# Patient Record
Sex: Female | Born: 1945
Health system: Southern US, Community
[De-identification: ages and names within clinical notes are randomized; demographics above are authoritative.]

## PROBLEM LIST (undated history)

## (undated) DIAGNOSIS — H269 Unspecified cataract: Secondary | ICD-10-CM

## (undated) DIAGNOSIS — A389 Scarlet fever, uncomplicated: Secondary | ICD-10-CM

## (undated) DIAGNOSIS — E039 Hypothyroidism, unspecified: Secondary | ICD-10-CM

## (undated) DIAGNOSIS — E785 Hyperlipidemia, unspecified: Secondary | ICD-10-CM

## (undated) DIAGNOSIS — I10 Essential (primary) hypertension: Secondary | ICD-10-CM

## (undated) DIAGNOSIS — I639 Cerebral infarction, unspecified: Secondary | ICD-10-CM

## (undated) HISTORY — PX: TUBAL LIGATION: SHX77

## (undated) HISTORY — PX: EYE SURGERY: SHX253

## (undated) HISTORY — DX: Unspecified cataract: H26.9

## (undated) HISTORY — DX: Essential (primary) hypertension: I10

## (undated) HISTORY — DX: Scarlet fever, uncomplicated: A38.9

## (undated) HISTORY — DX: Hyperlipidemia, unspecified: E78.5

## (undated) HISTORY — DX: Hypothyroidism, unspecified: E03.9

## (undated) HISTORY — DX: Cerebral infarction, unspecified: I63.9

---

## 1973-08-10 HISTORY — PX: BREAST SURGERY: SHX581

## 1976-08-10 HISTORY — PX: BREAST CYST ASPIRATION: SHX578

## 2008-01-11 ENCOUNTER — Inpatient Hospital Stay: Payer: Self-pay | Admitting: Internal Medicine

## 2008-01-11 ENCOUNTER — Other Ambulatory Visit: Payer: Self-pay

## 2008-01-24 ENCOUNTER — Ambulatory Visit: Payer: Self-pay | Admitting: Internal Medicine

## 2008-01-30 ENCOUNTER — Encounter: Payer: Self-pay | Admitting: Internal Medicine

## 2008-02-02 ENCOUNTER — Ambulatory Visit: Payer: Self-pay | Admitting: Internal Medicine

## 2008-02-08 ENCOUNTER — Encounter: Payer: Self-pay | Admitting: Internal Medicine

## 2008-04-24 ENCOUNTER — Ambulatory Visit: Payer: Self-pay | Admitting: Internal Medicine

## 2008-08-08 ENCOUNTER — Ambulatory Visit: Payer: Self-pay | Admitting: Internal Medicine

## 2008-09-18 ENCOUNTER — Ambulatory Visit: Payer: Self-pay | Admitting: Internal Medicine

## 2009-01-08 DIAGNOSIS — I639 Cerebral infarction, unspecified: Secondary | ICD-10-CM

## 2009-01-08 HISTORY — DX: Cerebral infarction, unspecified: I63.9

## 2009-02-05 ENCOUNTER — Other Ambulatory Visit: Payer: Self-pay | Admitting: Internal Medicine

## 2009-02-05 ENCOUNTER — Ambulatory Visit: Payer: Self-pay | Admitting: Internal Medicine

## 2010-01-08 LAB — HM MAMMOGRAPHY: HM Mammogram: NEGATIVE

## 2010-01-27 ENCOUNTER — Ambulatory Visit: Payer: Self-pay | Admitting: Internal Medicine

## 2010-02-18 ENCOUNTER — Ambulatory Visit: Payer: Self-pay | Admitting: Internal Medicine

## 2010-03-06 ENCOUNTER — Ambulatory Visit: Payer: Self-pay | Admitting: Internal Medicine

## 2010-07-29 ENCOUNTER — Other Ambulatory Visit: Payer: Self-pay | Admitting: Internal Medicine

## 2011-01-26 ENCOUNTER — Other Ambulatory Visit: Payer: Self-pay | Admitting: Internal Medicine

## 2011-01-26 LAB — HM PAP SMEAR: HM Pap smear: NORMAL

## 2011-03-03 ENCOUNTER — Ambulatory Visit: Payer: Self-pay | Admitting: Internal Medicine

## 2011-04-29 ENCOUNTER — Other Ambulatory Visit: Payer: Self-pay | Admitting: Internal Medicine

## 2011-04-30 ENCOUNTER — Other Ambulatory Visit: Payer: Self-pay | Admitting: *Deleted

## 2011-05-01 MED ORDER — LEVOTHYROXINE SODIUM 50 MCG PO CAPS: 50.0000 ug | ORAL_CAPSULE | Freq: Every day | ORAL | Status: DC

## 2011-05-01 MED ORDER — LEVOTHYROXINE SODIUM 50 MCG PO TABS: 50.0000 ug | ORAL_TABLET | Freq: Every day | ORAL | Status: DC

## 2011-07-08 ENCOUNTER — Other Ambulatory Visit: Payer: Self-pay | Admitting: Internal Medicine

## 2011-07-08 MED ORDER — ATORVASTATIN CALCIUM 20 MG PO TABS: 20.0000 mg | ORAL_TABLET | Freq: Every day | ORAL | Status: DC

## 2011-10-02 ENCOUNTER — Telehealth: Payer: Self-pay | Admitting: Internal Medicine

## 2011-10-02 MED ORDER — LEVOTHYROXINE SODIUM 50 MCG PO TABS: 50.0000 ug | ORAL_TABLET | Freq: Every day | ORAL | Status: DC

## 2011-10-02 NOTE — Telephone Encounter (Signed)
[phone] Pt came in and wanted to get refill on her synthroid 50 mic 1 daily [facility] Pt will call back to schedule an appointment

## 2011-10-02 NOTE — Telephone Encounter (Signed)
Patient notified. She is okay to wait until Monday rx sent to pharmacy.

## 2011-10-02 NOTE — Telephone Encounter (Signed)
The [facility] pharmacy is closed now, isn't it?   Please ask her to choose another pharmacy for the  Refill if she needs it today  And call her in Synthroid 50 mcg one  tablet daily  #30

## 2011-10-23 ENCOUNTER — Encounter: Payer: Self-pay | Admitting: Internal Medicine

## 2011-10-23 ENCOUNTER — Ambulatory Visit (INDEPENDENT_AMBULATORY_CARE_PROVIDER_SITE_OTHER): Payer: PRIVATE HEALTH INSURANCE | Admitting: Internal Medicine

## 2011-10-23 VITALS — BP 138/70 | HR 80 | Temp 97.9°F | Resp 16 | Ht 65.5 in | Wt 194.5 lb

## 2011-10-23 DIAGNOSIS — E785 Hyperlipidemia, unspecified: Secondary | ICD-10-CM

## 2011-10-23 DIAGNOSIS — I10 Essential (primary) hypertension: Secondary | ICD-10-CM

## 2011-10-23 DIAGNOSIS — G8929 Other chronic pain: Secondary | ICD-10-CM | POA: Insufficient documentation

## 2011-10-23 DIAGNOSIS — M25559 Pain in unspecified hip: Secondary | ICD-10-CM

## 2011-10-23 DIAGNOSIS — M25562 Pain in left knee: Secondary | ICD-10-CM | POA: Insufficient documentation

## 2011-10-23 MED ORDER — LISINOPRIL 40 MG PO TABS: 40.0000 mg | ORAL_TABLET | Freq: Every day | ORAL | Status: DC

## 2011-10-23 MED ORDER — ATORVASTATIN CALCIUM 20 MG PO TABS: 20.0000 mg | ORAL_TABLET | Freq: Every day | ORAL | Status: DC

## 2011-10-23 NOTE — Progress Notes (Deleted)
  Subjective:    Patient ID: Alexandra Copeland, female    DOB: 1945-08-13, 66 y.o.   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 A  Cc bilalteral hp and knee pain, prior right hip and left knee trauma but recent fall on Jan 1  with right elbow and left knee trauma, both  xrayed and normal but pain ws radiating down left leg , now resolved.      Review of Systems     Objective:   Physical Exam        Assessment & Plan:

## 2011-10-23 NOTE — Assessment & Plan Note (Addendum)
aggaravated by statin and weight gain.  Increase joint juice to  Bid ,  Suspend lipitor,  Review carotid dopplers and ECHO  in 2009 sicne CVA was due to hypertensive urgency.  If her pain is alleviated we will resume Lipitor at the pain returns we will attribute the pain to use a statin therapy. We will then need to discuss  the risks and benefits of discontinuing this based on repeat lipid panel off of statins.

## 2011-10-23 NOTE — Patient Instructions (Signed)
Consider the Low Glycemic Index Diet and 6 smaller meals daily :   7 AM Low carbohydrate Protein  Shakes (EAS Carb Control  Or Atkins ,  Available everywhere,   In  cases at BJs )  2.5 carbs  (Add or substitute a toasted sandwhich thin w/ peanut butter)  10 AM: Protein bar by Atkins (snack size,  Chocolate lover's variety at  BJ's)    Lunch: sandwich on pita bread or flatbread (Joseph's makes a pita bread and a flat bread , available at [employer] and BJ's; Toufayah makes a low carb flatbread available at [employer] and [employer])   3 PM:  Mid day :  Another protein bar,  Or a  cheese stick, 1/4 cup of almonds, walnuts, pistachios, pecans, peanuts,  Macadamia nuts  6 PM  Dinner:  "mean and green:"  Meat/chicken/fish, salad, and green veggie : use ranch, vinagrette,  Blue cheese, etc  9 PM snack : Breyer's low carb fudgiscle or  ice cream bar (Carb Smart) Weight Watcher's ice cream bar , or another protein shake  ------------------------------------------  Suspend the lipitor for 6 weeks. And increase joint juice to twice daily  If the joints are better,  We will consider an alternative statin.  If no better,  Resume lipitor

## 2011-10-25 ENCOUNTER — Encounter: Payer: Self-pay | Admitting: Internal Medicine

## 2011-10-25 DIAGNOSIS — E785 Hyperlipidemia, unspecified: Secondary | ICD-10-CM | POA: Insufficient documentation

## 2011-10-25 DIAGNOSIS — E1169 Type 2 diabetes mellitus with other specified complication: Secondary | ICD-10-CM | POA: Insufficient documentation

## 2011-10-25 DIAGNOSIS — I10 Essential (primary) hypertension: Secondary | ICD-10-CM | POA: Insufficient documentation

## 2011-10-25 NOTE — Assessment & Plan Note (Signed)
She has been taking Lipitor since her stroke. Her stroke  was due to heart hypertensive urgency, so  her LDL goal would be between 70 and 100, but she does wonder whether her car chronic joint pains are aggravated by statin therapy. See discussion below.

## 2011-10-25 NOTE — Progress Notes (Signed)
Patient ID: Alexandra Copeland, female   DOB: 03-15-1946, 66 y.o.   MRN: [id number]  Patient Active Problem List  Diagnoses  . Chronic arthralgias of knees and hips  . Hypertension  . Hyperlipidemia  . Stroke    Subjective:  CC:  Joint pain, 6 month followup  Chief Complaint  Patient presents with  . Annual Exam    HPI:   Alexandra Copeland a 66 y.o. female who presents for six-month followup on  chronic tobacco abuse, hypertension hyperlipidemia hypothyroidism and osteoarthritis. She was last seen 9 months ago at her annual exam at which time a Pap smear was done and a mammogram was ordered.  Both were normal.   in the interim she has had no trouble managing her hypertension and hyperlpidemia with current medications. She has had no subsequent strokes since her stroke in 2010. 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 She had a traumatic fall in January while playing with her granddaughter. The fall occurred while running down a snowy hill. Her fall was broken by a parked car she skinned up her left knee and bumped her right hip. Both joints were very tender with decreased range of motion.  both joints were x-rayed upon her return home and were negative for fractures or any joint derangement necessitating orthopedic followup. She had pain and limited range of motion for several weeks but the symptoms are now resolved. She h she does have chronic right hip pain since childhood which she tolerates with occasional use of a proton .  She has had no others issues specifically no episodes of chest pain palpitations dizziness orthopnea low back pain trouble sleeping, or mood changes.       Review of Systems:  Pertinent review of systems addressed in the HPI,    The following portions of the patient's history were reviewed and updated as appropriate:  Allergies, current medications, and problem list.  Past Medical History  Diagnosis Date  . Scarlet fever     as a child  . Hypertension   . Hyperlipidemia   . Stroke June 2010    left parietal hypertensive  . Hypothyroid    Current Outpatient Prescriptions on File Prior to Visit  Medication Sig Dispense Refill  . levothyroxine (SYNTHROID) 50 MCG tablet Take 1 tablet (50 mcg total) by mouth daily.  30 tablet  3   History   Social History  . Marital Status: Single    Spouse Name: N/A    Number of Children: N/A  . Years of Education: N/A   Occupational History  . Not on file.   Social History Main Topics  . Smoking status: Current Some Day Smoker    Types: Cigarettes    Last Attempt to Quit: 01/07/2009  . Smokeless tobacco: Never Used  . Alcohol Use: 0.6 oz/week    1 Glasses of wine per week  . Drug Use: No  . Sexually Active: No   Other Topics Concern  . Not on file   Social History Narrative  . No narrative on file    Objective:  BP 138/70  Pulse 80  Temp(Src) 97.9 F (36.6 C) (Oral)  Resp 16  Ht 5' 5.5" (1.664 m)  Wt 194 lb 8 oz (88.225 kg)  BMI 31.87 kg/m2  SpO2 98%  General:  Well-developed, well-nourished. No acute distress General appearance:  alert, cooperative and appears stated age Eyes: conjunctivae/corneas clear. PERRL, EOM's intact. Fundi benign. Ears: normal TM's and external ear canals both ears Throat: lips, mucosa, and tongue normal; teeth and gums normal Back: symmetric, no curvature. ROM normal. No CVA tenderness. Lungs: clear to auscultation bilaterally Heart: regular rate and rhythm, S1, S2 normal, no murmur, click, rub or gallop Abdomen: soft, non-tender; bowel sounds normal; no masses,  no organomegaly Extremities: extremities normal, atraumatic, no cyanosis or edema   Assessment:  Chronic arthralgias of knees and hips aggaravated by statin and weight gain.  Increase joint juice to  Bid ,  Suspend lipitor,  Review carotid  dopplers and ECHO  in 2009 sicne CVA was due to hypertensive urgency.  If her pain is alleviated we will resume Lipitor at the pain returns we will attribute the pain to use a statin therapy. We will then need to discuss  the risks and benefits of discontinuing this based on repeat lipid panel off of statins.  Hypertension Well controlled on current medications.  No changes today.  Hyperlipidemia She has been taking Lipitor since her stroke. Her stroke  was due to heart hypertensive urgency, so  her LDL goal would be between 70 and 100, but she does wonder whether her car chronic joint pains are aggravated by statin therapy. See discussion below.    Updated Medication List Outpatient Encounter Prescriptions as of 10/23/2011  Medication Sig Dispense Refill  . aspirin 325 MG tablet Take 325 mg by mouth daily.      Marland Kitchen atorvastatin (LIPITOR) 20 MG tablet Take 1 tablet (20 mg total) by mouth daily.  90 tablet  3  . cholecalciferol (VITAMIN D) 1000 UNITS tablet Take 1,000 Units by mouth daily.      Marland Kitchen levothyroxine (SYNTHROID) 50 MCG tablet Take 1 tablet (50 mcg total) by mouth daily.  30 tablet  3  . lisinopril (PRINIVIL,ZESTRIL) 40 MG tablet Take 1 tablet (40 mg total) by mouth daily.  90 tablet  3  . Multiple Vitamin (MULTIVITAMIN) tablet Take 1 tablet by mouth daily.      Marland Kitchen DISCONTD: atorvastatin (LIPITOR) 20 MG tablet Take 1 tablet (20 mg total) by mouth daily.  30 tablet  5  . DISCONTD: lisinopril (PRINIVIL,ZESTRIL) 40 MG tablet Take 40 mg by mouth daily.      Marland Kitchen DISCONTD: Levothyroxine Sodium 50 MCG CAPS Take 1 capsule (50 mcg total) by mouth daily.  30 capsule  3

## 2011-10-25 NOTE — Assessment & Plan Note (Signed)
Well controlled on current medications.  No changes today. 

## 2011-10-25 NOTE — Progress Notes (Deleted)
Patient ID: Alexandra Copeland, female   DOB: 03-Sep-1945, 66 y.o.   MRN: [id number]  Patient Active Problem List  Diagnoses  . Chronic arthralgias of knees and hips  . Hypertension  . Hyperlipidemia  . Stroke    Subjective:  CC:   Chief Complaint  Patient presents with  . Annual Exam    HPI:   Alexandra Copeland a 66 y.o. female who presents  Review of Systems:  Pertinent review of systems addressed in the HPI,    The following portions of the patient's history were reviewed and updated as appropriate: Allergies, current medications, and problem list.  History   Social History Narrative  . No narrative on file    Objective:  BP 138/70  Pulse 80  Temp(Src) 97.9 F (36.6 C) (Oral)  Resp 16  Ht 5' 5.5" (1.664 m)  Wt 194 lb 8 oz (88.225 kg)  BMI 31.87 kg/m2  SpO2 98%  General:  Well-developed, well-nourished. No acute distress {female exam, choose systems:17926}   Assessment:  1. Chronic arthralgias of knees and hips     Plan:  Orders Placed This Encounter  Procedures  . HM MAMMOGRAPHY  . HM PAP SMEAR    No Follow-up on file.   Medications:   Current Outpatient Prescriptions  Medication Sig Dispense Refill  . aspirin 325 MG tablet Take 325 mg by mouth daily.      Marland Kitchen atorvastatin (LIPITOR) 20 MG tablet Take 1 tablet (20 mg total) by mouth daily.  90 tablet  3  . cholecalciferol (VITAMIN D) 1000 UNITS tablet Take 1,000 Units by mouth daily.      Marland Kitchen levothyroxine (SYNTHROID) 50 MCG tablet Take 1 tablet (50 mcg total) by mouth daily.  30 tablet  3  . lisinopril (PRINIVIL,ZESTRIL) 40 MG tablet Take 1 tablet (40 mg total) by mouth daily.  90 tablet  3  . Multiple Vitamin (MULTIVITAMIN) tablet Take 1 tablet by mouth daily.

## 2011-10-29 ENCOUNTER — Telehealth: Payer: Self-pay | Admitting: Internal Medicine

## 2011-10-29 NOTE — Telephone Encounter (Signed)
i RFECIEVED HER [place] labs.  Her LDL is above goal given her history of CVA.  At 108, (goal is around 96) so I would not advise stopping the lipitor except for 6 weeks to see if her joint and muscle pains go away.  If they do away , we should consider a trial of crestor instead of lipitor to see if she tolerates it any better. . We can give her samples and schedule a followup fasting lipid, CMET and total CK after 6 weeks of therapy.

## 2011-10-30 ENCOUNTER — Encounter: Payer: Self-pay | Admitting: Internal Medicine

## 2011-10-30 NOTE — Telephone Encounter (Signed)
Patient notified of results.  She will call back after the 6 weeks of not taking the Lipitor

## 2011-10-30 NOTE — Telephone Encounter (Signed)
Left message asking patient to return my call.

## 2012-04-22 ENCOUNTER — Other Ambulatory Visit: Payer: Self-pay | Admitting: General Practice

## 2012-04-22 ENCOUNTER — Other Ambulatory Visit: Payer: Self-pay | Admitting: Internal Medicine

## 2012-04-22 MED ORDER — LEVOTHYROXINE SODIUM 50 MCG PO TABS: 50.0000 ug | ORAL_TABLET | Freq: Every day | ORAL | Status: DC

## 2012-04-27 ENCOUNTER — Ambulatory Visit: Payer: Self-pay | Admitting: Internal Medicine

## 2012-04-27 DIAGNOSIS — Z1231 Encounter for screening mammogram for malignant neoplasm of breast: Secondary | ICD-10-CM | POA: Diagnosis not present

## 2012-05-01 ENCOUNTER — Encounter: Payer: Self-pay | Admitting: Internal Medicine

## 2012-05-04 ENCOUNTER — Ambulatory Visit (INDEPENDENT_AMBULATORY_CARE_PROVIDER_SITE_OTHER): Payer: Medicare Other | Admitting: Internal Medicine

## 2012-05-04 ENCOUNTER — Encounter: Payer: Self-pay | Admitting: Internal Medicine

## 2012-05-04 VITALS — BP 146/70 | HR 64 | Temp 98.0°F | Resp 18 | Wt 189.8 lb

## 2012-05-04 DIAGNOSIS — E039 Hypothyroidism, unspecified: Secondary | ICD-10-CM

## 2012-05-04 DIAGNOSIS — E785 Hyperlipidemia, unspecified: Secondary | ICD-10-CM

## 2012-05-04 DIAGNOSIS — M25559 Pain in unspecified hip: Secondary | ICD-10-CM

## 2012-05-04 DIAGNOSIS — Z79899 Other long term (current) drug therapy: Secondary | ICD-10-CM

## 2012-05-04 DIAGNOSIS — M79609 Pain in unspecified limb: Secondary | ICD-10-CM

## 2012-05-04 DIAGNOSIS — I10 Essential (primary) hypertension: Secondary | ICD-10-CM

## 2012-05-04 DIAGNOSIS — M79621 Pain in right upper arm: Secondary | ICD-10-CM

## 2012-05-04 DIAGNOSIS — G8929 Other chronic pain: Secondary | ICD-10-CM

## 2012-05-04 DIAGNOSIS — F17200 Nicotine dependence, unspecified, uncomplicated: Secondary | ICD-10-CM

## 2012-05-04 DIAGNOSIS — M25562 Pain in left knee: Secondary | ICD-10-CM

## 2012-05-04 DIAGNOSIS — Z716 Tobacco abuse counseling: Secondary | ICD-10-CM

## 2012-05-04 DIAGNOSIS — Z7189 Other specified counseling: Secondary | ICD-10-CM

## 2012-05-04 MED ORDER — ATORVASTATIN CALCIUM 40 MG PO TABS: 20.0000 mg | ORAL_TABLET | Freq: Every day | ORAL | Status: DC

## 2012-05-04 MED ORDER — VARENICLINE TARTRATE 0.5 MG X 11 & 1 MG X 42 PO MISC: ORAL | Status: DC

## 2012-05-04 NOTE — Assessment & Plan Note (Signed)
She has resumed Lipitor for management of hyperlipidemia and will return for fasting lipids in a few weeks.

## 2012-05-04 NOTE — Assessment & Plan Note (Signed)
Use of Chantix describes today. Had adverse effects listed.

## 2012-05-04 NOTE — Assessment & Plan Note (Signed)
Her exam is normal. Her pain description suggests more of a bone spur on the humeral head  than a rotator cuff injury. It is not limiting her activities and there are no abnormalities on exam. We discussed is watching it for now and using nonsteroidal anti-inflammatories if needed.

## 2012-05-04 NOTE — Assessment & Plan Note (Signed)
Blood pressures are typically less than 140/80. No changes today.

## 2012-05-04 NOTE — Progress Notes (Signed)
Patient ID: Alexandra Copeland, female   DOB: [birth year], 66 y.o.   MRN: [id number]  Patient Active Problem List  Diagnosis  . Chronic arthralgias of knees and hips  . Hypertension  . Hyperlipidemia  . Stroke  . Tobacco abuse counseling  . Pain of right upper arm    Subjective:  CC:   Chief Complaint  Patient presents with  . Follow-up    HPI:   Alexandra Copeland a 66 y.o. female who presents 6 month follow up on chronic conditions including hypertension hyperlipidemia history of prior stroke and continued tobacco abuse. She officially retired August 6 and has been having a great summer. She had a great trip to [place] with her daughters to visit her son. Muscle insertion a 5 hour cruise and saw also to [place] and shocks. Since her last visit she has resumed her resume her Lipitor since her muscle aches did not improve after several weeks of suspension. She has noted no new new problems with pain. She does have left hip pain which is brought on by inactivity and improves with activity a previously noted right elbow cyst which began after she had fallen on that elbow has resolved. She still feels like she may have chipped that elbow but there is nothing to be done about it. Finally she is reporting right shoulder pain which is felt more profoundly in the triceps area when she abducts her arm and externally rotates it. It is not limiting her activities in any way. She does not want any workup for it at this time. Last problem does desire for tobacco cessation. She is wanting to try Chantix. Marland Kitchen     Past Medical History  Diagnosis Date  . Scarlet fever     as a child  . Hypertension   . Hyperlipidemia   . Stroke June 2010    left parietal hypertensive  . Hypothyroid     Past Surgical History  Procedure Date  . Breast surgery 1975    abscess drained     The following portions of the patient's history were reviewed and updated as appropriate: Allergies, current medications, and problem  list.    Review of Systems:   12 Pt  review of systems was negative except for left hip pain, right upper arm pain.    History   Social History  . Marital Status: Single    Spouse Name: N/A    Number of Children: N/A  . Years of Education: N/A   Occupational History  . Not on file.   Social History Main Topics  . Smoking status: Current Some Day Smoker    Types: Cigarettes    Last Attempt to Quit: 01/07/2009  . Smokeless tobacco: Never Used  . Alcohol Use: 0.6 oz/week    1 Glasses of wine per week  . Drug Use: No  . Sexually Active: No   Other Topics Concern  . Not on file   Social History Narrative  . No narrative on file    Objective:  BP 146/70  Pulse 64  Temp 98 F (36.7 C) (Oral)  Resp 18  Wt 189 lb 12 oz (86.07 kg)  SpO2 97%  General appearance: alert, cooperative and appears stated age Ears: normal TM's and external ear canals both ears Throat: lips, mucosa, and tongue normal; teeth and gums normal Neck: no adenopathy, no carotid bruit, supple, symmetrical, trachea midline and thyroid not enlarged, symmetric, no tenderness/mass/nodules Back: symmetric, no curvature. ROM normal. No CVA tenderness. Lungs:  clear to auscultation bilaterally Heart: regular rate and rhythm, S1, S2 normal, no murmur, click, rub or gallop Abdomen: soft, non-tender; bowel sounds normal; no masses,  no organomegaly Pulses: 2+ and symmetric Skin: Skin color, texture, turgor normal. No rashes or lesions Lymph nodes: Cervical, supraclavicular, and axillary nodes normal.  Assessment and Plan:  Hypertension Blood pressures are typically less than 140/80. No changes today.  Chronic arthralgias of knees and hips Her symptoms are not aggravated by statin use and she has resumed it. Continue when necessary use of Vicoprofen and Tylenol.  Hyperlipidemia She has resumed Lipitor for management of hyperlipidemia and will return for fasting lipids in a few weeks.  Tobacco abuse  counseling Use of Chantix describes today. Had adverse effects listed.  Pain of right upper arm Her exam is normal. Her pain description suggests more of a bone spur on the humeral head  than a rotator cuff injury. It is not limiting her activities and there are no abnormalities on exam. We discussed is watching it for now and using nonsteroidal anti-inflammatories if needed.   Updated Medication List Outpatient Encounter Prescriptions as of 05/04/2012  Medication Sig Dispense Refill  . aspirin 325 MG tablet Take 325 mg by mouth daily.      . cholecalciferol (VITAMIN D) 1000 UNITS tablet Take 1,000 Units by mouth daily.      Marland Kitchen levothyroxine (SYNTHROID) 50 MCG tablet Take 1 tablet (50 mcg total) by mouth daily.  30 tablet  3  . lisinopril (PRINIVIL,ZESTRIL) 40 MG tablet Take 1 tablet (40 mg total) by mouth daily.  90 tablet  3  . Multiple Vitamin (MULTIVITAMIN) tablet Take 1 tablet by mouth daily.      Marland Kitchen DISCONTD: atorvastatin (LIPITOR) 20 MG tablet Take 1 tablet (20 mg total) by mouth daily.  90 tablet  3  . atorvastatin (LIPITOR) 40 MG tablet Take 0.5 tablets (20 mg total) by mouth daily.  30 tablet  3  . varenicline (CHANTIX STARTING MONTH PAK) 0.5 MG X 11 & 1 MG X 42 tablet Take one 0.5 mg tablet by mouth once daily for 3 days, then increase to one 0.5 mg tablet twice daily for 4 days, then increase to one 1 mg tablet twice daily.  53 tablet  0     Orders Placed This Encounter  Procedures  . Lipid panel  . Comprehensive metabolic panel  . TSH    No Follow-up on file.

## 2012-05-04 NOTE — Patient Instructions (Addendum)
Return for fasting labs.

## 2012-05-04 NOTE — Assessment & Plan Note (Signed)
Her symptoms are not aggravated by statin use and she has resumed it. Continue when necessary use of Vicoprofen and Tylenol.

## 2012-05-05 ENCOUNTER — Other Ambulatory Visit: Payer: Medicare Other

## 2012-05-05 ENCOUNTER — Other Ambulatory Visit (INDEPENDENT_AMBULATORY_CARE_PROVIDER_SITE_OTHER): Payer: Medicare Other

## 2012-05-05 DIAGNOSIS — E785 Hyperlipidemia, unspecified: Secondary | ICD-10-CM

## 2012-05-05 DIAGNOSIS — E039 Hypothyroidism, unspecified: Secondary | ICD-10-CM | POA: Diagnosis not present

## 2012-05-05 DIAGNOSIS — Z79899 Other long term (current) drug therapy: Secondary | ICD-10-CM | POA: Diagnosis not present

## 2012-05-05 LAB — LIPID PANEL
Cholesterol: 219 mg/dL — ABNORMAL HIGH (ref 0–200)
HDL: 44.8 mg/dL (ref >39.00)
VLDL: 36.4 mg/dL (ref 0.0–40.0)

## 2012-05-05 LAB — FECAL OCCULT BLOOD, IMMUNOCHEMICAL: Fecal Occult Bld: NEGATIVE

## 2012-05-05 LAB — COMPREHENSIVE METABOLIC PANEL
AST: 21 U/L (ref 0–37)
BUN: 14 mg/dL (ref 6–23)
Calcium: 9 mg/dL (ref 8.4–10.5)
Chloride: 105 mEq/L (ref 96–112)
Creatinine, Ser: 0.8 mg/dL (ref 0.4–1.2)

## 2012-05-05 LAB — LDL CHOLESTEROL, DIRECT: Direct LDL: 138.7 mg/dL

## 2012-05-05 LAB — TSH: TSH: 1.47 u[IU]/mL (ref 0.35–5.50)

## 2012-05-05 NOTE — Addendum Note (Signed)
Addended by: Mauri Reading on: 05/05/2012 09:27 AM   Modules accepted: Orders

## 2012-05-09 ENCOUNTER — Encounter: Payer: Self-pay | Admitting: Internal Medicine

## 2012-08-29 ENCOUNTER — Other Ambulatory Visit: Payer: Self-pay | Admitting: Internal Medicine

## 2012-08-30 NOTE — Telephone Encounter (Signed)
Med filled.  

## 2012-09-24 ENCOUNTER — Other Ambulatory Visit: Payer: Self-pay

## 2012-10-11 ENCOUNTER — Other Ambulatory Visit: Payer: Self-pay | Admitting: Internal Medicine

## 2012-10-11 DIAGNOSIS — Z79899 Other long term (current) drug therapy: Secondary | ICD-10-CM

## 2012-10-11 DIAGNOSIS — M25579 Pain in unspecified ankle and joints of unspecified foot: Secondary | ICD-10-CM

## 2012-10-11 DIAGNOSIS — E785 Hyperlipidemia, unspecified: Secondary | ICD-10-CM

## 2012-10-12 NOTE — Telephone Encounter (Signed)
Received refill request electronically from pharmacy. Please advise number of refills.

## 2012-10-14 NOTE — Telephone Encounter (Signed)
Last message to patient in September advised repeat labs in 6 weeks which she has not done  Needs fasting lipids and CMET ASAP.  Refill for 30 days only,

## 2012-10-17 NOTE — Telephone Encounter (Signed)
Patient notified as instructed by telephone. Lab appointment scheduled for Tuesday. Patient request that a RA factor be drawn too because of her joint pain if Dr. Darrick Huntsman agrees.

## 2012-10-18 ENCOUNTER — Other Ambulatory Visit (INDEPENDENT_AMBULATORY_CARE_PROVIDER_SITE_OTHER): Payer: Medicare Other

## 2012-10-18 DIAGNOSIS — E785 Hyperlipidemia, unspecified: Secondary | ICD-10-CM | POA: Diagnosis not present

## 2012-10-18 DIAGNOSIS — Z79899 Other long term (current) drug therapy: Secondary | ICD-10-CM | POA: Diagnosis not present

## 2012-10-18 LAB — COMPREHENSIVE METABOLIC PANEL
ALT: 20 U/L (ref 0–35)
AST: 24 U/L (ref 0–37)
Alkaline Phosphatase: 84 U/L (ref 39–117)
Calcium: 9.2 mg/dL (ref 8.4–10.5)
Chloride: 104 mEq/L (ref 96–112)
Creatinine, Ser: 0.9 mg/dL (ref 0.4–1.2)
Total Bilirubin: 0.5 mg/dL (ref 0.3–1.2)

## 2012-10-18 LAB — LIPID PANEL
Cholesterol: 247 mg/dL — ABNORMAL HIGH (ref 0–200)
VLDL: 37.2 mg/dL (ref 0.0–40.0)

## 2012-10-19 ENCOUNTER — Encounter: Payer: Self-pay | Admitting: Internal Medicine

## 2012-10-20 ENCOUNTER — Encounter: Payer: Self-pay | Admitting: Internal Medicine

## 2012-10-20 DIAGNOSIS — Z79899 Other long term (current) drug therapy: Secondary | ICD-10-CM

## 2012-10-20 DIAGNOSIS — E785 Hyperlipidemia, unspecified: Secondary | ICD-10-CM

## 2012-10-20 MED ORDER — ATORVASTATIN CALCIUM 80 MG PO TABS: 80.0000 mg | ORAL_TABLET | Freq: Every day | ORAL | Status: DC

## 2012-10-20 MED ORDER — ATORVASTATIN CALCIUM 80 MG PO TABS: ORAL_TABLET | ORAL | Status: DC

## 2012-11-09 ENCOUNTER — Other Ambulatory Visit: Payer: Self-pay | Admitting: *Deleted

## 2012-11-09 MED ORDER — LEVOTHYROXINE SODIUM 50 MCG PO TABS: ORAL_TABLET | ORAL | Status: DC

## 2012-11-09 NOTE — Telephone Encounter (Signed)
Med filled. Pt needs labs and an ov for additional refills.

## 2012-11-21 ENCOUNTER — Other Ambulatory Visit: Payer: Self-pay | Admitting: Internal Medicine

## 2012-11-22 NOTE — Telephone Encounter (Signed)
Spoke with [facility] [address] ok to use another manufacturer for her Synthroid per Dr. Darrick Huntsman.

## 2012-11-28 ENCOUNTER — Encounter: Payer: Self-pay | Admitting: Internal Medicine

## 2012-11-29 ENCOUNTER — Other Ambulatory Visit: Payer: Self-pay | Admitting: Internal Medicine

## 2012-11-29 MED ORDER — LISINOPRIL 40 MG PO TABS: 40.0000 mg | ORAL_TABLET | Freq: Every day | ORAL | Status: DC

## 2012-11-29 NOTE — Telephone Encounter (Signed)
Rx sent to pharmacy by escript for one month. Called and left message for pt to schedule an appt for followup.

## 2012-12-27 ENCOUNTER — Encounter: Payer: Self-pay | Admitting: Internal Medicine

## 2012-12-28 ENCOUNTER — Encounter: Payer: Self-pay | Admitting: Internal Medicine

## 2012-12-28 ENCOUNTER — Ambulatory Visit (INDEPENDENT_AMBULATORY_CARE_PROVIDER_SITE_OTHER): Payer: Medicare Other | Admitting: Internal Medicine

## 2012-12-28 ENCOUNTER — Other Ambulatory Visit: Payer: Medicare Other

## 2012-12-28 VITALS — BP 144/70 | HR 69 | Temp 98.2°F | Resp 16 | Wt 203.0 lb

## 2012-12-28 DIAGNOSIS — I639 Cerebral infarction, unspecified: Secondary | ICD-10-CM

## 2012-12-28 DIAGNOSIS — F17200 Nicotine dependence, unspecified, uncomplicated: Secondary | ICD-10-CM

## 2012-12-28 DIAGNOSIS — Z7189 Other specified counseling: Secondary | ICD-10-CM

## 2012-12-28 DIAGNOSIS — M778 Other enthesopathies, not elsewhere classified: Secondary | ICD-10-CM

## 2012-12-28 DIAGNOSIS — I10 Essential (primary) hypertension: Secondary | ICD-10-CM

## 2012-12-28 DIAGNOSIS — M71541 Other bursitis, not elsewhere classified, right hand: Secondary | ICD-10-CM

## 2012-12-28 DIAGNOSIS — I6350 Cerebral infarction due to unspecified occlusion or stenosis of unspecified cerebral artery: Secondary | ICD-10-CM

## 2012-12-28 DIAGNOSIS — Z716 Tobacco abuse counseling: Secondary | ICD-10-CM

## 2012-12-28 MED ORDER — DICLOFENAC SODIUM 75 MG PO TBEC: 75.0000 mg | DELAYED_RELEASE_TABLET | Freq: Two times a day (BID) | ORAL | Status: DC

## 2012-12-28 MED ORDER — VARENICLINE TARTRATE 0.5 MG X 11 & 1 MG X 42 PO MISC: ORAL | Status: DC

## 2012-12-28 MED ORDER — PANTOPRAZOLE SODIUM 40 MG PO TBEC: 40.0000 mg | DELAYED_RELEASE_TABLET | Freq: Every day | ORAL | Status: DC

## 2012-12-28 NOTE — Assessment & Plan Note (Addendum)
She Quit smoking for 3 months with use of chantix,  But Started smoking again in December . Encouragement provided.

## 2012-12-28 NOTE — Patient Instructions (Addendum)
You have tendonitis of the left thumb from all of your yard work.  Take the diclofenac twice daily for a minimum of two weeks and the protonix daily in the AM to prortect stomach  No pruning for two weeks !!   Ice for 15 minutes after use of thumb    This is  One version of a  "Low GI"  Diet:  It will still lower your blood sugars and allow you to lose 4 to 8  lbs  per month if you follow it carefully.  Your goal with exercise is a minimum of 30 minutes of aerobic exercise 5 days per week (Walking does not count once it becomes easy!)    All of the foods can be found at grocery stores and in bulk at [employer].  The Atkins protein bars and shakes are available in more varieties at Target, WalMart and [employer].     7 AM Breakfast:  Choose from the following:  Low carbohydrate Protein  Shakes (I recommend the [employer]AS [employer]vantEdge "Carb Control" shakes  Or the low carb shakes by Atkins.    2.5 carbs   Arnold's "Sandwhich Thin"toasted  w/ peanut butter (no jelly: about 20 net carbs  "Bagel Thin" with cream cheese and salmon: about 20 carbs   a scrambled egg/bacon/cheese burrito made with Mission's "carb balance" whole wheat tortilla  (about 10 net carbs )   Avoid cereal and bananas, oatmeal and cream of wheat and grits. They are loaded with carbohydrates!   10 AM: high protein snack  Protein bar by Atkins (the snack size, under 200 cal, usually < 6 net carbs).    A stick of cheese:  Around 1 carb,  100 cal     Dannon Light n Fit [place] Yogurt  (80 cal, 8 carbs)  Other so called "protein bars" and Greek yogurts tend to be loaded with carbohydrates.  Remember, in food advertising, the word "energy" is synonymous for " carbohydrate."  Lunch:   A Sandwich using the bread choices listed, Can use any  Eggs,  lunchmeat, grilled meat or canned tuna), avocado, regular mayo/mustard  and cheese.  A Salad using blue cheese, ranch,  Goddess or vinagrette,  No croutons or "confetti" and no "candied nuts"  but regular nuts OK.   No pretzels or chips.  Pickles and miniature sweet peppers are a good low carb alternative that provide a "crunch"  The bread is the only source of carbohydrate in a sandwich and  can be decreased by trying some of these alternatives to traditional loaf bread  Joseph's makes a pita bread and a flat bread that are 50 cal and 4 net carbs available at BJs and WalMart.  This can be toasted to use with hummous as well  Toufayan makes a low carb flatbread that's 100 cal and 9 net carbs available at [employer] and [employer] makes 2 sizes of  Low carb whole wheat tortilla  (The large one is 210 cal and 6 net carbs) Avoid "Low fat dressings, as well as Reyne Dumas and [address] dressings They are loaded with sugar!   3 PM/ Mid day  Snack:  Consider  1 ounce of  almonds, walnuts, pistachios, pecans, peanuts,  Macadamia nuts or a nut medley.  Avoid "granola"; the dried cranberries and raisins are loaded with carbohydrates. Mixed nuts as long as there are no raisins,  cranberries or dried fruit.     6 PM  Dinner:  Meat/fowl/fish with a green salad, and either broccoli, cauliflower, green beans, spinach, brussel sprouts or  Lima beans. DO NOT BREAD THE PROTEIN!!      There is a low carb pasta by Dreamfield's that is acceptable and tastes great: only 5 digestible carbs/serving.( All grocery stores but BJs carry it )  Try Kai Levins Angelo's chicken piccata or chicken or eggplant parm over low carb pasta.(Lowes and BJs)   Clifton Custard Sanchez's "Carnitas" (pulled pork, no sauce,  0 carbs) or his beef pot roast to make a dinner burrito (at BJ's)  Pesto over low carb pasta (bj's sells a good quality pesto in the center refrigerated section of the deli   Whole wheat pasta is still full of digestible carbs and  Not as low in glycemic index as Dreamfield's.   Brown rice is still rice,  So skip the rice and noodles if you eat [place] or [place] (or at least limit to 1/2 cup)  9 PM snack :    Breyer's "low carb" fudgsicle or  ice cream bar (Carb Smart line), or  Weight Watcher's ice cream bar , or another "no sugar added" ice cream;  a serving of fresh berries/cherries with whipped cream   Cheese or DANNON'S Ll[place] [place] YOGURT    Avoid bananas, pineapple, grapes  and watermelon on a regular basis because they are high in sugar.  THINK OF THEM AS DESSERT  Remember that snack Substitutions should be less than 10 NET carbs per serving and meals < 20 carbs. Remember to subtract fiber grams to get the "net carbs."

## 2012-12-29 ENCOUNTER — Other Ambulatory Visit (INDEPENDENT_AMBULATORY_CARE_PROVIDER_SITE_OTHER): Payer: Medicare Other

## 2012-12-29 DIAGNOSIS — E785 Hyperlipidemia, unspecified: Secondary | ICD-10-CM

## 2012-12-29 DIAGNOSIS — Z79899 Other long term (current) drug therapy: Secondary | ICD-10-CM

## 2012-12-29 DIAGNOSIS — M25579 Pain in unspecified ankle and joints of unspecified foot: Secondary | ICD-10-CM | POA: Diagnosis not present

## 2012-12-29 LAB — IRON AND TIBC
%SAT: 21 % (ref 20–55)
Iron: 69 ug/dL (ref 42–145)
TIBC: 333 ug/dL (ref 250–470)
UIBC: 264 ug/dL (ref 125–400)

## 2012-12-29 LAB — LIPID PANEL
Cholesterol: 165 mg/dL (ref 0–200)
HDL: 41.9 mg/dL (ref >39.00)
LDL Cholesterol: 90 mg/dL (ref 0–99)
Total CHOL/HDL Ratio: 4
Triglycerides: 167 mg/dL — ABNORMAL HIGH (ref 0.0–149.0)
VLDL: 33.4 mg/dL (ref 0.0–40.0)

## 2012-12-29 LAB — COMPREHENSIVE METABOLIC PANEL WITH GFR
ALT: 25 U/L (ref 0–35)
AST: 26 U/L (ref 0–37)
Albumin: 4 g/dL (ref 3.5–5.2)
Alkaline Phosphatase: 81 U/L (ref 39–117)
BUN: 13 mg/dL (ref 6–23)
CO2: 25 meq/L (ref 19–32)
Calcium: 9.1 mg/dL (ref 8.4–10.5)
Chloride: 105 meq/L (ref 96–112)
Creatinine, Ser: 0.8 mg/dL (ref 0.4–1.2)
GFR: 73.07 mL/min (ref >60.00)
Glucose, Bld: 91 mg/dL (ref 70–99)
Potassium: 4.3 meq/L (ref 3.5–5.1)
Sodium: 138 meq/L (ref 135–145)
Total Bilirubin: 0.6 mg/dL (ref 0.3–1.2)
Total Protein: 6.3 g/dL (ref 6.0–8.3)

## 2012-12-29 LAB — FERRITIN: Ferritin: 25.8 ng/mL (ref 10.0–291.0)

## 2012-12-29 NOTE — Telephone Encounter (Signed)
Labs completed at visit.

## 2012-12-30 ENCOUNTER — Encounter: Payer: Self-pay | Admitting: Internal Medicine

## 2012-12-30 DIAGNOSIS — M71541 Other bursitis, not elsewhere classified, right hand: Secondary | ICD-10-CM | POA: Insufficient documentation

## 2012-12-30 LAB — ANA: Anti Nuclear Antibody(ANA): NEGATIVE

## 2012-12-30 NOTE — Assessment & Plan Note (Signed)
Nearly ten yeas ago  Risk facfors controlled

## 2012-12-30 NOTE — Progress Notes (Signed)
Patient ID: Alexandra Copeland, female   DOB: 10/10/1945, 67 y.o.   MRN: [id number]     Patient Active Problem List   Diagnosis Date Noted  . Other bursitis, not elsewhere classified, right hand 12/30/2012  . Tobacco abuse counseling 05/04/2012  . Pain of right upper arm 05/04/2012  . Hypertension   . Hyperlipidemia   . Chronic arthralgias of knees and hips 10/23/2011  . Stroke 01/08/2009    Subjective:  CC:   Chief Complaint  Patient presents with  . Follow-up    medication refill     HPI:   Onyx Alexandra Copeland a 67 y.o. female who presents 6 month follow up on chronic conditions inclduing hypertension .  Feels well except for right thumb pain which started after a prolonged period of yard work using clippers. Tolerating medications well , but having a hard time due to thumb pain and weakness. Enjoying retirement.    Past Medical History  Diagnosis Date  . Scarlet fever     as a child  . Hypertension   . Hyperlipidemia   . Stroke June 2010    left parietal hypertensive  . Hypothyroid     Past Surgical History  Procedure Laterality Date  . Breast surgery  1975    abscess drained       The following portions of the patient's history were reviewed and updated as appropriate: Allergies, current medications, and problem list.    Review of Systems:   12 Pt  review of systems was negative except those addressed in the HPI,     History   Social History  . Marital Status: Single    Spouse Name: N/A    Number of Children: N/A  . Years of Education: N/A   Occupational History  . Not on file.   Social History Main Topics  . Smoking status: Current Every Day Smoker -- 1.00 packs/day    Types: Cigarettes  . Smokeless tobacco: Never Used  . Alcohol Use: 0.6 oz/week    1 Glasses of wine per week  . Drug Use: No  . Sexually Active: No   Other Topics Concern  . Not on file   Social History Narrative  . No narrative on file    Objective:  BP 144/70   Pulse 69  Temp(Src) 98.2 F (36.8 C) (Oral)  Resp 16  Wt 203 lb (92.08 kg)  BMI 33.26 kg/m2  SpO2 98%  General appearance: alert, cooperative and appears stated age Ears: normal TM's and external ear canals both ears Throat: lips, mucosa, and tongue normal; teeth and gums normal Neck: no adenopathy, no carotid bruit, supple, symmetrical, trachea midline and thyroid not enlarged, symmetric, no tenderness/mass/nodules Back: symmetric, no curvature. ROM normal. No CVA tenderness. Lungs: clear to auscultation bilaterally Heart: regular rate and rhythm, S1, S2 normal, no murmur, click, rub or gallop Abdomen: soft, non-tender; bowel sounds normal; no masses,  no organomegaly Pulses: 2+ and symmetric Skin: Skin color, texture, turgor normal. No rashes or lesions Lymph nodes: Cervical, supraclavicular, and axillary nodes normal.  Assessment and Plan:  Tobacco abuse counseling She Quit smoking for 3 months with use of chantix,  But Started smoking again in December . Encouragement provided.   Other bursitis, not elsewhere classified, right hand Secondary to overuse of garden shears .  Diclofenac,  ice and rest   Stroke Nearly ten yeas ago  Risk facfors controlled    Hypertension Well controlled on current regimen. Renal function stable, no changes  today.   Updated Medication List Outpatient Encounter Prescriptions as of 12/28/2012  Medication Sig Dispense Refill  . aspirin 325 MG tablet Take 325 mg by mouth daily.      Marland Kitchen atorvastatin (LIPITOR) 80 MG tablet Take 1 tablet (80 mg total) by mouth daily.  90 tablet  3  . cholecalciferol (VITAMIN D) 1000 UNITS tablet Take 1,000 Units by mouth daily.      Marland Kitchen levothyroxine (SYNTHROID, LEVOTHROID) 50 MCG tablet TAKE ONE TABLET BY MOUTH EVERY DAY  30 tablet  0  . lisinopril (PRINIVIL,ZESTRIL) 40 MG tablet Take 1 tablet (40 mg total) by mouth daily.  90 tablet  3  . Multiple Vitamin (MULTIVITAMIN) tablet Take 1 tablet by mouth daily.      .  diclofenac (VOLTAREN) 75 MG EC tablet Take 1 tablet (75 mg total) by mouth 2 (two) times daily.  60 tablet  1  . pantoprazole (PROTONIX) 40 MG tablet Take 1 tablet (40 mg total) by mouth daily.  30 tablet  2  . varenicline (CHANTIX PAK) 0.5 MG X 11 & 1 MG X 42 tablet Take one 0.5 mg tablet by mouth once daily for 3 days, then increase to one 0.5 mg tablet twice daily for 4 days, then increase to one 1 mg tablet twice daily.  53 tablet  0  . varenicline (CHANTIX STARTING MONTH PAK) 0.5 MG X 11 & 1 MG X 42 tablet Take one 0.5 mg tablet by mouth once daily for 3 days, then increase to one 0.5 mg tablet twice daily for 4 days, then increase to one 1 mg tablet twice daily.  53 tablet  0   No facility-administered encounter medications on file as of 12/28/2012.     No orders of the defined types were placed in this encounter.    No Follow-up on file.

## 2012-12-30 NOTE — Assessment & Plan Note (Addendum)
Secondary to overuse of garden shears .  Diclofenac,  ice and rest

## 2012-12-30 NOTE — Assessment & Plan Note (Signed)
Well controlled on current regimen. Renal function stable, no changes today. 

## 2013-01-01 ENCOUNTER — Encounter: Payer: Self-pay | Admitting: Internal Medicine

## 2013-01-08 ENCOUNTER — Other Ambulatory Visit: Payer: Self-pay | Admitting: Internal Medicine

## 2013-01-10 ENCOUNTER — Other Ambulatory Visit: Payer: Self-pay

## 2013-01-10 ENCOUNTER — Encounter: Payer: Self-pay | Admitting: Internal Medicine

## 2013-01-10 MED ORDER — LEVOTHYROXINE SODIUM 50 MCG PO TABS: ORAL_TABLET | ORAL | Status: DC

## 2013-01-11 ENCOUNTER — Other Ambulatory Visit: Payer: Self-pay | Admitting: *Deleted

## 2013-01-11 MED ORDER — LISINOPRIL 40 MG PO TABS: 40.0000 mg | ORAL_TABLET | Freq: Every day | ORAL | Status: DC

## 2013-03-15 ENCOUNTER — Other Ambulatory Visit: Payer: Self-pay

## 2013-05-24 ENCOUNTER — Ambulatory Visit (INDEPENDENT_AMBULATORY_CARE_PROVIDER_SITE_OTHER): Payer: Medicare Other

## 2013-05-24 DIAGNOSIS — Z23 Encounter for immunization: Secondary | ICD-10-CM | POA: Diagnosis not present

## 2013-06-01 ENCOUNTER — Other Ambulatory Visit: Payer: Self-pay | Admitting: Internal Medicine

## 2013-08-04 ENCOUNTER — Other Ambulatory Visit: Payer: Self-pay | Admitting: Internal Medicine

## 2013-08-04 MED ORDER — LEVOTHYROXINE SODIUM 50 MCG PO TABS: ORAL_TABLET | ORAL | Status: DC

## 2013-08-04 NOTE — Telephone Encounter (Signed)
Rx sent to pharmacy on file per request from pharmacy with notation for patient to call office to schedule an appointment. Was given 60 day supply.

## 2013-08-21 ENCOUNTER — Telehealth: Payer: Self-pay | Admitting: Internal Medicine

## 2013-08-21 DIAGNOSIS — E038 Other specified hypothyroidism: Secondary | ICD-10-CM

## 2013-08-21 DIAGNOSIS — Z79899 Other long term (current) drug therapy: Secondary | ICD-10-CM

## 2013-08-21 DIAGNOSIS — E039 Hypothyroidism, unspecified: Secondary | ICD-10-CM

## 2013-08-21 DIAGNOSIS — E785 Hyperlipidemia, unspecified: Secondary | ICD-10-CM

## 2013-08-21 NOTE — Telephone Encounter (Signed)
She can make appt for fasting labs prior to visit 1./16.  Labs ordered

## 2013-08-21 NOTE — Telephone Encounter (Signed)
Please advise does patient need labs prior to visit? 1/16

## 2013-08-21 NOTE — Telephone Encounter (Signed)
Pt scheduled appt for Friday 1/16 for med refills.  Asking if she can come in sooner for labs to have the results at appt Friday.  No orders in. Pt asking for msg in mychart to let her know, or a phone call.

## 2013-08-22 NOTE — Telephone Encounter (Signed)
Fasting labs scheduled. 08/23/13

## 2013-08-23 ENCOUNTER — Other Ambulatory Visit (INDEPENDENT_AMBULATORY_CARE_PROVIDER_SITE_OTHER): Payer: Medicare Other

## 2013-08-23 DIAGNOSIS — Z79899 Other long term (current) drug therapy: Secondary | ICD-10-CM | POA: Diagnosis not present

## 2013-08-23 DIAGNOSIS — E039 Hypothyroidism, unspecified: Secondary | ICD-10-CM

## 2013-08-23 DIAGNOSIS — E785 Hyperlipidemia, unspecified: Secondary | ICD-10-CM

## 2013-08-23 LAB — COMPREHENSIVE METABOLIC PANEL
ALK PHOS: 86 U/L (ref 39–117)
ALT: 26 U/L (ref 0–35)
AST: 30 U/L (ref 0–37)
Albumin: 4.5 g/dL (ref 3.5–5.2)
BUN: 15 mg/dL (ref 6–23)
CALCIUM: 9.6 mg/dL (ref 8.4–10.5)
CHLORIDE: 104 meq/L (ref 96–112)
CO2: 28 mEq/L (ref 19–32)
CREATININE: 0.9 mg/dL (ref 0.4–1.2)
GFR: 68.17 mL/min (ref >60.00)
Glucose, Bld: 108 mg/dL — ABNORMAL HIGH (ref 70–99)
POTASSIUM: 4.3 meq/L (ref 3.5–5.1)
Sodium: 139 mEq/L (ref 135–145)
Total Bilirubin: 0.7 mg/dL (ref 0.3–1.2)
Total Protein: 6.9 g/dL (ref 6.0–8.3)

## 2013-08-23 LAB — LIPID PANEL
CHOL/HDL RATIO: 4
CHOLESTEROL: 184 mg/dL (ref 0–200)
HDL: 41.9 mg/dL (ref >39.00)
TRIGLYCERIDES: 213 mg/dL — AB (ref 0.0–149.0)
VLDL: 42.6 mg/dL — AB (ref 0.0–40.0)

## 2013-08-23 LAB — TSH: TSH: 4.95 u[IU]/mL (ref 0.35–5.50)

## 2013-08-23 LAB — LDL CHOLESTEROL, DIRECT: Direct LDL: 117.5 mg/dL

## 2013-08-24 ENCOUNTER — Encounter: Payer: Self-pay | Admitting: Internal Medicine

## 2013-08-24 MED ORDER — LEVOTHYROXINE SODIUM 75 MCG PO TABS: 75.0000 ug | ORAL_TABLET | Freq: Every day | ORAL | Status: DC

## 2013-08-24 NOTE — Addendum Note (Signed)
Addended by: [name] on: 08/24/2013 03:38 PM   Modules accepted: Orders

## 2013-08-25 ENCOUNTER — Ambulatory Visit (INDEPENDENT_AMBULATORY_CARE_PROVIDER_SITE_OTHER): Payer: Medicare Other | Admitting: Internal Medicine

## 2013-08-25 ENCOUNTER — Encounter: Payer: Self-pay | Admitting: Internal Medicine

## 2013-08-25 VITALS — BP 156/84 | HR 67 | Temp 98.0°F | Resp 16 | Wt 203.0 lb

## 2013-08-25 DIAGNOSIS — I10 Essential (primary) hypertension: Secondary | ICD-10-CM

## 2013-08-25 DIAGNOSIS — E785 Hyperlipidemia, unspecified: Secondary | ICD-10-CM

## 2013-08-25 DIAGNOSIS — E039 Hypothyroidism, unspecified: Secondary | ICD-10-CM

## 2013-08-25 DIAGNOSIS — F17200 Nicotine dependence, unspecified, uncomplicated: Secondary | ICD-10-CM

## 2013-08-25 DIAGNOSIS — Z7189 Other specified counseling: Secondary | ICD-10-CM

## 2013-08-25 DIAGNOSIS — E669 Obesity, unspecified: Secondary | ICD-10-CM | POA: Insufficient documentation

## 2013-08-25 DIAGNOSIS — Z716 Tobacco abuse counseling: Secondary | ICD-10-CM

## 2013-08-25 DIAGNOSIS — Z1239 Encounter for other screening for malignant neoplasm of breast: Secondary | ICD-10-CM

## 2013-08-25 NOTE — Progress Notes (Signed)
Patient ID: Alexandra Copeland, female   DOB: May 06, 1946, 68 y.o.   MRN: [id number]  Patient Active Problem List   Diagnosis Date Noted  . Obesity, unspecified 08/25/2013  . Other bursitis, not elsewhere classified, right hand 12/30/2012  . Tobacco abuse counseling 05/04/2012  . Pain of right upper arm 05/04/2012  . Hypertension   . Hyperlipidemia   . Chronic arthralgias of knees and hips 10/23/2011  . Stroke 01/08/2009    Subjective:  CC:   Chief Complaint  Patient presents with  . Follow-up    labs and medication refills    HPI:   Alexandra Ucci Hoffmanis a 68 y.o. female who presents for 6 month follow up on hypertension, hyperlipidemia tobacco abuse and obesity   She has been working part time for [employer], doing the education for care givers.   Has a new grandson as of October.  She is still smoking but has reduced her use using  21 mcg Nicotine patch    Obesity .  Her weight actually peaked at  215., and she has lost 13 lbs thus far on low GI diet.  Walking on the treadmill .  And using stationery bike    Past Medical History  Diagnosis Date  . Scarlet fever     as a child  . Hypertension   . Hyperlipidemia   . Stroke June 2010    left parietal hypertensive  . Hypothyroid     Past Surgical History  Procedure Laterality Date  . Breast surgery  1975    abscess drained       The following portions of the patient's history were reviewed and updated as appropriate: Allergies, current medications, and problem list.    Review of Systems:   12 Pt  review of systems was negative except those addressed in the HPI,     History   Social History  . Marital Status: Single    Spouse Name: N/A    Number of Children: N/A  . Years of Education: N/A   Occupational History  . Not on file.   Social History Main Topics  . Smoking status: Current Every Day Smoker -- 1.00 packs/day    Types: Cigarettes  . Smokeless tobacco: Never Used  . Alcohol Use: 0.6  oz/week    1 Glasses of wine per week  . Drug Use: No  . Sexual Activity: No   Other Topics Concern  . Not on file   Social History Narrative  . No narrative on file    Objective:  Filed Vitals:   08/25/13 1036  BP: 156/84  Pulse: 67  Temp: 98 F (36.7 C)  Resp: 16     General appearance: alert, cooperative and appears stated age Ears: normal TM's and external ear canals both ears Throat: lips, mucosa, and tongue normal; teeth and gums normal Neck: no adenopathy, no carotid bruit, supple, symmetrical, trachea midline and thyroid not enlarged, symmetric, no tenderness/mass/nodules Back: symmetric, no curvature. ROM normal. No CVA tenderness. Lungs: clear to auscultation bilaterally Heart: regular rate and rhythm, S1, S2 normal, no murmur, click, rub or gallop Abdomen: soft, non-tender; bowel sounds normal; no masses,  no organomegaly Pulses: 2+ and symmetric Skin: Skin color, texture, turgor normal. No rashes or lesions Lymph nodes: Cervical, supraclavicular, and axillary nodes normal.  Assessment and Plan:  Tobacco abuse counseling Smoking cessation instruction/counseling given:  And congratulated patient on reducing her consumption. Encouraged her to consider resuming Chantix for a minimum of 3 months  to help her quit completely and discussed alternative behaviors such as brewing tea which may help her relax.  Obesity, unspecified I have congratulated her in reduction of   BMI and encouraged  Continued weight loss with goal of 10% of body weight over the next 6 months using a low glycemic index diet and regular exercise a minimum of 5 days per week.    Hypertension Well controlled on current regimen. Renal function stable, no changes today.  Lab Results  Component Value Date   CREATININE 0.9 08/23/2013   Lab Results  Component Value Date   NA 139 08/23/2013   K 4.3 08/23/2013   CL 104 08/23/2013   CO2 28 08/23/2013    Hyperlipidemia She has resumed Lipitor for  management of hyperlipidemia .  Lab Results  Component Value Date   CHOL 184 08/23/2013   HDL 41.90 08/23/2013   LDLCALC 90 12/29/2012   LDLDIRECT 117.5 08/23/2013   TRIG 213.0* 08/23/2013   CHOLHDL 4 08/23/2013   Lab Results  Component Value Date   ALT 26 08/23/2013   AST 30 08/23/2013   ALKPHOS 86 08/23/2013   BILITOT 0.7 08/23/2013    Updated Medication List Outpatient Encounter Prescriptions as of 08/25/2013  Medication Sig  . aspirin 325 MG tablet Take 325 mg by mouth daily.  Marland Kitchen atorvastatin (LIPITOR) 80 MG tablet Take 1 tablet (80 mg total) by mouth daily.  . cholecalciferol (VITAMIN D) 1000 UNITS tablet Take 1,000 Units by mouth daily.  Marland Kitchen levothyroxine (SYNTHROID, LEVOTHROID) 50 MCG tablet Take 50 mcg by mouth daily before breakfast.  . [EXPIRED] levothyroxine (SYNTHROID, LEVOTHROID) 75 MCG tablet Take 50 mcg by mouth daily before breakfast. TAKE ONE TABLET BY MOUTH EVERY DAY  . lisinopril (PRINIVIL,ZESTRIL) 40 MG tablet Take 1 tablet (40 mg total) by mouth daily.  . Multiple Vitamin (MULTIVITAMIN) tablet Take 1 tablet by mouth daily.  . [DISCONTINUED] levothyroxine (SYNTHROID, LEVOTHROID) 75 MCG tablet Take 1 tablet (75 mcg total) by mouth daily before breakfast. TAKE ONE TABLET BY MOUTH EVERY DAY  . pantoprazole (PROTONIX) 40 MG tablet Take 1 tablet (40 mg total) by mouth daily.  . varenicline (CHANTIX PAK) 0.5 MG X 11 & 1 MG X 42 tablet Take one 0.5 mg tablet by mouth once daily for 3 days, then increase to one 0.5 mg tablet twice daily for 4 days, then increase to one 1 mg tablet twice daily.  . varenicline (CHANTIX STARTING MONTH PAK) 0.5 MG X 11 & 1 MG X 42 tablet Take one 0.5 mg tablet by mouth once daily for 3 days, then increase to one 0.5 mg tablet twice daily for 4 days, then increase to one 1 mg tablet twice daily.  . [DISCONTINUED] atorvastatin (LIPITOR) 80 MG tablet TAKE ONE TABLET BY MOUTH ONCE DAILY  . [DISCONTINUED] diclofenac (VOLTAREN) 75 MG EC tablet Take 1 tablet (75  mg total) by mouth 2 (two) times daily.     Orders Placed This Encounter  Procedures  . MM Digital Screening  . TSH    No Follow-up on file.

## 2013-08-25 NOTE — Patient Instructions (Addendum)
I increased your Synthroid to 75 mcg .  This should help with wt loss and triglycerides,  Return after 6 weeks to recheck TSH to make sure we have not overshot our goal of TSh of around 1.0  Anything I can do to help you quit smoking!!   consider chantix since it worked before   Consider the art of tea to help   Return in July for fasting lipids and liver tests

## 2013-08-25 NOTE — Progress Notes (Signed)
Pre-visit discussion using our clinic review tool. No additional management support is needed unless otherwise documented below in the visit note.  

## 2013-08-27 ENCOUNTER — Encounter: Payer: Self-pay | Admitting: Internal Medicine

## 2013-08-27 NOTE — Assessment & Plan Note (Signed)
She has resumed Lipitor for management of hyperlipidemia .  Lab Results  Component Value Date   CHOL 184 08/23/2013   HDL 41.90 08/23/2013   LDLCALC 90 12/29/2012   LDLDIRECT 117.5 08/23/2013   TRIG 213.0* 08/23/2013   CHOLHDL 4 08/23/2013   Lab Results  Component Value Date   ALT 26 08/23/2013   AST 30 08/23/2013   ALKPHOS 86 08/23/2013   BILITOT 0.7 08/23/2013

## 2013-08-27 NOTE — Assessment & Plan Note (Addendum)
Smoking cessation instruction/counseling given:  And congratulated patient on reducing her consumption. Encouraged her to consider resuming Chantix for a minimum of 3 months to help her quit completely and discussed alternative behaviors such as brewing tea which may help her relax.

## 2013-08-27 NOTE — Assessment & Plan Note (Signed)
Well controlled on current regimen. Renal function stable, no changes today.  Lab Results  Component Value Date   CREATININE 0.9 08/23/2013   Lab Results  Component Value Date   NA 139 08/23/2013   K 4.3 08/23/2013   CL 104 08/23/2013   CO2 28 08/23/2013

## 2013-08-27 NOTE — Assessment & Plan Note (Signed)
I have congratulated her in reduction of   BMI and encouraged  Continued weight loss with goal of 10% of body weight over the next 6 months using a low glycemic index diet and regular exercise a minimum of 5 days per week.     

## 2013-08-31 ENCOUNTER — Encounter: Payer: Self-pay | Admitting: Emergency Medicine

## 2013-09-14 ENCOUNTER — Ambulatory Visit: Payer: Self-pay | Admitting: Internal Medicine

## 2013-09-14 DIAGNOSIS — Z1231 Encounter for screening mammogram for malignant neoplasm of breast: Secondary | ICD-10-CM | POA: Diagnosis not present

## 2013-10-09 ENCOUNTER — Encounter: Payer: Self-pay | Admitting: Internal Medicine

## 2013-10-17 DIAGNOSIS — H2510 Age-related nuclear cataract, unspecified eye: Secondary | ICD-10-CM | POA: Diagnosis not present

## 2013-11-02 ENCOUNTER — Other Ambulatory Visit: Payer: Self-pay | Admitting: Internal Medicine

## 2013-12-04 IMAGING — MG MM CAD SCREENING MAMMO
1 series · 5 of 5 positions shown · non-contrast
Comparison: none

REASON FOR EXAM: SCR MAMMO NO ORDER
COMMENTS:

[R CC · right · 5 of 5 slices shown]
[im 1/5]
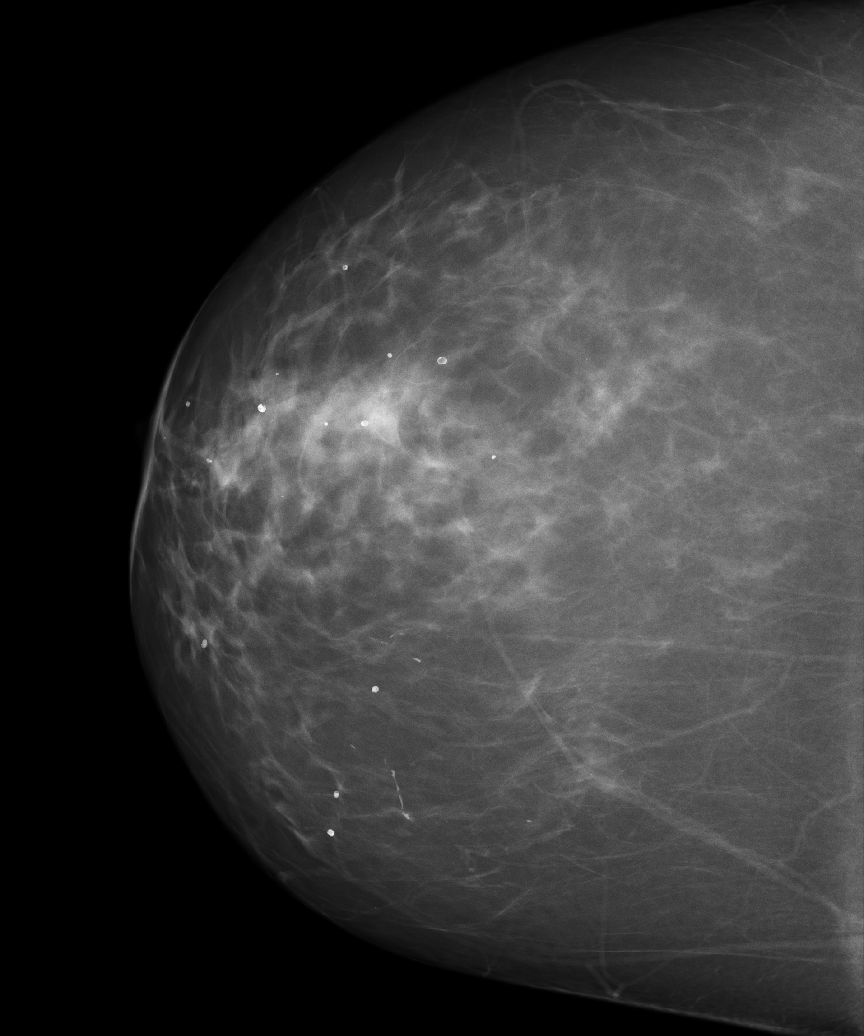
[im 2/5]
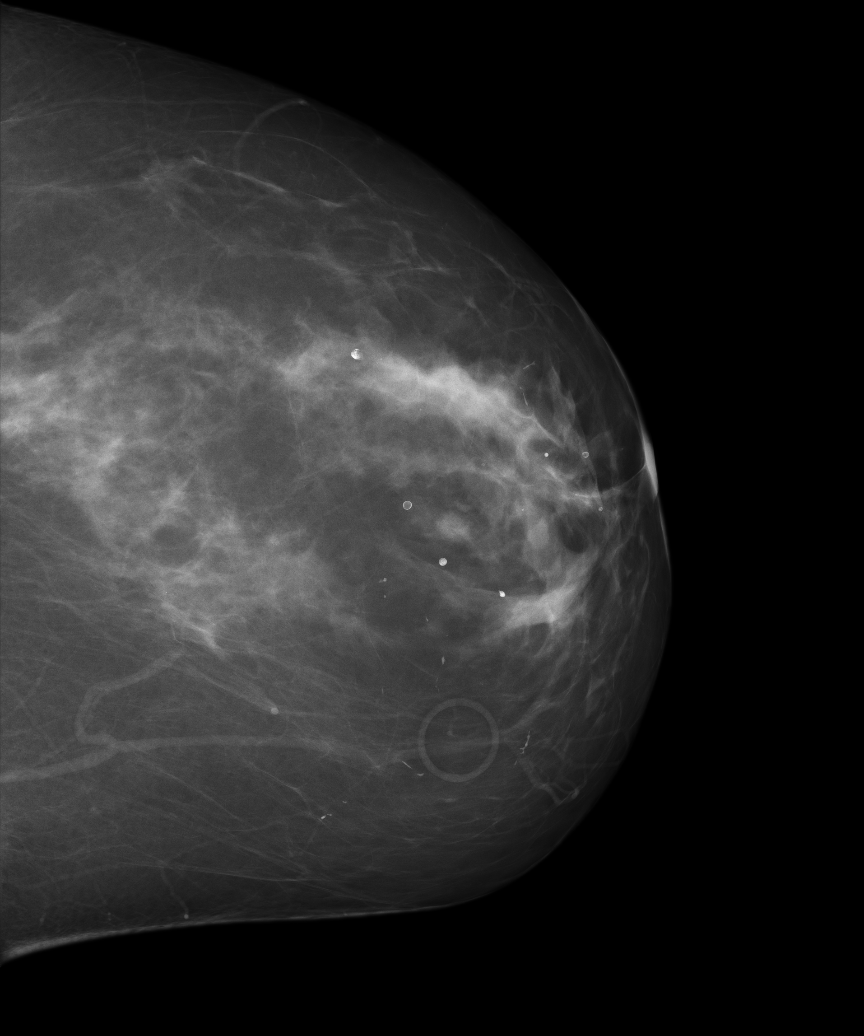
[im 3/5]
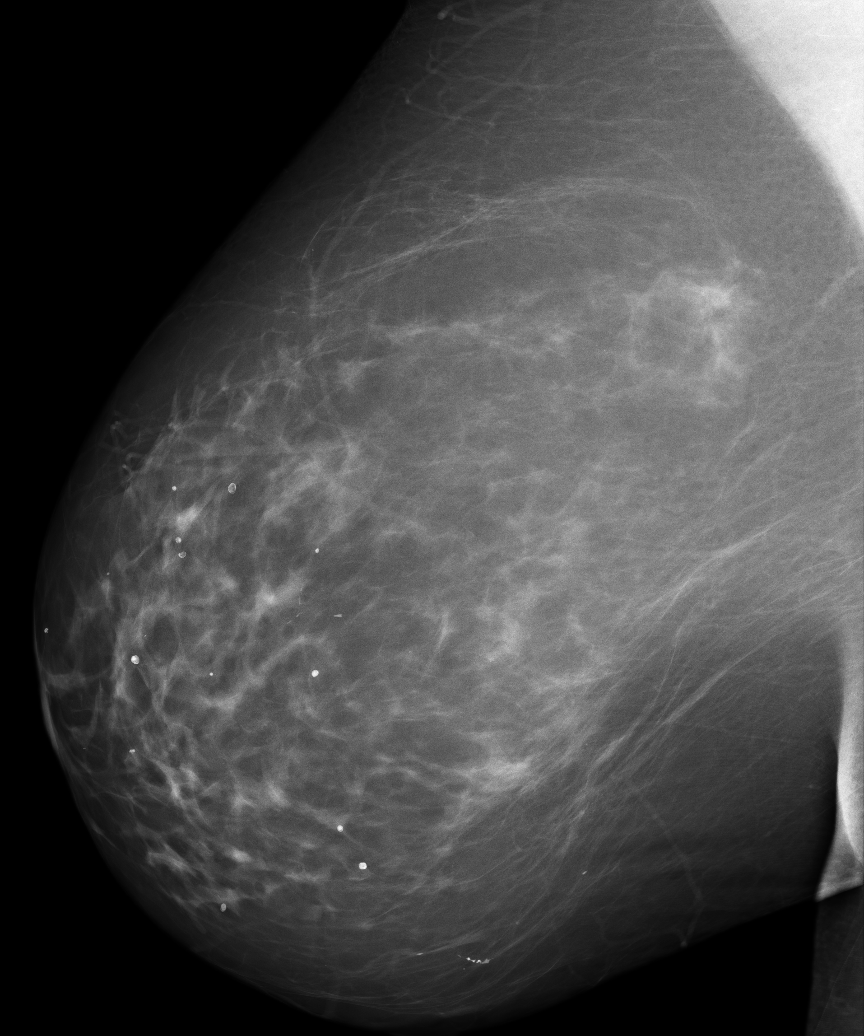
[im 4/5]
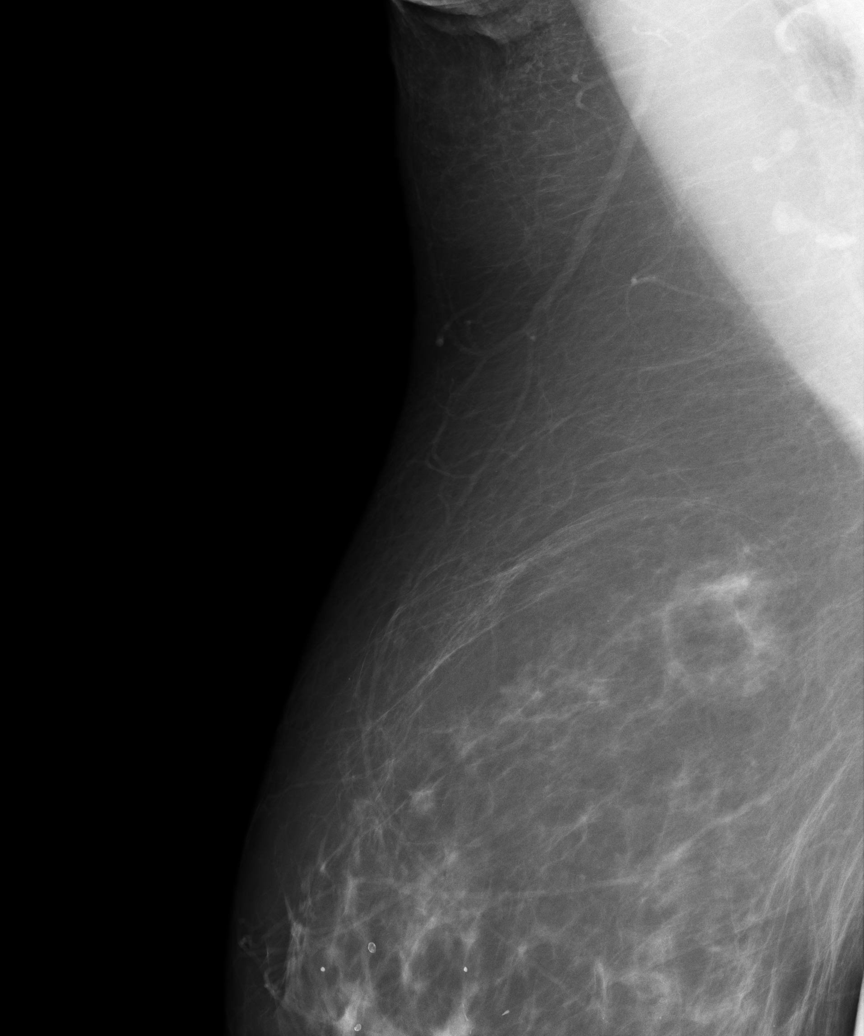
[im 5/5]
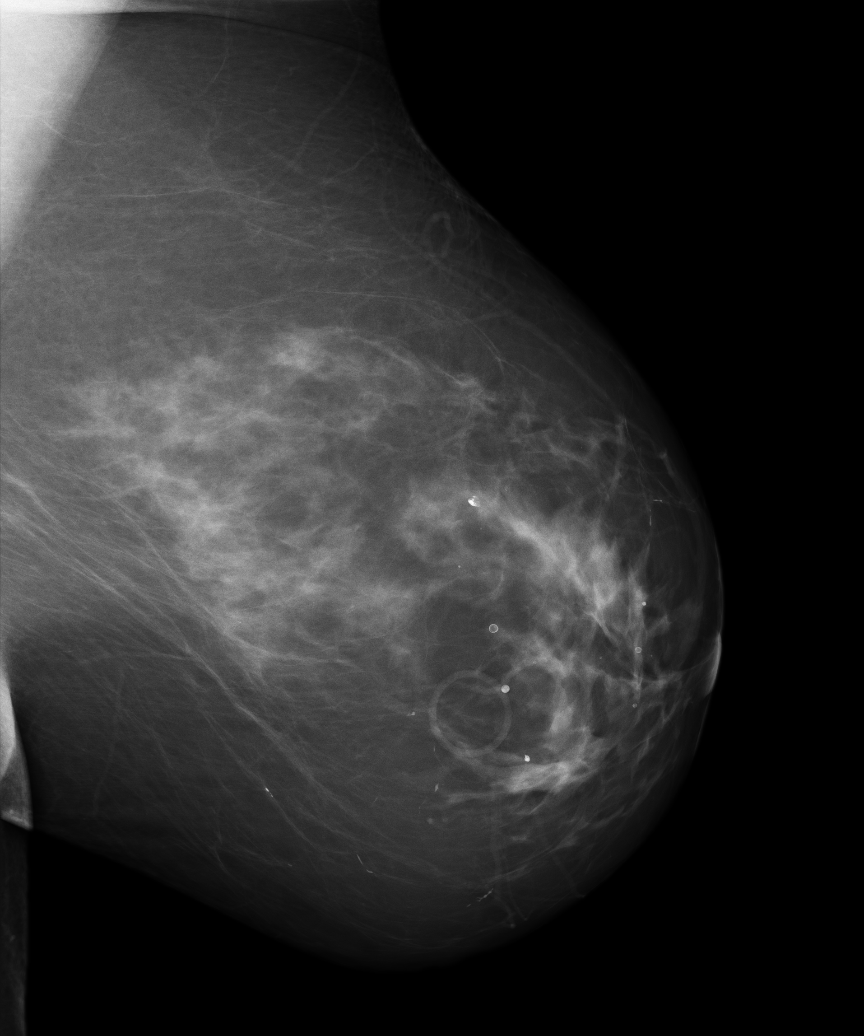

[5 of 5 positions shown; findings below may reference images not displayed]

PROCEDURE:     MAM - MAM DGTL SCRN MAM NO ORDER W/CAD  - April 27, 2012  [DATE]

RESULT:     There is a history of left breast cyst aspiration in 7141 with
benign pathology. There is no other family history of breast cancer.
Comparison is made to previous digital mammographic images 03 March, 2011,
as well as 02 February, 2008.  The breasts exhibit a moderate heterogeneous
parenchymal pattern with scattered, benign appearing calcifications. There
is no developing parenchymal density or dominant mass. There is no malignant
appearing calcification or architectural distortion.
IMPRESSION: 1.Stable, benign appearing bilateral mammogram.

BI-RADS: Category 2 - Benign Findings

Please continue to encourage annual mammographic follow-up.

A NEGATIVE MAMMOGRAM REPORT DOES NOT PRECLUDE BIOPSY OR OTHER EVALUATION OF
A CLINICALLY PALPABLE OR OTHERWISE SUSPICIOUS MASS OR LESION. BREAST CANCER
MAY NOT BE DETECTED BY MAMMOGRAPHY IN UP TO 10% OF CASES.

## 2013-12-06 ENCOUNTER — Other Ambulatory Visit: Payer: Self-pay | Admitting: Internal Medicine

## 2014-02-05 ENCOUNTER — Other Ambulatory Visit: Payer: Self-pay | Admitting: Internal Medicine

## 2014-04-30 ENCOUNTER — Ambulatory Visit (INDEPENDENT_AMBULATORY_CARE_PROVIDER_SITE_OTHER): Payer: Medicare Other | Admitting: Internal Medicine

## 2014-04-30 ENCOUNTER — Encounter: Payer: Self-pay | Admitting: Internal Medicine

## 2014-04-30 VITALS — BP 168/84 | HR 62 | Temp 98.0°F | Resp 16 | Ht 65.5 in | Wt 197.5 lb

## 2014-04-30 DIAGNOSIS — R5383 Other fatigue: Secondary | ICD-10-CM

## 2014-04-30 DIAGNOSIS — E039 Hypothyroidism, unspecified: Secondary | ICD-10-CM | POA: Diagnosis not present

## 2014-04-30 DIAGNOSIS — F17200 Nicotine dependence, unspecified, uncomplicated: Secondary | ICD-10-CM

## 2014-04-30 DIAGNOSIS — F3289 Other specified depressive episodes: Secondary | ICD-10-CM

## 2014-04-30 DIAGNOSIS — R5381 Other malaise: Secondary | ICD-10-CM

## 2014-04-30 DIAGNOSIS — Z23 Encounter for immunization: Secondary | ICD-10-CM | POA: Diagnosis not present

## 2014-04-30 DIAGNOSIS — Z79899 Other long term (current) drug therapy: Secondary | ICD-10-CM

## 2014-04-30 DIAGNOSIS — E669 Obesity, unspecified: Secondary | ICD-10-CM

## 2014-04-30 DIAGNOSIS — F329 Major depressive disorder, single episode, unspecified: Secondary | ICD-10-CM

## 2014-04-30 DIAGNOSIS — Z716 Tobacco abuse counseling: Secondary | ICD-10-CM

## 2014-04-30 DIAGNOSIS — Z8673 Personal history of transient ischemic attack (TIA), and cerebral infarction without residual deficits: Secondary | ICD-10-CM

## 2014-04-30 DIAGNOSIS — Z7189 Other specified counseling: Secondary | ICD-10-CM

## 2014-04-30 DIAGNOSIS — I10 Essential (primary) hypertension: Secondary | ICD-10-CM

## 2014-04-30 DIAGNOSIS — E785 Hyperlipidemia, unspecified: Secondary | ICD-10-CM | POA: Diagnosis not present

## 2014-04-30 LAB — CBC WITH DIFFERENTIAL/PLATELET
BASOS PCT: 0.5 % (ref 0.0–3.0)
Basophils Absolute: 0 10*3/uL (ref 0.0–0.1)
EOS PCT: 1.3 % (ref 0.0–5.0)
Eosinophils Absolute: 0.1 10*3/uL (ref 0.0–0.7)
HEMATOCRIT: 42.6 % (ref 36.0–46.0)
Hemoglobin: 14.5 g/dL (ref 12.0–15.0)
LYMPHS ABS: 1.1 10*3/uL (ref 0.7–4.0)
Lymphocytes Relative: 19.3 % (ref 12.0–46.0)
MCHC: 33.9 g/dL (ref 30.0–36.0)
MCV: 92.6 fl (ref 78.0–100.0)
MONOS PCT: 7.6 % (ref 3.0–12.0)
Monocytes Absolute: 0.4 10*3/uL (ref 0.1–1.0)
NEUTROS ABS: 4.2 10*3/uL (ref 1.4–7.7)
Neutrophils Relative %: 71.3 % (ref 43.0–77.0)
Platelets: 182 10*3/uL (ref 150.0–400.0)
RBC: 4.6 Mil/uL (ref 3.87–5.11)
RDW: 13.3 % (ref 11.5–15.5)
WBC: 5.8 10*3/uL (ref 4.0–10.5)

## 2014-04-30 LAB — LDL CHOLESTEROL, DIRECT: Direct LDL: 128.4 mg/dL

## 2014-04-30 LAB — COMPREHENSIVE METABOLIC PANEL
ALT: 30 U/L (ref 0–35)
AST: 31 U/L (ref 0–37)
Albumin: 4.5 g/dL (ref 3.5–5.2)
Alkaline Phosphatase: 91 U/L (ref 39–117)
BUN: 13 mg/dL (ref 6–23)
CO2: 27 meq/L (ref 19–32)
Calcium: 9.4 mg/dL (ref 8.4–10.5)
Chloride: 105 mEq/L (ref 96–112)
Creatinine, Ser: 0.9 mg/dL (ref 0.4–1.2)
GFR: 64.62 mL/min (ref >60.00)
GLUCOSE: 107 mg/dL — AB (ref 70–99)
Potassium: 4 mEq/L (ref 3.5–5.1)
Sodium: 141 mEq/L (ref 135–145)
Total Bilirubin: 0.5 mg/dL (ref 0.2–1.2)
Total Protein: 7.6 g/dL (ref 6.0–8.3)

## 2014-04-30 LAB — LIPID PANEL
Cholesterol: 198 mg/dL (ref 0–200)
HDL: 40.6 mg/dL (ref >39.00)
NONHDL: 157.4
Total CHOL/HDL Ratio: 5
Triglycerides: 257 mg/dL — ABNORMAL HIGH (ref 0.0–149.0)
VLDL: 51.4 mg/dL — AB (ref 0.0–40.0)

## 2014-04-30 LAB — TSH: TSH: 1.72 u[IU]/mL (ref 0.35–4.50)

## 2014-04-30 MED ORDER — LOSARTAN POTASSIUM 100 MG PO TABS: 100.0000 mg | ORAL_TABLET | Freq: Every day | ORAL | Status: DC

## 2014-04-30 MED ORDER — BUPROPION HCL ER (XL) 150 MG PO TB24: 150.0000 mg | ORAL_TABLET | Freq: Every day | ORAL | Status: DC

## 2014-04-30 NOTE — Assessment & Plan Note (Addendum)
counselling given. Smoking cessation instruction/counseling given:  counseled patient on the dangers of tobacco use, advised patient to stop smoking, and reviewed strategies to maximize success.  Patient given rx for wellbutrin for concurrent mild depression

## 2014-04-30 NOTE — Patient Instructions (Addendum)
check BP in one week after you change to losartan 10 mg daily.  We are trying wellbutrin for your tobacco cravings   Set a quit date and tell your family !!!  Smoking Cessation Quitting smoking is important to your health and has many advantages. However, it is not always easy to quit since nicotine is a very addictive drug. Oftentimes, people try 3 times or more before being able to quit. This document explains the best ways for you to prepare to quit smoking. Quitting takes hard work and a lot of effort, but you can do it. ADVANTAGES OF QUITTING SMOKING  You will live longer, feel better, and live better.  Your body will feel the impact of quitting smoking almost immediately.  Within 20 minutes, blood pressure decreases. Your pulse returns to its normal level.  After 8 hours, carbon monoxide levels in the blood return to normal. Your oxygen level increases.  After 24 hours, the chance of having a heart attack starts to decrease. Your breath, hair, and body stop smelling like smoke.  After 48 hours, damaged nerve endings begin to recover. Your sense of taste and smell improve.  After 72 hours, the body is virtually free of nicotine. Your bronchial tubes relax and breathing becomes easier.  After 2 to 12 weeks, lungs can hold more air. Exercise becomes easier and circulation improves.  The risk of having a heart attack, stroke, cancer, or lung disease is greatly reduced.  After 1 year, the risk of coronary heart disease is cut in half.  After 5 years, the risk of stroke falls to the same as a nonsmoker.  After 10 years, the risk of lung cancer is cut in half and the risk of other cancers decreases significantly.  After 15 years, the risk of coronary heart disease drops, usually to the level of a nonsmoker.  If you are pregnant, quitting smoking will improve your chances of having a healthy baby.  The people you live with, especially any children, will be healthier.  You will  have extra money to spend on things other than cigarettes. QUESTIONS TO THINK ABOUT BEFORE ATTEMPTING TO QUIT You may want to talk about your answers with your health care provider.  Why do you want to quit?  If you tried to quit in the past, what helped and what did not?  What will be the most difficult situations for you after you quit? How will you plan to handle them?  Who can help you through the tough times? Your family? Friends? A health care provider?  What pleasures do you get from smoking? What ways can you still get pleasure if you quit? Here are some questions to ask your health care provider:  How can you help me to be successful at quitting?  What medicine do you think would be best for me and how should I take it?  What should I do if I need more help?  What is smoking withdrawal like? How can I get information on withdrawal? GET READY  Set a quit date.  Change your environment by getting rid of all cigarettes, ashtrays, matches, and lighters in your home, car, or work. Do not let people smoke in your home.  Review your past attempts to quit. Think about what worked and what did not. GET SUPPORT AND ENCOURAGEMENT You have a better chance of being successful if you have help. You can get support in many ways.  Tell your family, friends, and coworkers that you  are going to quit and need their support. Ask them not to smoke around you.  Get individual, group, or telephone counseling and support. Programs are available at [employer] and health centers. Call your local health department for information about programs in your area.  Spiritual beliefs and practices may help some smokers quit.  Download a "quit meter" on your computer to keep track of quit statistics, such as how long you have gone without smoking, cigarettes not smoked, and money saved.  Get a self-help book about quitting smoking and staying off tobacco. Liberty yourself from urges to smoke. Talk to someone, go for a walk, or occupy your time with a task.  Change your normal routine. Take a different route to work. Drink tea instead of coffee. Eat breakfast in a different place.  Reduce your stress. Take a hot bath, exercise, or read a book.  Plan something enjoyable to do every day. Reward yourself for not smoking.  Explore interactive web-based programs that specialize in helping you quit. GET MEDICINE AND USE IT CORRECTLY Medicines can help you stop smoking and decrease the urge to smoke. Combining medicine with the above behavioral methods and support can greatly increase your chances of successfully quitting smoking.  Nicotine replacement therapy helps deliver nicotine to your body without the negative effects and risks of smoking. Nicotine replacement therapy includes nicotine gum, lozenges, inhalers, nasal sprays, and skin patches. Some may be available over-the-counter and others require a prescription.  Antidepressant medicine helps people abstain from smoking, but how this works is unknown. This medicine is available by prescription.  Nicotinic receptor partial agonist medicine simulates the effect of nicotine in your brain. This medicine is available by prescription. Ask your health care provider for advice about which medicines to use and how to use them based on your health history. Your health care provider will tell you what side effects to look out for if you choose to be on a medicine or therapy. Carefully read the information on the package. Do not use any other product containing nicotine while using a nicotine replacement product.  RELAPSE OR DIFFICULT SITUATIONS Most relapses occur within the first 3 months after quitting. Do not be discouraged if you start smoking again. Remember, most people try several times before finally quitting. You may have symptoms of withdrawal because your body is used to nicotine. You  may crave cigarettes, be irritable, feel very hungry, cough often, get headaches, or have difficulty concentrating. The withdrawal symptoms are only temporary. They are strongest when you first quit, but they will go away within 10-14 days. To reduce the chances of relapse, try to:  Avoid drinking alcohol. Drinking lowers your chances of successfully quitting.  Reduce the amount of caffeine you consume. Once you quit smoking, the amount of caffeine in your body increases and can give you symptoms, such as a rapid heartbeat, sweating, and anxiety.  Avoid smokers because they can make you want to smoke.  Do not let weight gain distract you. Many smokers will gain weight when they quit, usually less than 10 pounds. Eat a healthy diet and stay active. You can always lose the weight gained after you quit.  Find ways to improve your mood other than smoking. FOR MORE INFORMATION  www.smokefree.gov  Document Released: 07/21/2001 Document Revised: 12/11/2013 Document Reviewed: 11/05/2011 [facility] Patient Information 2015 [place], [place]. This information is not intended to replace advice given to you by your health care provider. Make  sure you discuss any questions you have with your health care provider.  

## 2014-04-30 NOTE — Progress Notes (Signed)
Patient ID: Alexandra Copeland, female   DOB: Dec 24, 1945, 68 y.o.   MRN: [id number]  Patient Active Problem List   Diagnosis Date Noted  . Depressive disorder, not elsewhere classified 05/02/2014  . Obesity, unspecified 08/25/2013  . Other bursitis, not elsewhere classified, right hand 12/30/2012  . Tobacco abuse counseling 05/04/2012  . Pain of right upper arm 05/04/2012  . Hypertension   . Hyperlipidemia   . Chronic arthralgias of knees and hips 10/23/2011  . History of CVA (cerebrovascular accident) without residual deficits 01/08/2009    Subjective:  CC:   Chief Complaint  Patient presents with  . Follow-up    For medication refill, Patient is fasting for any labs.  . Hypertension    HPI:   NEA GITTENS is a 68 y.o. female who presents for  Follow up on hypertension and other chronic issues.    She has been feeling more fatigued.  Not sleeping well.  Her dog has developed an ulcer on his eye and she has been taking care of him and worrying about his prognosis.  She is not sleeping well due to his care regimen.  Has also started smoking again due to loneliness and boredom.  Not exercising.  Not following a diet,  Gaining weight.Wondering ain an antidepressant would help.     Taking meds as directed.  Retireed so doesn't check BP anymore. .     Past Medical History  Diagnosis Date  . Scarlet fever     as a child  . Hypertension   . Hyperlipidemia   . Stroke June 2010    left parietal hypertensive  . Hypothyroid     Past Surgical History  Procedure Laterality Date  . Breast surgery  1975    abscess drained       The following portions of the patient's history were reviewed and updated as appropriate: Allergies, current medications, and problem list.    Review of Systems:   Patient denies headache, fevers, malaise, unintentional weight loss, skin rash, eye pain, sinus congestion and sinus pain, sore throat, dysphagia,  hemoptysis , cough, dyspnea,  wheezing, chest pain, palpitations, orthopnea, edema, abdominal pain, nausea, melena, diarrhea, constipation, flank pain, dysuria, hematuria, urinary  Frequency, nocturia, numbness, tingling, seizures,  Focal weakness, Loss of consciousness,  Tremor, insomnia, depression, anxiety, and suicidal ideation.     History   Social History  . Marital Status: Single    Spouse Name: N/A    Number of Children: N/A  . Years of Education: N/A   Occupational History  . Not on file.   Social History Main Topics  . Smoking status: Current Every Day Smoker -- 1.00 packs/day    Types: Cigarettes  . Smokeless tobacco: Never Used  . Alcohol Use: 0.6 oz/week    1 Glasses of wine per week  . Drug Use: No  . Sexual Activity: No   Other Topics Concern  . Not on file   Social History Narrative  . No narrative on file    Objective:  Filed Vitals:   04/30/14 0851  BP: 168/84  Pulse: 62  Temp: 98 F (36.7 C)  Resp: 16     General appearance: alert, cooperative and appears stated age Ears: normal TM's and external ear canals both ears Throat: lips, mucosa, and tongue normal; teeth and gums normal Neck: no adenopathy, no carotid bruit, supple, symmetrical, trachea midline and thyroid not enlarged, symmetric, no tenderness/mass/nodules Back: symmetric, no curvature. ROM normal. No CVA tenderness. Lungs:  clear to auscultation bilaterally Heart: regular rate and rhythm, S1, S2 normal, no murmur, click, rub or gallop Abdomen: soft, non-tender; bowel sounds normal; no masses,  no organomegaly Pulses: 2+ and symmetric Skin: Skin color, texture, turgor normal. No rashes or lesions Lymph nodes: Cervical, supraclavicular, and axillary nodes normal.  Assessment and Plan:  Tobacco abuse counseling counselling given. Smoking cessation instruction/counseling given:  counseled patient on the dangers of tobacco use, advised patient to stop smoking, and reviewed strategies to maximize success.  Patient  given rx for wellbutrin for concurrent mild depression  Hyperlipidemia Tolerating potent dose statin therapy.   Liver enzymes are normal , no changes today. Elevated trigs addressed with diet and exercise.   Lab Results  Component Value Date   ALT 30 04/30/2014   AST 31 04/30/2014   ALKPHOS 91 04/30/2014   BILITOT 0.5 04/30/2014   Lab Results  Component Value Date   CHOL 198 04/30/2014   HDL 40.60 04/30/2014   LDLCALC 90 12/29/2012   LDLDIRECT 128.4 04/30/2014   TRIG 257.0* 04/30/2014   CHOLHDL 5 04/30/2014     Hypertension Not Well controlled on current regimen. Renal function stable, increasing losartan dose to 100 mg   Lab Results  Component Value Date   CREATININE 0.9 04/30/2014   Lab Results  Component Value Date   NA 141 04/30/2014   K 4.0 04/30/2014   CL 105 04/30/2014   CO2 27 04/30/2014     Obesity, unspecified I have addressed  BMI and recommended a low glycemic index diet utilizing smaller more frequent meals to increase metabolism.  I have also recommended that patient start exercising with a goal of 30 minutes of aerobic exercise a minimum of 5 days per week.    History of CVA (cerebrovascular accident) without residual deficits Addressed risk factors fo recurrent cva including htn and tobacco abuse.   Depressive disorder, not elsewhere classified Discussed mood, other symptoms,  Lack of motivation,  Weight gain and tobacco abuse.  Agreed to trial of wellbutrin     Updated Medication List Outpatient Encounter Prescriptions as of 04/30/2014  Medication Sig  . aspirin 325 MG tablet Take 325 mg by mouth daily.  Marland Kitchen atorvastatin (LIPITOR) 80 MG tablet Take 1 tablet (80 mg total) by mouth daily.  . cholecalciferol (VITAMIN D) 1000 UNITS tablet Take 1,000 Units by mouth daily.  Marland Kitchen levothyroxine (SYNTHROID, LEVOTHROID) 75 MCG tablet TAKE ONE TABLET BY MOUTH ONCE DAILY BEFORE BREAKFAST  . Multiple Vitamin (MULTIVITAMIN) tablet Take 1 tablet by mouth daily.  . vitamin E  400 UNIT capsule Take 400 Units by mouth daily.  . [DISCONTINUED] lisinopril (PRINIVIL,ZESTRIL) 40 MG tablet TAKE ONE TABLET BY MOUTH ONCE DAILY  . buPROPion (WELLBUTRIN XL) 150 MG 24 hr tablet Take 1 tablet (150 mg total) by mouth daily.  Marland Kitchen losartan (COZAAR) 100 MG tablet Take 1 tablet (100 mg total) by mouth daily.  . [DISCONTINUED] atorvastatin (LIPITOR) 80 MG tablet TAKE ONE TABLET BY MOUTH ONCE DAILY  . [DISCONTINUED] levothyroxine (SYNTHROID, LEVOTHROID) 50 MCG tablet Take 50 mcg by mouth daily before breakfast.  . [DISCONTINUED] lisinopril (PRINIVIL,ZESTRIL) 40 MG tablet Take 1 tablet (40 mg total) by mouth daily.  . [DISCONTINUED] pantoprazole (PROTONIX) 40 MG tablet Take 1 tablet (40 mg total) by mouth daily.  . [DISCONTINUED] varenicline (CHANTIX PAK) 0.5 MG X 11 & 1 MG X 42 tablet Take one 0.5 mg tablet by mouth once daily for 3 days, then increase to one 0.5 mg tablet twice  daily for 4 days, then increase to one 1 mg tablet twice daily.  . [DISCONTINUED] varenicline (CHANTIX STARTING MONTH PAK) 0.5 MG X 11 & 1 MG X 42 tablet Take one 0.5 mg tablet by mouth once daily for 3 days, then increase to one 0.5 mg tablet twice daily for 4 days, then increase to one 1 mg tablet twice daily.     Orders Placed This Encounter  Procedures  . CBC with Differential  . Comprehensive metabolic panel  . TSH  . Lipid panel  . LDL cholesterol, direct    Return in about 4 weeks (around 05/28/2014).

## 2014-04-30 NOTE — Progress Notes (Signed)
Pre-visit discussion using our clinic review tool. No additional management support is needed unless otherwise documented below in the visit note.  

## 2014-05-01 ENCOUNTER — Encounter: Payer: Self-pay | Admitting: Internal Medicine

## 2014-05-02 ENCOUNTER — Encounter: Payer: Self-pay | Admitting: Internal Medicine

## 2014-05-02 DIAGNOSIS — F325 Major depressive disorder, single episode, in full remission: Secondary | ICD-10-CM | POA: Insufficient documentation

## 2014-05-02 DIAGNOSIS — F329 Major depressive disorder, single episode, unspecified: Secondary | ICD-10-CM | POA: Insufficient documentation

## 2014-05-02 NOTE — Assessment & Plan Note (Signed)
Not Well controlled on current regimen. Renal function stable, increasing losartan dose to 100 mg   Lab Results  Component Value Date   CREATININE 0.9 04/30/2014   Lab Results  Component Value Date   NA 141 04/30/2014   K 4.0 04/30/2014   CL 105 04/30/2014   CO2 27 04/30/2014

## 2014-05-02 NOTE — Assessment & Plan Note (Addendum)
Discussed mood, other symptoms,  Lack of motivation,  Weight gain and tobacco abuse.  Agreed to trial of wellbutrin

## 2014-05-02 NOTE — Assessment & Plan Note (Signed)
Addressed risk factors fo recurrent cva including htn and tobacco abuse.

## 2014-05-02 NOTE — Assessment & Plan Note (Signed)
I have addressed  BMI and recommended a low glycemic index diet utilizing smaller more frequent meals to increase metabolism.  I have also recommended that patient start exercising with a goal of 30 minutes of aerobic exercise a minimum of 5 days per week.  

## 2014-05-02 NOTE — Assessment & Plan Note (Signed)
Tolerating potent dose statin therapy.   Liver enzymes are normal , no changes today. Elevated trigs addressed with diet and exercise.   Lab Results  Component Value Date   ALT 30 04/30/2014   AST 31 04/30/2014   ALKPHOS 91 04/30/2014   BILITOT 0.5 04/30/2014   Lab Results  Component Value Date   CHOL 198 04/30/2014   HDL 40.60 04/30/2014   LDLCALC 90 12/29/2012   LDLDIRECT 128.4 04/30/2014   TRIG 257.0* 04/30/2014   CHOLHDL 5 04/30/2014

## 2014-05-09 ENCOUNTER — Other Ambulatory Visit: Payer: Self-pay | Admitting: Internal Medicine

## 2014-05-28 ENCOUNTER — Ambulatory Visit (INDEPENDENT_AMBULATORY_CARE_PROVIDER_SITE_OTHER): Payer: Medicare Other | Admitting: Internal Medicine

## 2014-05-28 ENCOUNTER — Encounter: Payer: Self-pay | Admitting: Internal Medicine

## 2014-05-28 VITALS — BP 150/72 | HR 63 | Temp 98.1°F | Resp 16 | Ht 65.5 in | Wt 202.5 lb

## 2014-05-28 DIAGNOSIS — E785 Hyperlipidemia, unspecified: Secondary | ICD-10-CM

## 2014-05-28 DIAGNOSIS — F321 Major depressive disorder, single episode, moderate: Secondary | ICD-10-CM | POA: Diagnosis not present

## 2014-05-28 DIAGNOSIS — I10 Essential (primary) hypertension: Secondary | ICD-10-CM

## 2014-05-28 DIAGNOSIS — Z23 Encounter for immunization: Secondary | ICD-10-CM

## 2014-05-28 DIAGNOSIS — Z79899 Other long term (current) drug therapy: Secondary | ICD-10-CM | POA: Diagnosis not present

## 2014-05-28 DIAGNOSIS — E669 Obesity, unspecified: Secondary | ICD-10-CM

## 2014-05-28 DIAGNOSIS — Z716 Tobacco abuse counseling: Secondary | ICD-10-CM

## 2014-05-28 MED ORDER — HYDROCHLOROTHIAZIDE 25 MG PO TABS: 25.0000 mg | ORAL_TABLET | Freq: Every day | ORAL | Status: DC

## 2014-05-28 MED ORDER — LOSARTAN POTASSIUM-HCTZ 100-25 MG PO TABS: 1.0000 | ORAL_TABLET | Freq: Every day | ORAL | Status: DC

## 2014-05-28 NOTE — Patient Instructions (Signed)
I am adding hctz 25 mg to the losartan  Goal bp is 120/70 ideally  We will combine them into one pill when you run out of losartan  (rx provided)  If still above goal after 2 weeks,  We'll add amlodipine   Return for nonfasting  BMEt in 2 weeks ( sodium )

## 2014-05-28 NOTE — Progress Notes (Signed)
Pre-visit discussion using our clinic review tool. No additional management support is needed unless otherwise documented below in the visit note.  

## 2014-05-29 ENCOUNTER — Encounter: Payer: Self-pay | Admitting: Internal Medicine

## 2014-05-29 ENCOUNTER — Telehealth: Payer: Self-pay | Admitting: Internal Medicine

## 2014-05-29 NOTE — Progress Notes (Signed)
Patient ID: Alexandra Copeland, female   DOB: 24-Feb-1946, 68 y.o.   MRN: [id number]  Patient Active Problem List   Diagnosis Date Noted  . Major depressive disorder, single episode 05/02/2014  . Obesity 08/25/2013  . Tobacco abuse counseling 05/04/2012  . Hypertension   . Hyperlipidemia   . Chronic arthralgias of knees and hips 10/23/2011  . History of CVA (cerebrovascular accident) without residual deficits 01/08/2009    Subjective:  CC:   Chief Complaint  Patient presents with  . Follow-up  . Hypertension    Follow up BP medication change.    HPI:   Alexandra Copeland is a 68 y.o. female who presents for  Follow up on hypertension, depression  and tobacco abuse  Home bps remain elevated after changing dose of losartan to 100 mg daily.  No side effects  Still smoking, but has reduced daily use,  Wants to quit for her grandchildren and her dogs .  Chief challenges are when drivining and talking on the phone.   Feeling less fatigued  Tolerating wellbutrin,   At last visit 3 weeks ago:.    She has been feeling more fatigued.  Not sleeping well.  Her dog has developed an ulcer on his eye and she has been taking Copeland of him and worrying about his prognosis.  She is not sleeping well due to his Copeland regimen.  Has also started smoking again due to loneliness and boredom.  Not exercising.  Not following a diet,  Gaining weight.  Past Medical History  Diagnosis Date  . Scarlet fever     as a child  . Hypertension   . Hyperlipidemia   . Stroke June 2010    left parietal hypertensive  . Hypothyroid     Past Surgical History  Procedure Laterality Date  . Breast surgery  1975    abscess drained       The following portions of the patient's history were reviewed and updated as appropriate: Allergies, current medications, and problem list.    Review of Systems:   Patient denies headache, fevers, malaise, unintentional weight loss, skin rash, eye pain, sinus congestion and  sinus pain, sore throat, dysphagia,  hemoptysis , cough, dyspnea, wheezing, chest pain, palpitations, orthopnea, edema, abdominal pain, nausea, melena, diarrhea, constipation, flank pain, dysuria, hematuria, urinary  Frequency, nocturia, numbness, tingling, seizures,  Focal weakness, Loss of consciousness,  Tremor, insomnia, depression, anxiety, and suicidal ideation.     History   Social History  . Marital Status: Single    Spouse Name: N/A    Number of Children: N/A  . Years of Education: N/A   Occupational History  . Not on file.   Social History Main Topics  . Smoking status: Current Every Day Smoker -- 1.00 packs/day    Types: Cigarettes  . Smokeless tobacco: Never Used  . Alcohol Use: 0.6 oz/week    1 Glasses of wine per week  . Drug Use: No  . Sexual Activity: No   Other Topics Concern  . Not on file   Social History Narrative  . No narrative on file    Objective:  Filed Vitals:   05/28/14 0805  BP: 150/72  Pulse: 63  Temp: 98.1 F (36.7 C)  Resp: 16     General appearance: alert, cooperative and appears stated age Ears: normal TM's and external ear canals both ears Throat: lips, mucosa, and tongue normal; teeth and gums normal Neck: no adenopathy, no carotid bruit, supple, symmetrical, trachea  midline and thyroid not enlarged, symmetric, no tenderness/mass/nodules Back: symmetric, no curvature. ROM normal. No CVA tenderness. Lungs: clear to auscultation bilaterally Heart: regular rate and rhythm, S1, S2 normal, no murmur, click, rub or gallop Abdomen: soft, non-tender; bowel sounds normal; no masses,  no organomegaly Pulses: 2+ and symmetric Skin: Skin color, texture, turgor normal. No rashes or lesions Lymph nodes: Cervical, supraclavicular, and axillary nodes normal.  Assessment and Plan:  Major depressive disorder, single episode Tolerating wellbutrin with no side effects.  Tobacco cravings are improved but not gone.  counselling given. No changes  to dose today   Hypertension Improved but not at goal.  Adding hctz 25 mg   Return for bmet two weeks  Will add amlodipine if bp not <130/80 in 2 weeks   Obesity She has gained 5 lbs since last visit and triglycerides were elevated on fasting lipids. .  I have addressed  BMI and recommended wt loss of 10% of body weigh over the next 6 months using a low glycemic index diet and regular exercise a minimum of 5 days per week.    Tobacco abuse counseling The patient was counseled on the dangers of tobacco use, and was advised to quit.  Reviewed strategies to maximize success, including removing cigarettes and smoking materials from environment.    Updated Medication List Outpatient Encounter Prescriptions as of 05/28/2014  Medication Sig  . aspirin 325 MG tablet Take 325 mg by mouth daily.  Marland Kitchen atorvastatin (LIPITOR) 80 MG tablet Take 1 tablet (80 mg total) by mouth daily.  Marland Kitchen buPROPion (WELLBUTRIN XL) 150 MG 24 hr tablet Take 1 tablet (150 mg total) by mouth daily.  . cholecalciferol (VITAMIN D) 1000 UNITS tablet Take 1,000 Units by mouth daily.  Marland Kitchen levothyroxine (SYNTHROID, LEVOTHROID) 75 MCG tablet TAKE ONE TABLET BY MOUTH ONCE DAILY BEFORE BREAKFAST  . losartan (COZAAR) 100 MG tablet Take 1 tablet (100 mg total) by mouth daily.  . Multiple Vitamin (MULTIVITAMIN) tablet Take 1 tablet by mouth daily.  . vitamin E 400 UNIT capsule Take 400 Units by mouth daily.  . hydrochlorothiazide (HYDRODIURIL) 25 MG tablet Take 1 tablet (25 mg total) by mouth daily.  Marland Kitchen losartan-hydrochlorothiazide (HYZAAR) 100-25 MG per tablet Take 1 tablet by mouth daily.     Orders Placed This Encounter  Procedures  . Pneumococcal conjugate vaccine 13-valent  . Basic metabolic panel    No Follow-up on file.

## 2014-05-29 NOTE — Assessment & Plan Note (Signed)
The patient was counseled on the dangers of tobacco use, and was advised to quit.  Reviewed strategies to maximize success, including removing cigarettes and smoking materials from environment. 

## 2014-05-29 NOTE — Assessment & Plan Note (Addendum)
She has gained 5 lbs since last visit and triglycerides were elevated on fasting lipids. .  I have addressed  BMI and recommended wt loss of 10% of body weigh over the next 6 months using a low glycemic index diet and regular exercise a minimum of 5 days per week.

## 2014-05-29 NOTE — Telephone Encounter (Signed)
emmi emailed °

## 2014-05-29 NOTE — Assessment & Plan Note (Signed)
Tolerating wellbutrin with no side effects.  Tobacco cravings are improved but not gone.  counselling given. No changes to dose today

## 2014-05-29 NOTE — Assessment & Plan Note (Signed)
Improved but not at goal.  Adding hctz 25 mg   Return for bmet two weeks  Will add amlodipine if bp not <130/80 in 2 weeks

## 2014-06-11 ENCOUNTER — Other Ambulatory Visit (INDEPENDENT_AMBULATORY_CARE_PROVIDER_SITE_OTHER): Payer: Medicare Other

## 2014-06-11 ENCOUNTER — Other Ambulatory Visit: Payer: Self-pay | Admitting: Internal Medicine

## 2014-06-11 DIAGNOSIS — E039 Hypothyroidism, unspecified: Secondary | ICD-10-CM | POA: Diagnosis not present

## 2014-06-11 DIAGNOSIS — Z79899 Other long term (current) drug therapy: Secondary | ICD-10-CM | POA: Diagnosis not present

## 2014-06-11 LAB — BASIC METABOLIC PANEL
BUN: 15 mg/dL (ref 6–23)
CO2: 24 mEq/L (ref 19–32)
Calcium: 9.2 mg/dL (ref 8.4–10.5)
Chloride: 102 mEq/L (ref 96–112)
Creatinine, Ser: 0.9 mg/dL (ref 0.4–1.2)
GFR: 64.6 mL/min (ref >60.00)
GLUCOSE: 143 mg/dL — AB (ref 70–99)
POTASSIUM: 3.7 meq/L (ref 3.5–5.1)
SODIUM: 137 meq/L (ref 135–145)

## 2014-06-11 LAB — TSH: TSH: 0.57 u[IU]/mL (ref 0.35–4.50)

## 2014-06-13 ENCOUNTER — Encounter: Payer: Self-pay | Admitting: Internal Medicine

## 2014-08-12 ENCOUNTER — Encounter: Payer: Self-pay | Admitting: Internal Medicine

## 2014-08-13 MED ORDER — LOSARTAN POTASSIUM-HCTZ 100-25 MG PO TABS: 1.0000 | ORAL_TABLET | Freq: Every day | ORAL | Status: DC

## 2014-09-18 ENCOUNTER — Ambulatory Visit (INDEPENDENT_AMBULATORY_CARE_PROVIDER_SITE_OTHER): Payer: Medicare Other | Admitting: *Deleted

## 2014-09-18 DIAGNOSIS — Z111 Encounter for screening for respiratory tuberculosis: Secondary | ICD-10-CM

## 2014-09-21 LAB — TB SKIN TEST
Induration: 0 mm
TB Skin Test: NEGATIVE

## 2014-12-05 ENCOUNTER — Other Ambulatory Visit: Payer: Self-pay | Admitting: Internal Medicine

## 2015-01-18 ENCOUNTER — Other Ambulatory Visit: Payer: Self-pay | Admitting: Internal Medicine

## 2015-01-18 NOTE — Telephone Encounter (Signed)
Sent mychart on need for appt 

## 2015-01-21 NOTE — Telephone Encounter (Signed)
Mailed copy of labs to patient

## 2015-01-28 ENCOUNTER — Telehealth: Payer: Self-pay | Admitting: *Deleted

## 2015-01-28 DIAGNOSIS — F320 Major depressive disorder, single episode, mild: Secondary | ICD-10-CM

## 2015-01-28 DIAGNOSIS — E785 Hyperlipidemia, unspecified: Secondary | ICD-10-CM

## 2015-01-28 DIAGNOSIS — E669 Obesity, unspecified: Secondary | ICD-10-CM

## 2015-01-28 DIAGNOSIS — I10 Essential (primary) hypertension: Secondary | ICD-10-CM

## 2015-01-28 NOTE — Telephone Encounter (Signed)
Pt coming tomorrow what labs and dx?

## 2015-01-29 ENCOUNTER — Other Ambulatory Visit (INDEPENDENT_AMBULATORY_CARE_PROVIDER_SITE_OTHER): Payer: Medicare Other

## 2015-01-29 DIAGNOSIS — I10 Essential (primary) hypertension: Secondary | ICD-10-CM | POA: Diagnosis not present

## 2015-01-29 DIAGNOSIS — E785 Hyperlipidemia, unspecified: Secondary | ICD-10-CM

## 2015-01-29 LAB — COMPREHENSIVE METABOLIC PANEL
ALBUMIN: 4.2 g/dL (ref 3.5–5.2)
ALT: 20 U/L (ref 0–35)
AST: 23 U/L (ref 0–37)
Alkaline Phosphatase: 77 U/L (ref 39–117)
BUN: 12 mg/dL (ref 6–23)
CALCIUM: 9.5 mg/dL (ref 8.4–10.5)
CHLORIDE: 102 meq/L (ref 96–112)
CO2: 29 meq/L (ref 19–32)
Creatinine, Ser: 0.91 mg/dL (ref 0.40–1.20)
GFR: 65.3 mL/min (ref >60.00)
GLUCOSE: 101 mg/dL — AB (ref 70–99)
POTASSIUM: 3.5 meq/L (ref 3.5–5.1)
SODIUM: 139 meq/L (ref 135–145)
TOTAL PROTEIN: 6.6 g/dL (ref 6.0–8.3)
Total Bilirubin: 0.5 mg/dL (ref 0.2–1.2)

## 2015-01-29 LAB — LIPID PANEL
Cholesterol: 171 mg/dL (ref 0–200)
HDL: 38.5 mg/dL — ABNORMAL LOW (ref >39.00)
NonHDL: 132.5
Total CHOL/HDL Ratio: 4
Triglycerides: 213 mg/dL — ABNORMAL HIGH (ref 0.0–149.0)
VLDL: 42.6 mg/dL — AB (ref 0.0–40.0)

## 2015-01-29 LAB — LDL CHOLESTEROL, DIRECT: Direct LDL: 95 mg/dL

## 2015-01-31 ENCOUNTER — Encounter: Payer: Self-pay | Admitting: Internal Medicine

## 2015-02-05 ENCOUNTER — Encounter: Payer: Self-pay | Admitting: Internal Medicine

## 2015-02-05 ENCOUNTER — Ambulatory Visit (INDEPENDENT_AMBULATORY_CARE_PROVIDER_SITE_OTHER): Payer: Medicare Other | Admitting: Internal Medicine

## 2015-02-05 VITALS — BP 128/68 | HR 54 | Temp 98.1°F | Resp 14 | Ht 66.0 in | Wt 190.2 lb

## 2015-02-05 DIAGNOSIS — Z1239 Encounter for other screening for malignant neoplasm of breast: Secondary | ICD-10-CM | POA: Diagnosis not present

## 2015-02-05 DIAGNOSIS — I10 Essential (primary) hypertension: Secondary | ICD-10-CM

## 2015-02-05 DIAGNOSIS — E038 Other specified hypothyroidism: Secondary | ICD-10-CM

## 2015-02-05 DIAGNOSIS — Z716 Tobacco abuse counseling: Secondary | ICD-10-CM

## 2015-02-05 DIAGNOSIS — E034 Atrophy of thyroid (acquired): Secondary | ICD-10-CM

## 2015-02-05 DIAGNOSIS — E785 Hyperlipidemia, unspecified: Secondary | ICD-10-CM | POA: Diagnosis not present

## 2015-02-05 DIAGNOSIS — E669 Obesity, unspecified: Secondary | ICD-10-CM

## 2015-02-05 MED ORDER — LOSARTAN POTASSIUM-HCTZ 100-25 MG PO TABS: 1.0000 | ORAL_TABLET | Freq: Every day | ORAL | Status: DC

## 2015-02-05 MED ORDER — ATORVASTATIN CALCIUM 80 MG PO TABS: 80.0000 mg | ORAL_TABLET | Freq: Every day | ORAL | Status: DC

## 2015-02-05 NOTE — Patient Instructions (Addendum)
You are doing great!  Try Hair Skin and Nails

## 2015-02-05 NOTE — Progress Notes (Signed)
Pre-visit discussion using our clinic review tool. No additional management support is needed unless otherwise documented below in the visit note.  

## 2015-02-05 NOTE — Progress Notes (Signed)
Subjective:  Patient ID: Alexandra Copeland, female    DOB: 1946/01/31  Age: 69 y.o. MRN: [id number]  CC: The primary encounter diagnosis was Screening for breast cancer. Diagnoses of Essential hypertension, Obesity, Hyperlipidemia, Hypothyroidism due to acquired atrophy of thyroid, and Tobacco abuse counseling were also pertinent to this visit.  HPI Alexandra Copeland presents for follow up on hypertension m hyperlipidemia and obesity  She has lost 12 lbs since last visit .  Fasting lipids improved.  Fasting glucose 101   Goal for bmi is 184,  Personal goal is 150   Feels generally well,  No new complaints,  walking regularly.  Outpatient Prescriptions Prior to Visit  Medication Sig Dispense Refill  . aspirin 325 MG tablet Take 325 mg by mouth daily.    . cholecalciferol (VITAMIN D) 1000 UNITS tablet Take 1,000 Units by mouth daily.    Marland Kitchen levothyroxine (SYNTHROID, LEVOTHROID) 75 MCG tablet TAKE ONE TABLET BY MOUTH ONCE DAILY BEFORE BREAKFAST 30 tablet 11  . Multiple Vitamin (MULTIVITAMIN) tablet Take 1 tablet by mouth daily.    Marland Kitchen atorvastatin (LIPITOR) 80 MG tablet TAKE ONE TABLET BY MOUTH ONCE DAILY 30 tablet 0  . losartan-hydrochlorothiazide (HYZAAR) 100-25 MG per tablet Take 1 tablet by mouth daily. 90 tablet 1  . atorvastatin (LIPITOR) 80 MG tablet Take 1 tablet (80 mg total) by mouth daily. 90 tablet 3  . buPROPion (WELLBUTRIN XL) 150 MG 24 hr tablet Take 1 tablet (150 mg total) by mouth daily. 30 tablet 2  . hydrochlorothiazide (HYDRODIURIL) 25 MG tablet Take 1 tablet (25 mg total) by mouth daily. 30 tablet 0  . losartan (COZAAR) 100 MG tablet Take 1 tablet (100 mg total) by mouth daily. 90 tablet 3  . vitamin E 400 UNIT capsule Take 400 Units by mouth daily.     No facility-administered medications prior to visit.    Review of Systems;  Patient denies headache, fevers, malaise, unintentional weight loss, skin rash, eye pain, sinus congestion and sinus pain, sore throat,  dysphagia,  hemoptysis , cough, dyspnea, wheezing, chest pain, palpitations, orthopnea, edema, abdominal pain, nausea, melena, diarrhea, constipation, flank pain, dysuria, hematuria, urinary  Frequency, nocturia, numbness, tingling, seizures,  Focal weakness, Loss of consciousness,  Tremor, insomnia, depression, anxiety, and suicidal ideation.      Objective:  BP 128/68 mmHg  Pulse 54  Temp(Src) 98.1 F (36.7 C) (Oral)  Resp 14  Ht 5\' 6"  (1.676 m)  Wt 190 lb 4 oz (86.297 kg)  BMI 30.72 kg/m2  SpO2 98%  BP Readings from Last 3 Encounters:  02/05/15 128/68  05/28/14 150/72  04/30/14 168/84    Wt Readings from Last 3 Encounters:  02/05/15 190 lb 4 oz (86.297 kg)  05/28/14 202 lb 8 oz (91.853 kg)  04/30/14 197 lb 8 oz (89.585 kg)    General appearance: alert, cooperative and appears stated age Ears: normal TM's and external ear canals both ears Throat: lips, mucosa, and tongue normal; teeth and gums normal Neck: no adenopathy, no carotid bruit, supple, symmetrical, trachea midline and thyroid not enlarged, symmetric, no tenderness/mass/nodules Back: symmetric, no curvature. ROM normal. No CVA tenderness. Lungs: clear to auscultation bilaterally Heart: regular rate and rhythm, S1, S2 normal, no murmur, click, rub or gallop Abdomen: soft, non-tender; bowel sounds normal; no masses,  no organomegaly Pulses: 2+ and symmetric Skin: Skin color, texture, turgor normal. No rashes or lesions Lymph nodes: Cervical, supraclavicular, and axillary nodes normal.  No results found for: HGBA1C  Lab Results  Component Value Date   CREATININE 0.91 01/29/2015   CREATININE 0.9 06/11/2014   CREATININE 0.9 04/30/2014    Lab Results  Component Value Date   WBC 5.8 04/30/2014   HGB 14.5 04/30/2014   HCT 42.6 04/30/2014   PLT 182.0 04/30/2014   GLUCOSE 101* 01/29/2015   CHOL 171 01/29/2015   TRIG 213.0* 01/29/2015   HDL 38.50* 01/29/2015   LDLDIRECT 95.0 01/29/2015   LDLCALC 90  12/29/2012   ALT 20 01/29/2015   AST 23 01/29/2015   NA 139 01/29/2015   K 3.5 01/29/2015   CL 102 01/29/2015   CREATININE 0.91 01/29/2015   BUN 12 01/29/2015   CO2 29 01/29/2015   TSH 0.57 06/11/2014    No results found.  Assessment & Plan:   Problem List Items Addressed This Visit      Unprioritized   Hypertension    Well controlled on current regimen. Renal function stable, no changes today.  Lab Results  Component Value Date   CREATININE 0.91 01/29/2015   Lab Results  Component Value Date   NA 139 01/29/2015   K 3.5 01/29/2015   CL 102 01/29/2015   CO2 29 01/29/2015         Relevant Medications   losartan-hydrochlorothiazide (HYZAAR) 100-25 MG per tablet   atorvastatin (LIPITOR) 80 MG tablet   Hyperlipidemia    Tolerating potent dose statin therapy.   Liver enzymes are normal , no changes today. Elevated trigs addressed with diet and exercise.   Lab Results  Component Value Date   ALT 20 01/29/2015   AST 23 01/29/2015   ALKPHOS 77 01/29/2015   BILITOT 0.5 01/29/2015   Lab Results  Component Value Date   CHOL 171 01/29/2015   HDL 38.50* 01/29/2015   LDLCALC 90 12/29/2012   LDLDIRECT 95.0 01/29/2015   TRIG 213.0* 01/29/2015   CHOLHDL 4 01/29/2015           Relevant Medications   losartan-hydrochlorothiazide (HYZAAR) 100-25 MG per tablet   atorvastatin (LIPITOR) 80 MG tablet   Tobacco abuse counseling    The patient was counseled on the dangers of tobacco use, and was advised to quit.  Reviewed strategies to maximize success, including removing cigarettes and smoking materials from environment.        Obesity    I have congratulated her in reduction of   BMI and encouraged  Continued weight loss with goal of 10% of body weigh over the next 6 months using a low glycemic index diet and regular exercise a minimum of 5 days per week.        Hypothyroidism    Thyroid function is WNL on current dose.  No current changes needed.   Lab Results    Component Value Date   TSH 0.57 06/11/2014          Other Visit Diagnoses    Screening for breast cancer    -  Primary    Relevant Orders    MM DIGITAL SCREENING BILATERAL       I have discontinued Ms. Rachal's atorvastatin, vitamin E, losartan, buPROPion, and hydrochlorothiazide. I have also changed her atorvastatin. Additionally, I am having her maintain her cholecalciferol, aspirin, multivitamin, levothyroxine, and losartan-hydrochlorothiazide.  Meds ordered this encounter  Medications  . losartan-hydrochlorothiazide (HYZAAR) 100-25 MG per tablet    Sig: Take 1 tablet by mouth daily.    Dispense:  90 tablet    Refill:  1  . atorvastatin (LIPITOR) 80  MG tablet    Sig: Take 1 tablet (80 mg total) by mouth daily.    Dispense:  90 tablet    Refill:  1    Medications Discontinued During This Encounter  Medication Reason  . atorvastatin (LIPITOR) 80 MG tablet Duplicate  . buPROPion (WELLBUTRIN XL) 150 MG 24 hr tablet Patient Preference  . hydrochlorothiazide (HYDRODIURIL) 25 MG tablet Change in therapy  . losartan (COZAAR) 100 MG tablet Change in therapy  . vitamin E 400 UNIT capsule Patient Preference  . losartan-hydrochlorothiazide (HYZAAR) 100-25 MG per tablet Reorder  . atorvastatin (LIPITOR) 80 MG tablet Reorder    Follow-up: Return in about 6 months (around 08/07/2015).   [name], MD

## 2015-02-06 DIAGNOSIS — E039 Hypothyroidism, unspecified: Secondary | ICD-10-CM | POA: Insufficient documentation

## 2015-02-06 NOTE — Assessment & Plan Note (Signed)
Tolerating potent dose statin therapy.   Liver enzymes are normal , no changes today. Elevated trigs addressed with diet and exercise.   Lab Results  Component Value Date   ALT 20 01/29/2015   AST 23 01/29/2015   ALKPHOS 77 01/29/2015   BILITOT 0.5 01/29/2015   Lab Results  Component Value Date   CHOL 171 01/29/2015   HDL 38.50* 01/29/2015   LDLCALC 90 12/29/2012   LDLDIRECT 95.0 01/29/2015   TRIG 213.0* 01/29/2015   CHOLHDL 4 01/29/2015

## 2015-02-06 NOTE — Assessment & Plan Note (Signed)
I have congratulated her in reduction of   BMI and encouraged  Continued weight loss with goal of 10% of body weigh over the next 6 months using a low glycemic index diet and regular exercise a minimum of 5 days per week.    

## 2015-02-06 NOTE — Assessment & Plan Note (Signed)
Thyroid function is WNL on current dose.  No current changes needed.   Lab Results  Component Value Date   TSH 0.57 06/11/2014

## 2015-02-06 NOTE — Assessment & Plan Note (Signed)
The patient was counseled on the dangers of tobacco use, and was advised to quit.  Reviewed strategies to maximize success, including removing cigarettes and smoking materials from environment. 

## 2015-02-06 NOTE — Assessment & Plan Note (Signed)
Well controlled on current regimen. Renal function stable, no changes today.  Lab Results  Component Value Date   CREATININE 0.91 01/29/2015   Lab Results  Component Value Date   NA 139 01/29/2015   K 3.5 01/29/2015   CL 102 01/29/2015   CO2 29 01/29/2015

## 2015-05-17 ENCOUNTER — Other Ambulatory Visit: Payer: Self-pay

## 2015-05-17 ENCOUNTER — Encounter: Payer: Self-pay | Admitting: Internal Medicine

## 2015-05-17 MED ORDER — LEVOTHYROXINE SODIUM 75 MCG PO TABS: ORAL_TABLET | ORAL | Status: DC

## 2015-05-17 NOTE — Telephone Encounter (Signed)
Pt hasnt been in for a TSH since 2015, but last office visit was in 02/05/15. Is it okay to refill?

## 2015-05-17 NOTE — Telephone Encounter (Signed)
Ok to refill,  Refill sent but please call patient and ask her to set up a fasting lab appt. i n December

## 2015-05-20 NOTE — Telephone Encounter (Signed)
She has been scheduled for fasting labs on 07/22/15 @ 930

## 2015-07-22 ENCOUNTER — Other Ambulatory Visit (INDEPENDENT_AMBULATORY_CARE_PROVIDER_SITE_OTHER): Payer: Medicare Other

## 2015-07-22 ENCOUNTER — Telehealth: Payer: Self-pay | Admitting: *Deleted

## 2015-07-22 DIAGNOSIS — E785 Hyperlipidemia, unspecified: Secondary | ICD-10-CM | POA: Diagnosis not present

## 2015-07-22 DIAGNOSIS — I10 Essential (primary) hypertension: Secondary | ICD-10-CM | POA: Diagnosis not present

## 2015-07-22 DIAGNOSIS — E034 Atrophy of thyroid (acquired): Secondary | ICD-10-CM

## 2015-07-22 DIAGNOSIS — E038 Other specified hypothyroidism: Secondary | ICD-10-CM | POA: Diagnosis not present

## 2015-07-22 LAB — LIPID PANEL
CHOL/HDL RATIO: 4
Cholesterol: 153 mg/dL (ref 0–200)
HDL: 41.2 mg/dL (ref >39.00)
LDL CALC: 83 mg/dL (ref 0–99)
NONHDL: 111.86
Triglycerides: 144 mg/dL (ref 0.0–149.0)
VLDL: 28.8 mg/dL (ref 0.0–40.0)

## 2015-07-22 LAB — COMPREHENSIVE METABOLIC PANEL
ALT: 18 U/L (ref 0–35)
AST: 22 U/L (ref 0–37)
Albumin: 4.1 g/dL (ref 3.5–5.2)
Alkaline Phosphatase: 86 U/L (ref 39–117)
BILIRUBIN TOTAL: 0.5 mg/dL (ref 0.2–1.2)
BUN: 16 mg/dL (ref 6–23)
CHLORIDE: 103 meq/L (ref 96–112)
CO2: 30 meq/L (ref 19–32)
CREATININE: 0.91 mg/dL (ref 0.40–1.20)
Calcium: 9.4 mg/dL (ref 8.4–10.5)
GFR: 65.21 mL/min (ref >60.00)
GLUCOSE: 94 mg/dL (ref 70–99)
POTASSIUM: 4 meq/L (ref 3.5–5.1)
SODIUM: 142 meq/L (ref 135–145)
TOTAL PROTEIN: 6.1 g/dL (ref 6.0–8.3)

## 2015-07-22 LAB — TSH: TSH: 2.06 u[IU]/mL (ref 0.35–4.50)

## 2015-07-22 NOTE — Telephone Encounter (Signed)
Labs and dx?  

## 2015-07-26 ENCOUNTER — Encounter: Payer: Self-pay | Admitting: Internal Medicine

## 2015-08-12 ENCOUNTER — Other Ambulatory Visit: Payer: Self-pay | Admitting: Internal Medicine

## 2015-09-05 ENCOUNTER — Other Ambulatory Visit: Payer: Self-pay | Admitting: Internal Medicine

## 2015-10-07 ENCOUNTER — Other Ambulatory Visit: Payer: Self-pay | Admitting: Internal Medicine

## 2015-11-13 ENCOUNTER — Other Ambulatory Visit: Payer: Self-pay | Admitting: Internal Medicine

## 2015-12-11 ENCOUNTER — Ambulatory Visit (INDEPENDENT_AMBULATORY_CARE_PROVIDER_SITE_OTHER): Payer: Medicare Other

## 2015-12-11 ENCOUNTER — Other Ambulatory Visit: Payer: Self-pay

## 2015-12-11 VITALS — BP 132/82 | HR 62 | Temp 98.2°F | Resp 14 | Ht 65.0 in | Wt 190.0 lb

## 2015-12-11 DIAGNOSIS — E785 Hyperlipidemia, unspecified: Secondary | ICD-10-CM

## 2015-12-11 DIAGNOSIS — Z0000 Encounter for general adult medical examination without abnormal findings: Secondary | ICD-10-CM | POA: Diagnosis not present

## 2015-12-11 DIAGNOSIS — Z1239 Encounter for other screening for malignant neoplasm of breast: Secondary | ICD-10-CM

## 2015-12-11 DIAGNOSIS — E039 Hypothyroidism, unspecified: Secondary | ICD-10-CM

## 2015-12-11 DIAGNOSIS — I10 Essential (primary) hypertension: Secondary | ICD-10-CM

## 2015-12-11 DIAGNOSIS — E034 Atrophy of thyroid (acquired): Secondary | ICD-10-CM

## 2015-12-11 NOTE — Patient Instructions (Addendum)
Alexandra Copeland , Thank you for taking time to come for your Medicare Wellness Visit. I appreciate your ongoing commitment to your health goals. Please review the following plan we discussed and let me know if I can assist you in the future.   Return for labs and upcoming annual exam with Dr. Derrel Nip.   This is a list of the screening recommended for you and due dates:  Health Maintenance  Topic Date Due  .  Hepatitis C: One time screening is recommended by Center for Disease Control  (CDC) for  adults born from [birth year] through 1964.   1945/09/20  . Colon Cancer Screening  11/10/1996  . Shingles Vaccine  11/11/2006  . DEXA scan (bone density measurement)  11/11/2011  . Pneumonia vaccines (2 of 2 - PPSV23) 05/30/2015  . Mammogram  09/15/2015  . Flu Shot  03/10/2016  . Tetanus Vaccine  05/28/2020    Bone Densitometry Bone densitometry is an imaging test that uses a special X-ray to measure the amount of calcium and other minerals in your bones (bone density). This test is also known as a bone mineral density test or dual-energy X-ray absorptiometry (DXA). The test can measure bone density at your hip and your spine. It is similar to having a regular X-ray. You may have this test to:  Diagnose a condition that causes weak or thin bones (osteoporosis).  Predict your risk of a broken bone (fracture).  Determine how well osteoporosis treatment is working. LET [facility] CARE PROVIDER KNOW ABOUT:  Any allergies you have.  All medicines you are taking, including vitamins, herbs, eye drops, creams, and over-the-counter medicines.  Previous problems you or members of your family have had with the use of anesthetics.  Any blood disorders you have.  Previous surgeries you have had.  Medical conditions you have.  Possibility of pregnancy.  Any other medical test you had within the previous 14 days that used contrast material. RISKS AND COMPLICATIONS Generally, this is a safe procedure.  However, problems can occur and may include the following:  This test exposes you to a very small amount of radiation.  The risks of radiation exposure may be greater to unborn children. BEFORE THE PROCEDURE  Do not take any calcium supplements for 24 hours before having the test. You can otherwise eat and drink what you usually do.  Take off all metal jewelry, eyeglasses, dental appliances, and any other metal objects. PROCEDURE  You may lie on an exam table. There will be an X-ray generator below you and an imaging device above you.  Other devices, such as boxes or braces, may be used to position your body properly for the scan.  You will need to lie still while the machine slowly scans your body.  The images will show up on a computer monitor. AFTER THE PROCEDURE You may need more testing at a later time.   This information is not intended to replace advice given to you by your health care provider. Make sure you discuss any questions you have with your health care provider.   Document Released: 08/18/2004 Document Revised: 08/17/2014 Document Reviewed: 01/04/2014 Elsevier Interactive Patient Education 2016 [employer].  Colonoscopy A colonoscopy is an exam to look at the entire large intestine (colon). This exam can help find problems such as tumors, polyps, inflammation, and areas of bleeding. The exam takes about 1 hour.  LET [facility] CARE PROVIDER KNOW ABOUT:   Any allergies you have.  All medicines you are  taking, including vitamins, herbs, eye drops, creams, and over-the-counter medicines.  Previous problems you or members of your family have had with the use of anesthetics.  Any blood disorders you have.  Previous surgeries you have had.  Medical conditions you have. RISKS AND COMPLICATIONS  Generally, this is a safe procedure. However, as with any procedure, complications can occur. Possible complications include:  Bleeding.  Tearing or rupture of the  colon wall.  Reaction to medicines given during the exam.  Infection (rare). BEFORE THE PROCEDURE   Ask your health care provider about changing or stopping your regular medicines.  You may be prescribed an oral bowel prep. This involves drinking a large amount of medicated liquid, starting the day before your procedure. The liquid will cause you to have multiple loose stools until your stool is almost clear or light green. This cleans out your colon in preparation for the procedure.  Do not eat or drink anything else once you have started the bowel prep, unless your health care provider tells you it is safe to do so.  Arrange for someone to drive you home after the procedure. PROCEDURE   You will be given medicine to help you relax (sedative).  You will lie on your side with your knees bent.  A long, flexible tube with a light and camera on the end (colonoscope) will be inserted through the rectum and into the colon. The camera sends video back to a computer screen as it moves through the colon. The colonoscope also releases carbon dioxide gas to inflate the colon. This helps your health care provider see the area better.  During the exam, your health care provider may take a small tissue sample (biopsy) to be examined under a microscope if any abnormalities are found.  The exam is finished when the entire colon has been viewed. AFTER THE PROCEDURE   Do not drive for 24 hours after the exam.  You may have a small amount of blood in your stool.  You may pass moderate amounts of gas and have mild abdominal cramping or bloating. This is caused by the gas used to inflate your colon during the exam.  Ask when your test results will be ready and how you will get your results. Make sure you get your test results.   This information is not intended to replace advice given to you by your health care provider. Make sure you discuss any questions you have with your health care provider.    Document Released: 07/24/2000 Document Revised: 05/17/2013 Document Reviewed: 04/03/2013 Elsevier Interactive Patient Education 2016 [place] A mammogram is an X-ray of the breasts that is done to check for abnormal changes. This procedure can screen for and detect any changes that may suggest breast cancer. A mammogram can also identify other changes and variations in the breast, such as:  Inflammation of the breast tissue (mastitis).  An infected area that contains a collection of pus (abscess).  A fluid-filled sac (cyst).  Fibrocystic changes. This is when breast tissue becomes denser, which can make the tissue feel rope-like or uneven under the skin.  Tumors that are not cancerous (benign). LET [facility] CARE PROVIDER KNOW ABOUT:  Any allergies you have.  If you have breast implants.  If you have had previous breast disease, biopsy, or surgery.  If you are breastfeeding.  Any possibility that you could be pregnant, if this applies.  If you are younger than age 32.  If you have  a family history of breast cancer. RISKS AND COMPLICATIONS Generally, this is a safe procedure. However, problems may occur, including:  Exposure to radiation. Radiation levels are very low with this test.  The results being misinterpreted.  The need for further tests.  The inability of the mammogram to detect certain cancers. BEFORE THE PROCEDURE  Schedule your test about 1-2 weeks after your menstrual period. This is usually when your breasts are the least tender.  If you have had a mammogram done at a different facility in the past, get the mammogram X-rays or have them sent to your current exam facility in order to compare them.  Wash your breasts and under your arms the day of the test.  Do not wear deodorants, perfumes, lotions, or powders anywhere on your body on the day of the test.  Remove any jewelry from your neck.  Wear clothes that you can change into and  out of easily. PROCEDURE  You will undress from the waist up and put on a gown.  You will stand in front of the X-ray machine.  Each breast will be placed between two plastic or glass plates. The plates will compress your breast for a few seconds. Try to stay as relaxed as possible during the procedure. This does not cause any harm to your breasts and any discomfort you feel will be very brief.  X-rays will be taken from different angles of each breast. The procedure may vary among health care providers and hospitals. AFTER THE PROCEDURE  The mammogram will be examined by a specialist (radiologist).  You may need to repeat certain parts of the test, depending on the quality of the images. This is commonly done if the radiologist needs a better view of the breast tissue.  Ask when your test results will be ready. Make sure you get your test results.  You may resume your normal activities.   This information is not intended to replace advice given to you by your health care provider. Make sure you discuss any questions you have with your health care provider.   Document Released: 07/24/2000 Document Revised: 04/17/2015 Document Reviewed: 10/05/2014 Elsevier Interactive Patient Education [employer].

## 2015-12-11 NOTE — Progress Notes (Signed)
Subjective:   Alexandra Copeland is a 70 y.o. female who presents for an Initial Medicare Annual Wellness Visit.  Review of Systems    No ROS.  Medicare Wellness Visit.  Cardiac Risk Factors include: advanced age (>47men, >42 women)     Objective:    Today's Vitals   12/11/15 0934  BP: 132/82  Pulse: 62  Temp: 98.2 F (36.8 C)  TempSrc: Oral  Resp: 14  Height: 5\' 5"  (1.651 m)  Weight: 190 lb (86.183 kg)  SpO2: 95%   Body mass index is 31.62 kg/(m^2).   Current Medications (verified) Outpatient Encounter Prescriptions as of 12/11/2015  Medication Sig  . aspirin 325 MG tablet Take 325 mg by mouth daily.  Marland Kitchen atorvastatin (LIPITOR) 80 MG tablet TAKE ONE TABLET BY MOUTH ONCE DAILY  . cholecalciferol (VITAMIN D) 1000 UNITS tablet Take 1,000 Units by mouth daily.  Marland Kitchen levothyroxine (SYNTHROID, LEVOTHROID) 75 MCG tablet TAKE ONE TABLET BY MOUTH ONCE DAILY BEFORE BREAKFAST  . losartan-hydrochlorothiazide (HYZAAR) 100-25 MG tablet TAKE ONE TABLET BY MOUTH ONCE DAILY  . Multiple Vitamin (MULTIVITAMIN) tablet Take 1 tablet by mouth daily.  . ranitidine (ZANTAC) 150 MG tablet Take 150 mg by mouth daily as needed for heartburn.   No facility-administered encounter medications on file as of 12/11/2015.    Allergies (verified) Review of patient's allergies indicates no known allergies.   History: Past Medical History  Diagnosis Date  . Scarlet fever     as a child  . Hypertension   . Hyperlipidemia   . Stroke [facility]) June 2010    left parietal hypertensive  . Hypothyroid    Past Surgical History  Procedure Laterality Date  . Breast surgery  1975    abscess drained   Family History  Problem Relation Age of Onset  . Heart disease Mother   . Hypertension Mother   . Stroke Father   . Hyperlipidemia Father   . Hypertension Brother    Social History   Occupational History  . Not on file.   Social History Main Topics  . Smoking status: Current Every Day Smoker -- 1.00  packs/day    Types: Cigarettes  . Smokeless tobacco: Never Used  . Alcohol Use: 0.6 oz/week    1 Glasses of wine per week     Comment: OCC glass of wine  . Drug Use: No  . Sexual Activity: No    Tobacco Counseling Ready to quit: No Counseling given: Yes   Activities of Daily Living In your present state of health, do you have any difficulty performing the following activities: 12/11/2015  Hearing? N  Vision? N  Difficulty concentrating or making decisions? N  Walking or climbing stairs? N  Dressing or bathing? N  Doing errands, shopping? N  Preparing Food and eating ? N  Using the Toilet? N  In the past six months, have you accidently leaked urine? N  Do you have problems with loss of bowel control? N  Managing your Medications? N  Managing your Finances? N  Housekeeping or managing your Housekeeping? N    Immunizations and Health Maintenance Immunization History  Administered Date(s) Administered  . Influenza,inj,Quad PF,36+ Mos 05/24/2013, 04/30/2014  . PPD Test 09/18/2014  . Pneumococcal Conjugate-13 05/28/2014  . Pneumococcal Polysaccharide-23 05/29/2010  . Tdap 05/28/2010   Health Maintenance Due  Topic Date Due  . Hepatitis C Screening  [birth year]  . COLONOSCOPY  11/10/1996  . ZOSTAVAX  11/11/2006  . DEXA SCAN  11/11/2011  .  PNA vac Low Risk Adult (2 of 2 - PPSV23) 05/30/2015  . MAMMOGRAM  09/15/2015    Patient Care Team: [name], MD as PCP - General (Internal Medicine)  Indicate any recent Medical Services you may have received from other than [facility] providers in the past year (date may be approximate).     Assessment:   This is a routine wellness examination for [place]. The goal of the wellness visit is to assist the patient how to close the gaps in care and create a preventative care plan for the patient.   Taking VIT D as appropriate/Osteoporosis reviewed.Self reports having bone density in 2009.  DEXA Scan Deferred at this time.  Educational material provided.  Medications reviewed; taking without issues or barriers.  Safety issues reviewed; smoke detectors in the home. No firearms in the home. Wears seatbelts when driving or riding with others. No violence in the home.  No identified risk were noted; The patient was oriented x 3; appropriate in dress and manner and no objective failures at ADL's or IADL's.   Mammogram-order placed.  Educational material provided.   Colonoscopy-deferred. COLOguard discussed; educational material provided.  Follow up with PCP.  PNA 23 vaccine booster; postponed for annual exam, per patient request.  Smoking cessation; discussed.  Not ready to quit.  Encouraged to smoke less daily.  Educational material provided.  Follow up with PCP.  Hepatitis C Screening; agreed to add on to upcoming labs.  Educational material provided.  Obesity; discussed the need for a healthy diet and exercise.  Educational material provided.  Depress psychosis u- stable and followed by PCP.  Patient Concerns:  Difficulty sleeping x1 month.  Dry skin on legs. Deferred to follow up with PCP.  Need for annual exam.  Appointment scheduled; labs ordered.    Hearing/Vision screen Hearing Screening Comments: Passes the whisper test Hx of L eardrum trauma ENT deferred  Vision Screening Comments: Followed by [facility] Wears glasses  Dietary issues and exercise activities discussed: Current Exercise Habits: Home exercise routine, Type of exercise: walking (Yard work), Frequency (Times/Week): 5, Intensity: Moderate  Goals    . Healthy Lifestyle     Stay hydrated and drink plenty of fluids. Low carb foods.  Lean meats and vegetables. Stay active and continue to exercise.      Depression Screen PHQ 2/9 Scores 12/11/2015 02/05/2015 05/02/2014  PHQ - 2 Score 0 0 2  PHQ- 9 Score - - 5    Fall Risk Fall Risk  12/11/2015 02/05/2015 05/02/2014  Falls in the past year? No No No    Cognitive  Function: MMSE - Mini Mental State Exam 12/11/2015  Orientation to time 5  Orientation to Place 5  Registration 3  Attention/ Calculation 5  Recall 3  Language- name 2 objects 2  Language- repeat 1  Language- follow 3 step command 3  Language- read & follow direction 1  Write a sentence 1  Copy design 1  Total score 30    Screening Tests Health Maintenance  Topic Date Due  . Hepatitis C Screening  1946-05-11  . COLONOSCOPY  11/10/1996  . ZOSTAVAX  11/11/2006  . DEXA SCAN  11/11/2011  . PNA vac Low Risk Adult (2 of 2 - PPSV23) 05/30/2015  . MAMMOGRAM  09/15/2015  . INFLUENZA VACCINE  03/10/2016  . TETANUS/TDAP  05/28/2020      Plan:   End of life planning; Advance aging; Advanced directives discussed. Copy of current HCPOA/Living Will requested.  During the course of the visit, Jazmina was educated and counseled about the following appropriate screening and preventive services:   Vaccines to include Pneumoccal, Influenza, Hepatitis B, Td, Zostavax, HCV  Electrocardiogram  Cardiovascular disease screening  Colorectal cancer screening  Bone density screening  Diabetes screening  Glaucoma screening  Mammography/PAP  Nutrition counseling  Smoking cessation counseling  Patient Instructions (the written plan) were given to the patient.    Varney Biles, LPN   [phone]

## 2015-12-13 NOTE — Progress Notes (Signed)
  I have reviewed the above information and agree with above.   Eloina Ergle, MD 

## 2015-12-16 ENCOUNTER — Encounter: Payer: Self-pay | Admitting: Internal Medicine

## 2016-01-02 ENCOUNTER — Telehealth: Payer: Self-pay | Admitting: *Deleted

## 2016-01-02 ENCOUNTER — Other Ambulatory Visit (INDEPENDENT_AMBULATORY_CARE_PROVIDER_SITE_OTHER): Payer: Medicare Other

## 2016-01-02 DIAGNOSIS — E038 Other specified hypothyroidism: Secondary | ICD-10-CM

## 2016-01-02 DIAGNOSIS — R5383 Other fatigue: Secondary | ICD-10-CM | POA: Diagnosis not present

## 2016-01-02 DIAGNOSIS — E785 Hyperlipidemia, unspecified: Secondary | ICD-10-CM | POA: Diagnosis not present

## 2016-01-02 DIAGNOSIS — I10 Essential (primary) hypertension: Secondary | ICD-10-CM

## 2016-01-02 DIAGNOSIS — E034 Atrophy of thyroid (acquired): Secondary | ICD-10-CM

## 2016-01-02 LAB — COMPREHENSIVE METABOLIC PANEL
ALBUMIN: 4.4 g/dL (ref 3.5–5.2)
ALT: 20 U/L (ref 0–35)
AST: 23 U/L (ref 0–37)
Alkaline Phosphatase: 86 U/L (ref 39–117)
BUN: 13 mg/dL (ref 6–23)
CALCIUM: 9.7 mg/dL (ref 8.4–10.5)
CHLORIDE: 104 meq/L (ref 96–112)
CO2: 31 mEq/L (ref 19–32)
Creatinine, Ser: 0.9 mg/dL (ref 0.40–1.20)
GFR: 65.96 mL/min (ref >60.00)
Glucose, Bld: 110 mg/dL — ABNORMAL HIGH (ref 70–99)
POTASSIUM: 3.6 meq/L (ref 3.5–5.1)
Sodium: 140 mEq/L (ref 135–145)
Total Bilirubin: 0.5 mg/dL (ref 0.2–1.2)
Total Protein: 6.7 g/dL (ref 6.0–8.3)

## 2016-01-02 LAB — LIPID PANEL
CHOLESTEROL: 173 mg/dL (ref 0–200)
HDL: 38 mg/dL — ABNORMAL LOW (ref >39.00)
LDL CALC: 98 mg/dL (ref 0–99)
NonHDL: 135.11
TRIGLYCERIDES: 187 mg/dL — AB (ref 0.0–149.0)
Total CHOL/HDL Ratio: 5
VLDL: 37.4 mg/dL (ref 0.0–40.0)

## 2016-01-02 LAB — TSH: TSH: 3.95 u[IU]/mL (ref 0.35–4.50)

## 2016-01-02 NOTE — Telephone Encounter (Signed)
Hep c added thanks

## 2016-01-02 NOTE — Addendum Note (Signed)
Addended by: Karlene Einstein D on: 01/02/2016 10:31 AM   Modules accepted: Orders

## 2016-01-02 NOTE — Telephone Encounter (Signed)
Pt asked about the hep c?

## 2016-01-03 ENCOUNTER — Ambulatory Visit
Admission: RE | Admit: 2016-01-03 | Discharge: 2016-01-03 | Disposition: A | Discharge status: Home / Self Care | Payer: Medicare Other | Source: Ambulatory Visit | Attending: Internal Medicine | Admitting: Internal Medicine

## 2016-01-03 ENCOUNTER — Other Ambulatory Visit: Payer: Self-pay | Admitting: Internal Medicine

## 2016-01-03 DIAGNOSIS — Z1231 Encounter for screening mammogram for malignant neoplasm of breast: Secondary | ICD-10-CM

## 2016-01-03 DIAGNOSIS — Z1239 Encounter for other screening for malignant neoplasm of breast: Secondary | ICD-10-CM

## 2016-01-03 LAB — HEPATITIS C ANTIBODY: HCV AB: NEGATIVE

## 2016-01-06 ENCOUNTER — Encounter: Payer: Self-pay | Admitting: Internal Medicine

## 2016-01-09 ENCOUNTER — Ambulatory Visit (INDEPENDENT_AMBULATORY_CARE_PROVIDER_SITE_OTHER): Payer: Medicare Other | Admitting: Internal Medicine

## 2016-01-09 ENCOUNTER — Encounter: Payer: Self-pay | Admitting: Internal Medicine

## 2016-01-09 VITALS — BP 140/78 | HR 65 | Temp 98.0°F | Ht 65.5 in | Wt 189.8 lb

## 2016-01-09 DIAGNOSIS — E559 Vitamin D deficiency, unspecified: Secondary | ICD-10-CM | POA: Insufficient documentation

## 2016-01-09 DIAGNOSIS — Z780 Asymptomatic menopausal state: Secondary | ICD-10-CM | POA: Diagnosis not present

## 2016-01-09 DIAGNOSIS — H6120 Impacted cerumen, unspecified ear: Secondary | ICD-10-CM | POA: Insufficient documentation

## 2016-01-09 DIAGNOSIS — Z716 Tobacco abuse counseling: Secondary | ICD-10-CM

## 2016-01-09 DIAGNOSIS — E119 Type 2 diabetes mellitus without complications: Secondary | ICD-10-CM | POA: Insufficient documentation

## 2016-01-09 DIAGNOSIS — R7303 Prediabetes: Secondary | ICD-10-CM

## 2016-01-09 DIAGNOSIS — E785 Hyperlipidemia, unspecified: Secondary | ICD-10-CM

## 2016-01-09 DIAGNOSIS — H6122 Impacted cerumen, left ear: Secondary | ICD-10-CM | POA: Diagnosis not present

## 2016-01-09 DIAGNOSIS — E1159 Type 2 diabetes mellitus with other circulatory complications: Secondary | ICD-10-CM | POA: Insufficient documentation

## 2016-01-09 DIAGNOSIS — E038 Other specified hypothyroidism: Secondary | ICD-10-CM

## 2016-01-09 DIAGNOSIS — E669 Obesity, unspecified: Secondary | ICD-10-CM | POA: Insufficient documentation

## 2016-01-09 DIAGNOSIS — I152 Hypertension secondary to endocrine disorders: Secondary | ICD-10-CM | POA: Insufficient documentation

## 2016-01-09 DIAGNOSIS — L989 Disorder of the skin and subcutaneous tissue, unspecified: Secondary | ICD-10-CM

## 2016-01-09 DIAGNOSIS — E034 Atrophy of thyroid (acquired): Secondary | ICD-10-CM

## 2016-01-09 DIAGNOSIS — I10 Essential (primary) hypertension: Secondary | ICD-10-CM

## 2016-01-09 LAB — VITAMIN D 25 HYDROXY (VIT D DEFICIENCY, FRACTURES): VITD: 46.51 ng/mL (ref 30.00–100.00)

## 2016-01-09 LAB — HEMOGLOBIN A1C: HEMOGLOBIN A1C: 5.7 % (ref 4.6–6.5)

## 2016-01-09 NOTE — Patient Instructions (Addendum)
Try using Debrox in your left ear .  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  Please return for an RN visit [place] BP MONITOR so we can confirm   To make a low carb chip :  Take the Joseph's Lavash or Pita bread,  Or the [place] Low carb whole wheat tortilla   Place on metal cookie sheet  Brush with olive oil  Sprinkle garlic powder (NOT garlic salt), grated parmesan cheese, mediterranean seasoning , or all of them?  Bake at 275 for 30 minutes   We have substitutions for your potatoes!!  Try the mashed cauliflower and riced cauliflower dishes instead of rice and mashed potatoes  Mashed turnips are also very low carb!   For desserts :  Try the Dannon Lt n Fit greek yogurt dessert flavors and top with reddi Whip .  8 carbs,  80 calories  Try Oikos Triple Zero [place] Yogurt in the salted caramel, and the coffee flavors  With Whipped Cream for dessert  breyer's low carb ice cream, available in bars (on a stick, better ) or scoopable ice cream  HERE ARE THE LOW CARB  BREAD CHOICES

## 2016-01-09 NOTE — Progress Notes (Signed)
Subjective:  Patient ID: Alexandra Copeland, female    DOB: March 05, 1946  Age: 70 y.o. MRN: [id number]  CC: The primary encounter diagnosis was Prediabetes. Diagnoses of Vitamin D deficiency, Postmenopausal estrogen deficiency, Cerumen impaction, left, Essential hypertension, Hyperlipidemia, Hypothyroidism due to acquired atrophy of thyroid, Tobacco abuse counseling, and Skin lesion of chest wall were also pertinent to this visit.  HPI AALYAH Copeland presents for follow up on hypertension , hyperlipidemia with history of CVA , and several acute issues.   Recent labs reviewed,  With discussion of her fasting glucose being elevated at 110.  She has no history of DM but has a strong FH of type 2 DM.     1) Left ear has been "bothering"  her.   History of barotruma remotely.. Since then it has periodically felt full.  denies pain , but currently feels  like it is full of wax.    2) Hyperpigmented spot on upper portion of her right chest wall,  Has changed recently and grown in size. she has no prior dermatology referral   Outpatient Prescriptions Prior to Visit  Medication Sig Dispense Refill  . aspirin 325 MG tablet Take 325 mg by mouth daily.    Marland Kitchen atorvastatin (LIPITOR) 80 MG tablet TAKE ONE TABLET BY MOUTH ONCE DAILY 90 tablet 2  . cholecalciferol (VITAMIN D) 1000 UNITS tablet Take 1,000 Units by mouth daily.    Marland Kitchen levothyroxine (SYNTHROID, LEVOTHROID) 75 MCG tablet TAKE ONE TABLET BY MOUTH ONCE DAILY BEFORE BREAKFAST 90 tablet 1  . losartan-hydrochlorothiazide (HYZAAR) 100-25 MG tablet TAKE ONE TABLET BY MOUTH ONCE DAILY 90 tablet 0  . Multiple Vitamin (MULTIVITAMIN) tablet Take 1 tablet by mouth daily.    . ranitidine (ZANTAC) 150 MG tablet Take 150 mg by mouth daily as needed for heartburn.     No facility-administered medications prior to visit.    Review of Systems;  Patient denies headache, fevers, malaise, unintentional weight loss, skin rash, eye pain, sinus congestion and sinus  pain, sore throat, dysphagia,  hemoptysis , cough, dyspnea, wheezing, chest pain, palpitations, orthopnea, edema, abdominal pain, nausea, melena, diarrhea, constipation, flank pain, dysuria, hematuria, urinary  Frequency, nocturia, numbness, tingling, seizures,  Focal weakness, Loss of consciousness,  Tremor, insomnia, depression, anxiety, and suicidal ideation.      Objective:  BP 140/78 mmHg  Pulse 65  Temp(Src) 98 F (36.7 C)  Ht 5' 5.5" (1.664 m)  Wt 189 lb 12.8 oz (86.093 kg)  BMI 31.09 kg/m2  SpO2 97%  BP Readings from Last 3 Encounters:  01/09/16 140/78  12/11/15 132/82  02/05/15 128/68    Wt Readings from Last 3 Encounters:  01/09/16 189 lb 12.8 oz (86.093 kg)  12/11/15 190 lb (86.183 kg)  02/05/15 190 lb 4 oz (86.297 kg)    General appearance: alert, cooperative and appears stated age Ears: normal TM's and external ear canals both ears Throat: lips, mucosa, and tongue normal; teeth and gums normal Neck: no adenopathy, no carotid bruit, supple, symmetrical, trachea midline and thyroid not enlarged, symmetric, no tenderness/mass/nodules Back: symmetric, no curvature. ROM normal. No CVA tenderness. Lungs: clear to auscultation bilaterally Heart: regular rate and rhythm, S1, S2 normal, no murmur, click, rub or gallop Abdomen: soft, non-tender; bowel sounds normal; no masses,  no organomegaly Pulses: 2+ and symmetric Skin: Skin color, texture, turgor normal. No rashes or lesions Lymph nodes: Cervical, supraclavicular, and axillary nodes normal.  Lab Results  Component Value Date   HGBA1C 5.7 01/09/2016  Lab Results  Component Value Date   CREATININE 0.90 01/02/2016   CREATININE 0.91 07/22/2015   CREATININE 0.91 01/29/2015    Lab Results  Component Value Date   WBC 5.8 04/30/2014   HGB 14.5 04/30/2014   HCT 42.6 04/30/2014   PLT 182.0 04/30/2014   GLUCOSE 110* 01/02/2016   CHOL 173 01/02/2016   TRIG 187.0* 01/02/2016   HDL 38.00* 01/02/2016   LDLDIRECT  95.0 01/29/2015   LDLCALC 98 01/02/2016   ALT 20 01/02/2016   AST 23 01/02/2016   NA 140 01/02/2016   K 3.6 01/02/2016   CL 104 01/02/2016   CREATININE 0.90 01/02/2016   BUN 13 01/02/2016   CO2 31 01/02/2016   TSH 3.95 01/02/2016   HGBA1C 5.7 01/09/2016    Mm Screening Breast Tomo Bilateral  01/03/2016  CLINICAL DATA:  Screening. EXAM: 2D DIGITAL SCREENING BILATERAL MAMMOGRAM WITH CAD AND ADJUNCT TOMO COMPARISON:  Previous exam(s). ACR Breast Density Category b: There are scattered areas of fibroglandular density. FINDINGS: There are no findings suspicious for malignancy. Images were processed with CAD. IMPRESSION: No mammographic evidence of malignancy. A result letter of this screening mammogram will be mailed directly to the patient. RECOMMENDATION: Screening mammogram in one year. (Code:SM-B-01Y) BI-RADS CATEGORY  1: Negative. Electronically Signed   By: Everlean Alstrom M.D.   On: 01/03/2016 12:29    Assessment & Plan:   Problem List Items Addressed This Visit    Hyperlipidemia    Tolerating potent dose statin therapy.   Liver enzymes are normal , no changes today. Elevated trigs addressed with diet and exercise.   Lab Results  Component Value Date   ALT 20 01/02/2016   AST 23 01/02/2016   ALKPHOS 86 01/02/2016   BILITOT 0.5 01/02/2016   Lab Results  Component Value Date   CHOL 173 01/02/2016   HDL 38.00* 01/02/2016   LDLCALC 98 01/02/2016   LDLDIRECT 95.0 01/29/2015   TRIG 187.0* 01/02/2016   CHOLHDL 5 01/02/2016             Tobacco abuse counseling    The patient was counseled on the dangers of tobacco use, and was advised to quit.  Reviewed strategies to maximize success, including removing cigarettes and smoking materials from environment.        Hypothyroidism    Thyroid function is WNL on current dose.  No current changes needed.   Lab Results  Component Value Date   TSH 3.95 01/02/2016           Prediabetes - Primary    Fasting glucose was  110 ,  checking a1c today and it is < 5.8 Cautioned to increase exercie and follow low GI diet. .  There is a strong  FH of type 2 DM.  Lab Results  Component Value Date   HGBA1C 5.7 01/09/2016         Relevant Orders   Hemoglobin A1c (Completed)   Skin lesion of chest wall    Referral to dermatology given change in size recently,       Relevant Orders   Ambulatory referral to Dermatology   Hypertension    Mild elevation noted today .  Home readings have been 120/70 , whichis our goal.  Asked to return in one week with home machine so it can be compared to office readings       Vitamin D deficiency    Currently taking 1000 Ius daily,  afer taking [place]  MONTHS      Relevant Orders   VITAMIN D 25 Hydroxy (Vit-D Deficiency, Fractures) (Completed)   Cerumen impaction    Left ear only  Advised to use Debrox nightly for one week,  Return for irrigation if no improvement        Other Visit Diagnoses    Postmenopausal estrogen deficiency        Relevant Orders    DG Bone Density      A total of 40 minutes was spent with patient more than half of which was spent in counseling patient on the above mentioned issues , reviewing and explaining recent labs and imaging studies done, and coordination of care. I am having Ms. Talaga maintain her cholecalciferol, aspirin, multivitamin, atorvastatin, levothyroxine, losartan-hydrochlorothiazide, and ranitidine.  No orders of the defined types were placed in this encounter.    There are no discontinued medications.  Follow-up: Return for RN visit one week: BP check and left erar irrigation .   [name], MD

## 2016-01-09 NOTE — Assessment & Plan Note (Signed)
Left ear only  Advised to use Debrox nightly for one week,  Return for irrigation if no improvement

## 2016-01-09 NOTE — Assessment & Plan Note (Signed)
Currently taking 1000 Ius daily,  afer taking 2000 Ius DAILY FOR THE WINTER MONTHS

## 2016-01-09 NOTE — Assessment & Plan Note (Addendum)
Fasting glucose was 110 ,  checking a1c today and it is < 5.8 Cautioned to increase exercie and follow low GI diet. .  There is a strong  FH of type 2 DM.  Lab Results  Component Value Date   HGBA1C 5.7 01/09/2016

## 2016-01-09 NOTE — Assessment & Plan Note (Signed)
Mild elevation noted today .  Home readings have been 120/70 , whichis our goal.  Asked to return in one week with home machine so it can be compared to office readings

## 2016-01-11 ENCOUNTER — Encounter: Payer: Self-pay | Admitting: Internal Medicine

## 2016-01-11 DIAGNOSIS — L989 Disorder of the skin and subcutaneous tissue, unspecified: Secondary | ICD-10-CM | POA: Insufficient documentation

## 2016-01-11 NOTE — Assessment & Plan Note (Signed)
Tolerating potent dose statin therapy.   Liver enzymes are normal , no changes today. Elevated trigs addressed with diet and exercise.   Lab Results  Component Value Date   ALT 20 01/02/2016   AST 23 01/02/2016   ALKPHOS 86 01/02/2016   BILITOT 0.5 01/02/2016   Lab Results  Component Value Date   CHOL 173 01/02/2016   HDL 38.00* 01/02/2016   LDLCALC 98 01/02/2016   LDLDIRECT 95.0 01/29/2015   TRIG 187.0* 01/02/2016   CHOLHDL 5 01/02/2016

## 2016-01-11 NOTE — Assessment & Plan Note (Signed)
Referral to dermatology given change in size recently,

## 2016-01-11 NOTE — Assessment & Plan Note (Signed)
The patient was counseled on the dangers of tobacco use, and was advised to quit.  Reviewed strategies to maximize success, including removing cigarettes and smoking materials from environment. 

## 2016-01-11 NOTE — Assessment & Plan Note (Signed)
Thyroid function is WNL on current dose.  No current changes needed.   Lab Results  Component Value Date   TSH 3.95 01/02/2016

## 2016-01-12 ENCOUNTER — Encounter: Payer: Self-pay | Admitting: Internal Medicine

## 2016-01-15 ENCOUNTER — Encounter: Payer: Self-pay | Admitting: Internal Medicine

## 2016-02-04 ENCOUNTER — Other Ambulatory Visit: Payer: Self-pay | Admitting: Internal Medicine

## 2016-03-26 ENCOUNTER — Ambulatory Visit
Admission: RE | Admit: 2016-03-26 | Discharge: 2016-03-26 | Disposition: A | Discharge status: Home / Self Care | Payer: Medicare Other | Source: Ambulatory Visit | Attending: Internal Medicine | Admitting: Internal Medicine

## 2016-03-26 DIAGNOSIS — Z13820 Encounter for screening for osteoporosis: Secondary | ICD-10-CM | POA: Insufficient documentation

## 2016-03-26 DIAGNOSIS — Z780 Asymptomatic menopausal state: Secondary | ICD-10-CM

## 2016-03-26 DIAGNOSIS — R29890 Loss of height: Secondary | ICD-10-CM | POA: Diagnosis not present

## 2016-03-29 ENCOUNTER — Encounter: Payer: Self-pay | Admitting: Internal Medicine

## 2016-04-07 ENCOUNTER — Other Ambulatory Visit: Payer: Self-pay | Admitting: Internal Medicine

## 2016-04-10 DIAGNOSIS — L821 Other seborrheic keratosis: Secondary | ICD-10-CM | POA: Diagnosis not present

## 2016-06-13 ENCOUNTER — Other Ambulatory Visit: Payer: Self-pay | Admitting: Internal Medicine

## 2016-07-27 ENCOUNTER — Other Ambulatory Visit: Payer: Self-pay

## 2016-07-27 MED ORDER — LEVOTHYROXINE SODIUM 75 MCG PO TABS: 75.0000 ug | ORAL_TABLET | Freq: Every day | ORAL | 0 refills | Status: DC

## 2016-07-27 NOTE — Telephone Encounter (Signed)
Rx Levothyroxine Reorder and sent to pharmacy # [phone] with 0 refills

## 2016-11-09 ENCOUNTER — Other Ambulatory Visit: Payer: Self-pay | Admitting: Internal Medicine

## 2016-12-10 ENCOUNTER — Ambulatory Visit (INDEPENDENT_AMBULATORY_CARE_PROVIDER_SITE_OTHER): Payer: Medicare Other

## 2016-12-10 VITALS — BP 140/78 | HR 62 | Temp 98.1°F | Resp 12 | Ht 66.0 in | Wt 193.4 lb

## 2016-12-10 DIAGNOSIS — Z0000 Encounter for general adult medical examination without abnormal findings: Secondary | ICD-10-CM

## 2016-12-10 NOTE — Patient Instructions (Addendum)
Alexandra Copeland , Thank you for taking time to come for your Medicare Wellness Visit. I appreciate your ongoing commitment to your health goals. Please review the following plan we discussed and let me know if I can assist you in the future.   Follow up with Dr. Derrel Nip as needed.    Consider Colonoscopy/COLOguard   Consider Pneumovax 23 vaccine  Bring a copy of your [place] and/or Living Will to be scanned into chart.  Have a great day!  These are the goals we discussed: Goals    . Healthy Lifestyle          Stay hydrated and drink plenty of fluids. Low carb foods.  Lean meats and vegetables. Stay active and continue to exercise.    . Quit smoking / using tobacco          Educational material provided       This is a list of the screening recommended for you and due dates:  Health Maintenance  Topic Date Due  . Colon Cancer Screening  11/10/1996  . Pneumonia vaccines (2 of 2 - PPSV23) 05/30/2015  . Flu Shot  03/10/2017  . Mammogram  01/02/2018  . Tetanus Vaccine  05/28/2020  . DEXA scan (bone density measurement)  Completed  .  Hepatitis C: One time screening is recommended by Center for Disease Control  (CDC) for  adults born from [birth year] through 1964.   Completed      Steps to Quit Smoking Smoking tobacco can be harmful to your health and can affect almost every organ in your body. Smoking puts you, and those around you, at risk for developing many serious chronic diseases. Quitting smoking is difficult, but it is one of the best things that you can do for your health. It is never too late to quit. What are the benefits of quitting smoking? When you quit smoking, you lower your risk of developing serious diseases and conditions, such as:  Lung cancer or lung disease, such as COPD.  Heart disease.  Stroke.  Heart attack.  Infertility.  Osteoporosis and bone fractures. Additionally, symptoms such as coughing, wheezing, and shortness of breath  may get better when you quit. You may also find that you get sick less often because your body is stronger at fighting off colds and infections. If you are pregnant, quitting smoking can help to reduce your chances of having a baby of low birth weight. How do I get ready to quit? When you decide to quit smoking, create a plan to make sure that you are successful. Before you quit:  Pick a date to quit. Set a date within the next two weeks to give you time to prepare.  Write down the reasons why you are quitting. Keep this list in places where you will see it often, such as on your bathroom mirror or in your car or wallet.  Identify the people, places, things, and activities that make you want to smoke (triggers) and avoid them. Make sure to take these actions:  Throw away all cigarettes at home, at work, and in your car.  Throw away smoking accessories, such as Scientist, research (medical).  Clean your car and make sure to empty the ashtray.  Clean your home, including curtains and carpets.  Tell your family, friends, and coworkers that you are quitting. Support from your loved ones can make quitting easier.  Talk with your health care provider about your options for quitting smoking.  Find  out what treatment options are covered by your health insurance. What strategies can I use to quit smoking? Talk with your healthcare provider about different strategies to quit smoking. Some strategies include:  Quitting smoking altogether instead of gradually lessening how much you smoke over a period of time. Research shows that quitting "cold [place]" is more successful than gradually quitting.  Attending in-person counseling to help you build problem-solving skills. You are more likely to have success in quitting if you attend several counseling sessions. Even short sessions of 10 minutes can be effective.  Finding resources and support systems that can help you to quit smoking and remain smoke-free  after you quit. These resources are most helpful when you use them often. They can include:  Online chats with a Social worker.  Telephone quitlines.  Printed Furniture conservator/restorer.  Support groups or group counseling.  Text messaging programs.  Mobile phone applications.  Taking medicines to help you quit smoking. (If you are pregnant or breastfeeding, talk with your health care provider first.) Some medicines contain nicotine and some do not. Both types of medicines help with cravings, but the medicines that include nicotine help to relieve withdrawal symptoms. Your health care provider may recommend:  Nicotine patches, gum, or lozenges.  Nicotine inhalers or sprays.  Non-nicotine medicine that is taken by mouth. Talk with your health care provider about combining strategies, such as taking medicines while you are also receiving in-person counseling. Using these two strategies together makes you more likely to succeed in quitting than if you used either strategy on its own. If you are pregnant or breastfeeding, talk with your health care provider about finding counseling or other support strategies to quit smoking. Do not take medicine to help you quit smoking unless told to do so by your health care provider. What things can I do to make it easier to quit? Quitting smoking might feel overwhelming at first, but there is a lot that you can do to make it easier. Take these important actions:  Reach out to your family and friends and ask that they support and encourage you during this time. Call telephone quitlines, reach out to support groups, or work with a counselor for support.  Ask people who smoke to avoid smoking around you.  Avoid places that trigger you to smoke, such as bars, parties, or smoke-break areas at work.  Spend time around people who do not smoke.  Lessen stress in your life, because stress can be a smoking trigger for some people. To lessen stress, try:  Exercising  regularly.  Deep-breathing exercises.  Yoga.  Meditating.  Performing a body scan. This involves closing your eyes, scanning your body from head to toe, and noticing which parts of your body are particularly tense. Purposefully relax the muscles in those areas.  Download or purchase mobile phone or tablet apps (applications) that can help you stick to your quit plan by providing reminders, tips, and encouragement. There are many free apps, such as QuitGuide from the [employer] Office manager for Disease Control and Prevention). You can find other support for quitting smoking (smoking cessation) through smokefree.gov and other websites. How will I feel when I quit smoking? Within the first 24 hours of quitting smoking, you may start to feel some withdrawal symptoms. These symptoms are usually most noticeable 2-3 days after quitting, but they usually do not last beyond 2-3 weeks. Changes or symptoms that you might experience include:  Mood swings.  Restlessness, anxiety, or irritation.  Difficulty concentrating.  Dizziness.  Strong cravings for sugary foods in addition to nicotine.  Mild weight gain.  Constipation.  Nausea.  Coughing or a sore throat.  Changes in how your medicines work in your body.  A depressed mood.  Difficulty sleeping (insomnia). After the first 2-3 weeks of quitting, you may start to notice more positive results, such as:  Improved sense of smell and taste.  Decreased coughing and sore throat.  Slower heart rate.  Lower blood pressure.  Clearer skin.  The ability to breathe more easily.  Fewer sick days. Quitting smoking is very challenging for most people. Do not get discouraged if you are not successful the first time. Some people need to make many attempts to quit before they achieve long-term success. Do your best to stick to your quit plan, and talk with your health care provider if you have any questions or concerns. This information is not intended  to replace advice given to you by your health care provider. Make sure you discuss any questions you have with your health care provider. Document Released: 07/21/2001 Document Revised: 03/24/2016 Document Reviewed: 12/11/2014 Elsevier Interactive Patient Education  2017 [employer].

## 2016-12-10 NOTE — Progress Notes (Signed)
Subjective:   Alexandra Copeland is a 71 y.o. female who presents for Medicare Annual (Subsequent) preventive examination.  Review of Systems:  No ROS.  Medicare Wellness Visit.  Cardiac Risk Factors include: advanced age (>68men, >42 women);obesity (BMI >30kg/m2);hypertension;smoking/ tobacco exposure     Objective:     Vitals: BP 140/78 (BP Location: Left Arm, Patient Position: Sitting, Cuff Size: Normal)   Pulse 62   Temp 98.1 F (36.7 C) (Oral)   Resp 12   Ht 5\' 6"  (1.676 m)   Wt 193 lb 6.4 oz (87.7 kg)   SpO2 96%   BMI 31.22 kg/m   Body mass index is 31.22 kg/m.   Tobacco History  Smoking Status  . Current Every Day Smoker  . Packs/day: 1.00  . Types: Cigarettes  Smokeless Tobacco  . Never Used     Ready to quit: Yes Counseling given: Yes   Past Medical History:  Diagnosis Date  . Hyperlipidemia   . Hypertension   . Hypothyroid   . Scarlet fever    as a child  . Stroke [facility]) June 2010   left parietal hypertensive   Past Surgical History:  Procedure Laterality Date  . BREAST CYST ASPIRATION Left 1978   neg  . BREAST SURGERY  1975   abscess drained   Family History  Problem Relation Age of Onset  . Heart disease Mother   . Hypertension Mother   . Stroke Father   . Hyperlipidemia Father   . Hypertension Brother    History  Sexual Activity  . Sexual activity: No    Outpatient Encounter Prescriptions as of 12/10/2016  Medication Sig  . aspirin 325 MG tablet Take 325 mg by mouth daily.  Marland Kitchen atorvastatin (LIPITOR) 80 MG tablet TAKE ONE TABLET BY MOUTH ONCE DAILY  . cholecalciferol (VITAMIN D) 1000 UNITS tablet Take 1,000 Units by mouth daily.  Marland Kitchen levothyroxine (SYNTHROID, LEVOTHROID) 75 MCG tablet Take 1 tablet (75 mcg total) by mouth daily before breakfast.  . losartan-hydrochlorothiazide (HYZAAR) 100-25 MG tablet TAKE ONE TABLET BY MOUTH ONCE DAILY  . Multiple Vitamin (MULTIVITAMIN) tablet Take 1 tablet by mouth daily.  . ranitidine (ZANTAC)  75 MG tablet Take 75 mg by mouth daily.    No facility-administered encounter medications on file as of 12/10/2016.     Activities of Daily Living In your present state of health, do you have any difficulty performing the following activities: 12/10/2016 12/11/2015  Hearing? N N  Vision? N N  Difficulty concentrating or making decisions? N N  Walking or climbing stairs? N N  Dressing or bathing? N N  Doing errands, shopping? N N  Preparing Food and eating ? N N  Using the Toilet? N N  In the past six months, have you accidently leaked urine? N N  Do you have problems with loss of bowel control? N N  Managing your Medications? N N  Managing your Finances? N N  Housekeeping or managing your Housekeeping? N N  Some recent data might be hidden    Patient Care Team: [name], MD as PCP - General (Internal Medicine)    Assessment:    This is a routine wellness examination for [place]. The goal of the wellness visit is to assist the patient how to close the gaps in care and create a preventative care plan for the patient.   Taking calcium VIT D as appropriate/Osteoporosis risk reviewed.  Medications reviewed; taking without issues or barriers.  Safety issues  reviewed; smoke detectors in the home. No firearms in the home. Wears seatbelts when driving or riding with others. Patient does wear sunscreen or protective clothing when in direct sunlight. No violence in the home.  Depression- PHQ 2 &9 complete.  No signs/symptoms or verbal communication regarding little pleasure in doing things, feeling down, depressed or hopeless. No changes in sleeping, energy, eating, concentrating.  No thoughts of self harm or harm towards others.  Time spent on this topic is 8 minutes.   Patient is alert, normal appearance, oriented to person/place/and time. Correctly identified the president of the [place], recall of 2/3 words, and performing simple calculations.  Patient displays appropriate  judgement and can read correct time from watch face.  No new identified risk were noted.  No failures at ADL's or IADL's.   BMI- discussed the importance of a healthy diet, water intake and exercise. Educational material provided.   HTN- followed by PCP.  Dental- every six months.  Dr. Leanor Kail.  Eye- Visual acuity not assessed per patient preference since they have regular follow up with the ophthalmologist.  Wears corrective lenses.  Sleep patterns- Sleeps 8 hours at night.  Wakes feeling rested.  PNA 23 vaccine deferred per patient preference.  Educational material provided.  Patient Concerns: Scheduled follow up, discuss smoking cessation with PCP at that time.  Exercise Activities and Dietary recommendations Current Exercise Habits: Structured exercise class, Time (Minutes): 60, Frequency (Times/Week): 5, Weekly Exercise (Minutes/Week): 300, Intensity: Moderate  Goals    . Healthy Lifestyle          Stay hydrated and drink plenty of fluids. Low carb foods.  Lean meats and vegetables. Stay active and continue to exercise.    . Quit smoking / using tobacco          Educational material provided      Fall Risk Fall Risk  12/10/2016 12/11/2015 02/05/2015 05/02/2014  Falls in the past year? No No No No   Depression Screen PHQ 2/9 Scores 12/10/2016 12/11/2015 02/05/2015 05/02/2014  PHQ - 2 Score 0 0 0 2  PHQ- 9 Score 0 - - 5     Cognitive Function MMSE - Mini Mental State Exam 12/10/2016 12/11/2015  Orientation to time 5 5  Orientation to Place 5 5  Registration 3 3  Attention/ Calculation 5 5  Recall 3 3  Language- name 2 objects 2 2  Language- repeat 1 1  Language- follow 3 step command 3 3  Language- read & follow direction 1 1  Write a sentence 1 1  Copy design 1 1  Total score 30 30        Immunization History  Administered Date(s) Administered  . Influenza,inj,Quad PF,36+ Mos 05/24/2013, 04/30/2014  . PPD Test 09/18/2014  . Pneumococcal Conjugate-13  05/28/2014  . Pneumococcal Polysaccharide-23 05/29/2010  . Tdap 05/28/2010   Screening Tests Health Maintenance  Topic Date Due  . COLONOSCOPY  11/10/1996  . PNA vac Low Risk Adult (2 of 2 - PPSV23) 05/30/2015  . INFLUENZA VACCINE  03/10/2017  . MAMMOGRAM  01/02/2018  . TETANUS/TDAP  05/28/2020  . DEXA SCAN  Completed  . Hepatitis C Screening  Completed      Plan:    End of life planning; Advance aging; Advanced directives discussed. Copy of current HCPOA/Living Will requested.    I have personally reviewed and noted the following in the patient's chart:   . Medical and social history . Use of alcohol, tobacco or illicit drugs  .  Current medications and supplements . Functional ability and status . Nutritional status . Physical activity . Advanced directives . List of other physicians . Hospitalizations, surgeries, and ER visits in previous 12 months . Vitals . Screenings to include cognitive, depression, and falls . Referrals and appointments  In addition, I have reviewed and discussed with patient certain preventive protocols, quality metrics, and best practice recommendations. A written personalized care plan for preventive services as well as general preventive health recommendations were provided to patient.     Varney Biles, LPN  12/16/8323

## 2016-12-14 NOTE — Progress Notes (Signed)
  I have reviewed the above information and agree with above.   Wyndham Santilli, MD 

## 2016-12-17 NOTE — Progress Notes (Signed)
  I have reviewed the above information and agree with above.   Mayia Megill, MD 

## 2017-02-02 ENCOUNTER — Ambulatory Visit (INDEPENDENT_AMBULATORY_CARE_PROVIDER_SITE_OTHER): Payer: Medicare Other | Admitting: Internal Medicine

## 2017-02-02 ENCOUNTER — Encounter: Payer: Self-pay | Admitting: Internal Medicine

## 2017-02-02 VITALS — BP 146/80 | HR 63 | Temp 97.9°F | Resp 15 | Ht 66.0 in | Wt 191.2 lb

## 2017-02-02 DIAGNOSIS — Z1231 Encounter for screening mammogram for malignant neoplasm of breast: Secondary | ICD-10-CM

## 2017-02-02 DIAGNOSIS — I10 Essential (primary) hypertension: Secondary | ICD-10-CM | POA: Diagnosis not present

## 2017-02-02 DIAGNOSIS — Z716 Tobacco abuse counseling: Secondary | ICD-10-CM

## 2017-02-02 DIAGNOSIS — E034 Atrophy of thyroid (acquired): Secondary | ICD-10-CM

## 2017-02-02 DIAGNOSIS — R7303 Prediabetes: Secondary | ICD-10-CM

## 2017-02-02 DIAGNOSIS — E6609 Other obesity due to excess calories: Secondary | ICD-10-CM

## 2017-02-02 DIAGNOSIS — E782 Mixed hyperlipidemia: Secondary | ICD-10-CM | POA: Diagnosis not present

## 2017-02-02 DIAGNOSIS — Z1211 Encounter for screening for malignant neoplasm of colon: Secondary | ICD-10-CM

## 2017-02-02 DIAGNOSIS — R5383 Other fatigue: Secondary | ICD-10-CM

## 2017-02-02 DIAGNOSIS — Z6830 Body mass index (BMI) 30.0-30.9, adult: Secondary | ICD-10-CM

## 2017-02-02 DIAGNOSIS — Z1239 Encounter for other screening for malignant neoplasm of breast: Secondary | ICD-10-CM

## 2017-02-02 LAB — CBC WITH DIFFERENTIAL/PLATELET
BASOS PCT: 1.3 % (ref 0.0–3.0)
Basophils Absolute: 0.1 10*3/uL (ref 0.0–0.1)
EOS PCT: 2.2 % (ref 0.0–5.0)
Eosinophils Absolute: 0.1 10*3/uL (ref 0.0–0.7)
HCT: 45.1 % (ref 36.0–46.0)
HEMOGLOBIN: 15.8 g/dL — AB (ref 12.0–15.0)
LYMPHS ABS: 1.1 10*3/uL (ref 0.7–4.0)
Lymphocytes Relative: 18.4 % (ref 12.0–46.0)
MCHC: 35.1 g/dL (ref 30.0–36.0)
MCV: 91.9 fl (ref 78.0–100.0)
MONOS PCT: 7.8 % (ref 3.0–12.0)
Monocytes Absolute: 0.5 10*3/uL (ref 0.1–1.0)
Neutro Abs: 4.3 10*3/uL (ref 1.4–7.7)
Neutrophils Relative %: 70.3 % (ref 43.0–77.0)
Platelets: 197 10*3/uL (ref 150.0–400.0)
RBC: 4.91 Mil/uL (ref 3.87–5.11)
RDW: 13.5 % (ref 11.5–15.5)
WBC: 6.1 10*3/uL (ref 4.0–10.5)

## 2017-02-02 LAB — COMPREHENSIVE METABOLIC PANEL
ALBUMIN: 4.6 g/dL (ref 3.5–5.2)
ALT: 17 U/L (ref 0–35)
AST: 22 U/L (ref 0–37)
Alkaline Phosphatase: 91 U/L (ref 39–117)
BUN: 14 mg/dL (ref 6–23)
CHLORIDE: 102 meq/L (ref 96–112)
CO2: 29 mEq/L (ref 19–32)
Calcium: 9.9 mg/dL (ref 8.4–10.5)
Creatinine, Ser: 0.85 mg/dL (ref 0.40–1.20)
GFR: 70.23 mL/min (ref >60.00)
Glucose, Bld: 103 mg/dL — ABNORMAL HIGH (ref 70–99)
POTASSIUM: 3.9 meq/L (ref 3.5–5.1)
SODIUM: 140 meq/L (ref 135–145)
Total Bilirubin: 0.7 mg/dL (ref 0.2–1.2)
Total Protein: 6.7 g/dL (ref 6.0–8.3)

## 2017-02-02 LAB — MICROALBUMIN / CREATININE URINE RATIO
Creatinine,U: 31 mg/dL
Microalb Creat Ratio: 2.3 mg/g (ref 0.0–30.0)
Microalb, Ur: <0.7 mg/dL (ref 0.0–1.9)

## 2017-02-02 LAB — TSH: TSH: 2.06 u[IU]/mL (ref 0.35–4.50)

## 2017-02-02 LAB — LDL CHOLESTEROL, DIRECT: LDL DIRECT: 141 mg/dL

## 2017-02-02 LAB — HEMOGLOBIN A1C: HEMOGLOBIN A1C: 5.9 % (ref 4.6–6.5)

## 2017-02-02 LAB — LIPID PANEL
Cholesterol: 220 mg/dL — ABNORMAL HIGH (ref 0–200)
HDL: 46.3 mg/dL (ref >39.00)
NonHDL: 174.15
TRIGLYCERIDES: 211 mg/dL — AB (ref 0.0–149.0)
Total CHOL/HDL Ratio: 5
VLDL: 42.2 mg/dL — ABNORMAL HIGH (ref 0.0–40.0)

## 2017-02-02 MED ORDER — AMLODIPINE BESYLATE 2.5 MG PO TABS: 2.5000 mg | ORAL_TABLET | Freq: Every day | ORAL | 3 refills | Status: DC

## 2017-02-02 NOTE — Patient Instructions (Signed)
I am adding amlodipine to your losartan /hct regimen for blood pressure control.  Goal is < 130/80  .    Mammogram ordered  Please reconsider getting colon ca screening   Health Maintenance for Postmenopausal Women Menopause is a normal process in which your reproductive ability comes to an end. This process happens gradually over a span of months to years, usually between the ages of 66 and 74. Menopause is complete when you have missed 12 consecutive menstrual periods. It is important to talk with your health care provider about some of the most common conditions that affect postmenopausal women, such as heart disease, cancer, and bone loss (osteoporosis). Adopting a healthy lifestyle and getting preventive care can help to promote your health and wellness. Those actions can also lower your chances of developing some of these common conditions. What should I know about menopause? During menopause, you may experience a number of symptoms, such as:  Moderate-to-severe hot flashes.  Night sweats.  Decrease in sex drive.  Mood swings.  Headaches.  Tiredness.  Irritability.  Memory problems.  Insomnia.  Choosing to treat or not to treat menopausal changes is an individual decision that you make with your health care provider. What should I know about hormone replacement therapy and supplements? Hormone therapy products are effective for treating symptoms that are associated with menopause, such as hot flashes and night sweats. Hormone replacement carries certain risks, especially as you become older. If you are thinking about using estrogen or estrogen with progestin treatments, discuss the benefits and risks with your health care provider. What should I know about heart disease and stroke? Heart disease, heart attack, and stroke become more likely as you age. This may be due, in part, to the hormonal changes that your body experiences during menopause. These can affect how your body  processes dietary fats, triglycerides, and cholesterol. Heart attack and stroke are both medical emergencies. There are many things that you can do to help prevent heart disease and stroke:  Have your blood pressure checked at least every 1-2 years. High blood pressure causes heart disease and increases the risk of stroke.  If you are 1-1 years old, ask your health care provider if you should take aspirin to prevent a heart attack or a stroke.  Do not use any tobacco products, including cigarettes, chewing tobacco, or electronic cigarettes. If you need help quitting, ask your health care provider.  It is important to eat a healthy diet and maintain a healthy weight. ? Be sure to include plenty of vegetables, fruits, low-fat dairy products, and lean protein. ? Avoid eating foods that are high in solid fats, added sugars, or salt (sodium).  Get regular exercise. This is one of the most important things that you can do for your health. ? Try to exercise for at least 150 minutes each week. The type of exercise that you do should increase your heart rate and make you sweat. This is known as moderate-intensity exercise. ? Try to do strengthening exercises at least twice each week. Do these in addition to the moderate-intensity exercise.  Know your numbers.Ask your health care provider to check your cholesterol and your blood glucose. Continue to have your blood tested as directed by your health care provider.  What should I know about cancer screening? There are several types of cancer. Take the following steps to reduce your risk and to catch any cancer development as early as possible. Breast Cancer  Practice breast self-awareness. ? This means  understanding how your breasts normally appear and feel. ? It also means doing regular breast self-exams. Let your health care provider know about any changes, no matter how small.  If you are 17 or older, have a clinician do a breast exam (clinical  breast exam or CBE) every year. Depending on your age, family history, and medical history, it may be recommended that you also have a yearly breast X-ray (mammogram).  If you have a family history of breast cancer, talk with your health care provider about genetic screening.  If you are at high risk for breast cancer, talk with your health care provider about having an MRI and a mammogram every year.  Breast cancer (BRCA) gene test is recommended for women who have family members with BRCA-related cancers. Results of the assessment will determine the need for genetic counseling and BRCA1 and for BRCA2 testing. BRCA-related cancers include these types: ? Breast. This occurs in males or females. ? Ovarian. ? Tubal. This may also be called fallopian tube cancer. ? Cancer of the abdominal or pelvic lining (peritoneal cancer). ? Prostate. ? Pancreatic.  Cervical, Uterine, and Ovarian Cancer Your health care provider may recommend that you be screened regularly for cancer of the pelvic organs. These include your ovaries, uterus, and vagina. This screening involves a pelvic exam, which includes checking for microscopic changes to the surface of your cervix (Pap test).  For women ages 21-65, health care providers may recommend a pelvic exam and a Pap test every three years. For women ages 69-65, they may recommend the Pap test and pelvic exam, combined with testing for human papilloma virus (HPV), every five years. Some types of HPV increase your risk of cervical cancer. Testing for HPV may also be done on women of any age who have unclear Pap test results.  Other health care providers may not recommend any screening for nonpregnant women who are considered low risk for pelvic cancer and have no symptoms. Ask your health care provider if a screening pelvic exam is right for you.  If you have had past treatment for cervical cancer or a condition that could lead to cancer, you need Pap tests and  screening for cancer for at least 20 years after your treatment. If Pap tests have been discontinued for you, your risk factors (such as having a new sexual partner) need to be reassessed to determine if you should start having screenings again. Some women have medical problems that increase the chance of getting cervical cancer. In these cases, your health care provider may recommend that you have screening and Pap tests more often.  If you have a family history of uterine cancer or ovarian cancer, talk with your health care provider about genetic screening.  If you have vaginal bleeding after reaching menopause, tell your health care provider.  There are currently no reliable tests available to screen for ovarian cancer.  Lung Cancer Lung cancer screening is recommended for adults 34-15 years old who are at high risk for lung cancer because of a history of smoking. A yearly low-dose CT scan of the lungs is recommended if you:  Currently smoke.  Have a history of at least 30 pack-years of smoking and you currently smoke or have quit within the past 15 years. A pack-year is smoking an average of one pack of cigarettes per day for one year.  Yearly screening should:  Continue until it has been 15 years since you quit.  Stop if you develop a health  problem that would prevent you from having lung cancer treatment.  Colorectal Cancer  This type of cancer can be detected and can often be prevented.  Routine colorectal cancer screening usually begins at age 25 and continues through age 69.  If you have risk factors for colon cancer, your health care provider may recommend that you be screened at an earlier age.  If you have a family history of colorectal cancer, talk with your health care provider about genetic screening.  Your health care provider may also recommend using home test kits to check for hidden blood in your stool.  A small camera at the end of a tube can be used to examine  your colon directly (sigmoidoscopy or colonoscopy). This is done to check for the earliest forms of colorectal cancer.  Direct examination of the colon should be repeated every 5-10 years until age 53. However, if early forms of precancerous polyps or small growths are found or if you have a family history or genetic risk for colorectal cancer, you may need to be screened more often.  Skin Cancer  Check your skin from head to toe regularly.  Monitor any moles. Be sure to tell your health care provider: ? About any new moles or changes in moles, especially if there is a change in a mole's shape or color. ? If you have a mole that is larger than the size of a pencil eraser.  If any of your family members has a history of skin cancer, especially at a young age, talk with your health care provider about genetic screening.  Always use sunscreen. Apply sunscreen liberally and repeatedly throughout the day.  Whenever you are outside, protect yourself by wearing long sleeves, pants, a wide-brimmed hat, and sunglasses.  What should I know about osteoporosis? Osteoporosis is a condition in which bone destruction happens more quickly than new bone creation. After menopause, you may be at an increased risk for osteoporosis. To help prevent osteoporosis or the bone fractures that can happen because of osteoporosis, the following is recommended:  If you are 65-85 years old, get at least 1,000 mg of calcium and at least 600 mg of vitamin D per day.  If you are older than age 54 but younger than age 90, get at least 1,200 mg of calcium and at least 600 mg of vitamin D per day.  If you are older than age 53, get at least 1,200 mg of calcium and at least 800 mg of vitamin D per day.  Smoking and excessive alcohol intake increase the risk of osteoporosis. Eat foods that are rich in calcium and vitamin D, and do weight-bearing exercises several times each week as directed by your health care provider. What  should I know about how menopause affects my mental health? Depression may occur at any age, but it is more common as you become older. Common symptoms of depression include:  Low or sad mood.  Changes in sleep patterns.  Changes in appetite or eating patterns.  Feeling an overall lack of motivation or enjoyment of activities that you previously enjoyed.  Frequent crying spells.  Talk with your health care provider if you think that you are experiencing depression. What should I know about immunizations? It is important that you get and maintain your immunizations. These include:  Tetanus, diphtheria, and pertussis (Tdap) booster vaccine.  Influenza every year before the flu season begins.  Pneumonia vaccine.  Shingles vaccine.  Your health care provider may also recommend  other immunizations. This information is not intended to replace advice given to you by your health care provider. Make sure you discuss any questions you have with your health care provider. Document Released: 09/18/2005 Document Revised: 02/14/2016 Document Reviewed: 04/30/2015 Elsevier Interactive Patient Education  2018 [employer].

## 2017-02-02 NOTE — Progress Notes (Signed)
Subjective:  Patient ID: Alexandra Copeland, female    DOB: 02/26/1946  Age: 71 y.o. MRN: [id number]  CC: The primary encounter diagnosis was Essential hypertension. Diagnoses of Mixed hyperlipidemia, Prediabetes, Hypothyroidism due to acquired atrophy of thyroid, Breast cancer screening, Fatigue, unspecified type, Tobacco abuse counseling, Class 1 obesity due to excess calories with serious comorbidity and body mass index (BMI) of 30.0 to 30.9 in adult, and Colon cancer screening were also pertinent to this visit.  HPI Alexandra Copeland presents for anuual follow up on prediabetes, hyperlipidemia and hypertension   Exercising regularly since retirement.  Spending time every day outside gardening.  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  Cutting back gradually   Outpatient Medications Prior to Visit  Medication Sig Dispense Refill  . aspirin 325 MG tablet Take 325 mg by mouth daily.    Marland Kitchen atorvastatin (LIPITOR) 80 MG tablet TAKE ONE TABLET BY MOUTH ONCE DAILY 90 tablet 1  . cholecalciferol (VITAMIN D) 1000 UNITS tablet Take 1,000 Units by mouth daily.    Marland Kitchen levothyroxine (SYNTHROID, LEVOTHROID) 75 MCG tablet Take 1 tablet (75 mcg total) by mouth daily before breakfast. 90 tablet 0  . losartan-hydrochlorothiazide (HYZAAR) 100-25 MG tablet TAKE ONE TABLET BY MOUTH ONCE DAILY 90 tablet 2  . Multiple Vitamin (MULTIVITAMIN) tablet Take 1 tablet by mouth daily.    . ranitidine (ZANTAC) 75 MG tablet Take 75 mg by mouth daily.      No facility-administered medications prior to visit.     Review of Systems;  Patient denies headache, fevers, malaise, unintentional weight loss, skin rash, eye pain, sinus congestion and sinus pain, sore throat, dysphagia,  hemoptysis , cough, dyspnea, wheezing, chest pain, palpitations, orthopnea, edema, abdominal pain, nausea, melena, diarrhea, constipation, flank pain, dysuria,  hematuria, urinary  Frequency, nocturia, numbness, tingling, seizures,  Focal weakness, Loss of consciousness,  Tremor, insomnia, depression, anxiety, and suicidal ideation.      Objective:  BP (!) 146/80 (BP Location: Left Arm, Patient Position: Sitting, Cuff Size: Normal)   Pulse 63   Temp 97.9 F (36.6 C) (Oral)   Resp 15   Ht 5\' 6"  (1.676 m)   Wt 191 lb 3.2 oz (86.7 kg)   SpO2 97%   BMI 30.86 kg/m   BP Readings from Last 3 Encounters:  02/02/17 (!) 146/80  12/10/16 140/78  01/09/16 140/78    Wt Readings from Last 3 Encounters:  02/02/17 191 lb 3.2 oz (86.7 kg)  12/10/16 193 lb 6.4 oz (87.7 kg)  01/09/16 189 lb 12.8 oz (86.1 kg)    General appearance: alert, cooperative and appears stated age Ears: normal TM's and external ear canals both ears Throat: lips, mucosa, and tongue normal; teeth and gums normal Neck: no adenopathy, no carotid bruit, supple, symmetrical, trachea midline and thyroid not enlarged, symmetric, no tenderness/mass/nodules Back: symmetric, no curvature. ROM normal. No CVA tenderness. Lungs: clear to auscultation bilaterally Heart: regular rate and rhythm, S1, S2 normal, no murmur, click, rub or gallop Abdomen: soft, non-tender; bowel sounds normal; no masses,  no organomegaly Pulses: 2+ and symmetric Skin: Skin color, texture, turgor normal. No rashes or lesions Lymph nodes: Cervical, supraclavicular, and axillary nodes normal.  Lab Results  Component Value Date   HGBA1C 5.9 02/02/2017   HGBA1C 5.7 01/09/2016    Lab Results  Component Value Date   CREATININE 0.85 02/02/2017   CREATININE 0.90 01/02/2016  CREATININE 0.91 07/22/2015    Lab Results  Component Value Date   WBC 6.1 02/02/2017   HGB 15.8 (H) 02/02/2017   HCT 45.1 02/02/2017   PLT 197.0 02/02/2017   GLUCOSE 103 (H) 02/02/2017   CHOL 220 (H) 02/02/2017   TRIG 211.0 (H) 02/02/2017   HDL 46.30 02/02/2017   LDLDIRECT 141.0 02/02/2017   LDLCALC 98 01/02/2016   ALT 17  02/02/2017   AST 22 02/02/2017   NA 140 02/02/2017   K 3.9 02/02/2017   CL 102 02/02/2017   CREATININE 0.85 02/02/2017   BUN 14 02/02/2017   CO2 29 02/02/2017   TSH 2.06 02/02/2017   HGBA1C 5.9 02/02/2017   MICROALBUR <0.7 02/02/2017    Dg Bone Density  Result Date: 03/26/2016 EXAM: DUAL X-RAY ABSORPTIOMETRY (DXA) FOR BONE MINERAL DENSITY IMPRESSION: Dear Dr. Derrel Nip, Your patient Alexandra Copeland completed a BMD test on 03/26/2016 using the [employer] (analysis version: 14.10) manufactured by [employer]. The following summarizes the results of our evaluation. PATIENT BIOGRAPHICAL: Name: Alexandra Copeland, Alexandra Copeland Patient ID: [id number] Birth Date: 12-Oct-1945 Height: 65.5 in. Gender: Female Exam Date: 03/26/2016 Weight: 191.0 lbs. Indications: Caucasian, Height Loss, Hypothyroid, Postmenopausal, Tobacco User(Current Smoker) Fractures: Treatments: aspirin, lipitor, Multi-Vitamin with calcium, Synthroid, Vit D, zantac ASSESSMENT: The BMD measured at Femur Neck Right is 0.962 g/cm2 with a T-score of -0.5. This patient is considered normal according to [facility]) criteria. Site Region Measured Measured WHO Young Adult BMD Date       Age      Classification T-score AP Spine L1-L4 4/26/2016 69.3 Normal 0.1 1.211 g/cm2 AP Spine L1-L4 08/08/2008 61.7 Normal 0.2 1.216 g/cm2 DualFemur Neck Right 03/26/2016 69.3 Normal -0.5 0.962 g/cm2 DualFemur Neck Right 08/08/2008 61.7 Normal -0.1 1.026 g/cm2 World Health Organization [facility]) criteria for post-menopausal, Caucasian Women: Normal:       T-score at or above -1 SD Osteopenia:   T-score between -1 and -2.5 SD Osteoporosis: T-score at or below -2.5 SD RECOMMENDATIONS: [place] recommends that FDA-approved medical therapies be considered in postmenopausal women and men age 48 or older with a: 1. Hip or vertebral (clinical or morphometric) fracture. 2. T-score of < -2.5 at the spine or hip. 3. Ten-year fracture  probability by FRAX of 3% or greater for hip fracture or 20% or greater for major osteoporotic fracture. All treatment decisions require clinical judgment and consideration of individual patient factors, including patient preferences, co-morbidities, previous drug use, risk factors not captured in the FRAX model (e.g. falls, vitamin D deficiency, increased bone turnover, interval significant decline in bone density) and possible under - or over-estimation of fracture risk by FRAX. All patients should ensure an adequate intake of dietary calcium (1200 mg/d) and vitamin D (800 IU daily) unless contraindicated. FOLLOW-UP: People with diagnosed cases of osteoporosis or at high risk for fracture should have regular bone mineral density tests. For patients eligible for Medicare, routine testing is allowed once every 2 years. The testing frequency can be increased to one year for patients who have rapidly progressing disease, those who are receiving or discontinuing medical therapy to restore bone mass, or have additional risk factors. I have reviewed this report, and agree with the above findings. [facility]iology Electronically Signed   By: Claudie Revering M.D.   On: 03/26/2016 13:40    Assessment & Plan:   Problem List Items Addressed This Visit    Tobacco abuse counseling    The patient was counseled on the  dangers of tobacco use, and was advised to quit.  Reviewed strategies to maximize success, including removing cigarettes and smoking materials from environment.      Prediabetes    Fasting glucose was 110 ,  checking a1c today and it is 5.9 .  Encouraged to continue daily exercise and follow low GI diet. .  There is a strong  FH of type 2 DM.  Lab Results  Component Value Date   HGBA1C 5.9 02/02/2017         Relevant Orders   Hemoglobin A1c (Completed)   Obesity    I have congratulated her in her lifestyle changes and encouraged  Continued weight loss with goal of 10% of body weigh over the  next 6 months using a low glycemic index diet and regular exercise a minimum of 5 days per week.        Hypothyroidism   Relevant Orders   TSH (Completed)   Hypertension - Primary    Elevated today.  Reviewed list of meds, patient is not taking OTC meds that could be causing it. Adding amlodipine 2.5 mg to current regimen.   Lab Results  Component Value Date   CREATININE 0.85 02/02/2017   Lab Results  Component Value Date   NA 140 02/02/2017   K 3.9 02/02/2017   CL 102 02/02/2017   CO2 29 02/02/2017         Relevant Medications   amLODipine (NORVASC) 2.5 MG tablet   Other Relevant Orders   Comprehensive metabolic panel (Completed)   Microalbumin / creatinine urine ratio (Completed)   Hyperlipidemia    Tolerating potent dose statin therapy.   Liver enzymes are normal , no changes today. Elevated trigs addressed with diet and exercise.   Lab Results  Component Value Date   ALT 17 02/02/2017   AST 22 02/02/2017   ALKPHOS 91 02/02/2017   BILITOT 0.7 02/02/2017   Lab Results  Component Value Date   CHOL 220 (H) 02/02/2017   HDL 46.30 02/02/2017   LDLCALC 98 01/02/2016   LDLDIRECT 141.0 02/02/2017   TRIG 211.0 (H) 02/02/2017   CHOLHDL 5 02/02/2017             Relevant Medications   amLODipine (NORVASC) 2.5 MG tablet   Other Relevant Orders   LDL cholesterol, direct (Completed)   Lipid panel (Completed)   Colon cancer screening    She continues to defer colon cancer screenign m including cologuard        Other Visit Diagnoses    Breast cancer screening       Relevant Orders   MM SCREENING BREAST TOMO BILATERAL   Fatigue, unspecified type       Relevant Orders   CBC with Differential/Platelet (Completed)      I am having Alexandra Copeland start on amLODipine. I am also having her maintain her cholecalciferol, aspirin, multivitamin, ranitidine, atorvastatin, levothyroxine, and losartan-hydrochlorothiazide.  Meds ordered this encounter  Medications  .  amLODipine (NORVASC) 2.5 MG tablet    Sig: Take 1 tablet (2.5 mg total) by mouth daily.    Dispense:  90 tablet    Refill:  3    There are no discontinued medications.  Follow-up: No Follow-up on file.   [name], MD

## 2017-02-03 DIAGNOSIS — Z1211 Encounter for screening for malignant neoplasm of colon: Secondary | ICD-10-CM | POA: Insufficient documentation

## 2017-02-03 NOTE — Assessment & Plan Note (Signed)
Fasting glucose was 110 ,  checking a1c today and it is 5.9 .  Encouraged to continue daily exercise and follow low GI diet. .  There is a strong  FH of type 2 DM.  Lab Results  Component Value Date   HGBA1C 5.9 02/02/2017

## 2017-02-03 NOTE — Assessment & Plan Note (Addendum)
She continues to defer colon cancer screenign m including cologuard

## 2017-02-03 NOTE — Assessment & Plan Note (Signed)
Elevated today.  Reviewed list of meds, patient is not taking OTC meds that could be causing it. Adding amlodipine 2.5 mg to current regimen.   Lab Results  Component Value Date   CREATININE 0.85 02/02/2017   Lab Results  Component Value Date   NA 140 02/02/2017   K 3.9 02/02/2017   CL 102 02/02/2017   CO2 29 02/02/2017

## 2017-02-03 NOTE — Assessment & Plan Note (Signed)
I have congratulated her in her lifestyle changes and encouraged  Continued weight loss with goal of 10% of body weigh over the next 6 months using a low glycemic index diet and regular exercise a minimum of 5 days per week.

## 2017-02-03 NOTE — Assessment & Plan Note (Signed)
The patient was counseled on the dangers of tobacco use, and was advised to quit.  Reviewed strategies to maximize success, including removing cigarettes and smoking materials from environment. 

## 2017-02-03 NOTE — Assessment & Plan Note (Signed)
Tolerating potent dose statin therapy.   Liver enzymes are normal , no changes today. Elevated trigs addressed with diet and exercise.   Lab Results  Component Value Date   ALT 17 02/02/2017   AST 22 02/02/2017   ALKPHOS 91 02/02/2017   BILITOT 0.7 02/02/2017   Lab Results  Component Value Date   CHOL 220 (H) 02/02/2017   HDL 46.30 02/02/2017   LDLCALC 98 01/02/2016   LDLDIRECT 141.0 02/02/2017   TRIG 211.0 (H) 02/02/2017   CHOLHDL 5 02/02/2017

## 2017-02-04 ENCOUNTER — Encounter: Payer: Self-pay | Admitting: Internal Medicine

## 2017-02-04 MED ORDER — LEVOTHYROXINE SODIUM 75 MCG PO TABS: 75.0000 ug | ORAL_TABLET | Freq: Every day | ORAL | 0 refills | Status: DC

## 2017-02-04 NOTE — Telephone Encounter (Signed)
Sent in Rx, please see her answers to your questions. thanks

## 2017-03-31 ENCOUNTER — Other Ambulatory Visit: Payer: Self-pay | Admitting: Internal Medicine

## 2017-05-04 ENCOUNTER — Other Ambulatory Visit: Payer: Self-pay | Admitting: Internal Medicine

## 2017-08-05 ENCOUNTER — Encounter: Payer: Self-pay | Admitting: Internal Medicine

## 2017-08-05 ENCOUNTER — Ambulatory Visit (INDEPENDENT_AMBULATORY_CARE_PROVIDER_SITE_OTHER): Payer: Medicare Other | Admitting: Internal Medicine

## 2017-08-05 VITALS — BP 132/84 | HR 70 | Temp 97.8°F | Resp 15 | Ht 66.0 in | Wt 196.6 lb

## 2017-08-05 DIAGNOSIS — Z716 Tobacco abuse counseling: Secondary | ICD-10-CM

## 2017-08-05 DIAGNOSIS — I10 Essential (primary) hypertension: Secondary | ICD-10-CM | POA: Diagnosis not present

## 2017-08-05 DIAGNOSIS — E785 Hyperlipidemia, unspecified: Secondary | ICD-10-CM | POA: Diagnosis not present

## 2017-08-05 DIAGNOSIS — F17210 Nicotine dependence, cigarettes, uncomplicated: Secondary | ICD-10-CM

## 2017-08-05 DIAGNOSIS — R7303 Prediabetes: Secondary | ICD-10-CM | POA: Diagnosis not present

## 2017-08-05 DIAGNOSIS — D582 Other hemoglobinopathies: Secondary | ICD-10-CM

## 2017-08-05 DIAGNOSIS — Z1231 Encounter for screening mammogram for malignant neoplasm of breast: Secondary | ICD-10-CM

## 2017-08-05 DIAGNOSIS — E034 Atrophy of thyroid (acquired): Secondary | ICD-10-CM | POA: Diagnosis not present

## 2017-08-05 DIAGNOSIS — Z6831 Body mass index (BMI) 31.0-31.9, adult: Secondary | ICD-10-CM | POA: Diagnosis not present

## 2017-08-05 DIAGNOSIS — E6609 Other obesity due to excess calories: Secondary | ICD-10-CM | POA: Diagnosis not present

## 2017-08-05 DIAGNOSIS — E782 Mixed hyperlipidemia: Secondary | ICD-10-CM | POA: Diagnosis not present

## 2017-08-05 DIAGNOSIS — R7301 Impaired fasting glucose: Secondary | ICD-10-CM | POA: Diagnosis not present

## 2017-08-05 DIAGNOSIS — Z1239 Encounter for other screening for malignant neoplasm of breast: Secondary | ICD-10-CM

## 2017-08-05 LAB — CBC WITH DIFFERENTIAL/PLATELET
BASOS ABS: 0.1 10*3/uL (ref 0.0–0.1)
BASOS PCT: 1.1 % (ref 0.0–3.0)
EOS ABS: 0.1 10*3/uL (ref 0.0–0.7)
Eosinophils Relative: 2.1 % (ref 0.0–5.0)
HCT: 45.9 % (ref 36.0–46.0)
Hemoglobin: 15.5 g/dL — ABNORMAL HIGH (ref 12.0–15.0)
LYMPHS ABS: 1.2 10*3/uL (ref 0.7–4.0)
LYMPHS PCT: 20 % (ref 12.0–46.0)
MCHC: 33.8 g/dL (ref 30.0–36.0)
MCV: 93.5 fl (ref 78.0–100.0)
MONO ABS: 0.6 10*3/uL (ref 0.1–1.0)
Monocytes Relative: 9.3 % (ref 3.0–12.0)
NEUTROS ABS: 4.1 10*3/uL (ref 1.4–7.7)
NEUTROS PCT: 67.5 % (ref 43.0–77.0)
PLATELETS: 204 10*3/uL (ref 150.0–400.0)
RBC: 4.9 Mil/uL (ref 3.87–5.11)
RDW: 13.1 % (ref 11.5–15.5)
WBC: 6 10*3/uL (ref 4.0–10.5)

## 2017-08-05 LAB — COMPREHENSIVE METABOLIC PANEL
ALT: 22 U/L (ref 0–35)
AST: 25 U/L (ref 0–37)
Albumin: 4.3 g/dL (ref 3.5–5.2)
Alkaline Phosphatase: 92 U/L (ref 39–117)
BUN: 16 mg/dL (ref 6–23)
CALCIUM: 9.7 mg/dL (ref 8.4–10.5)
CHLORIDE: 104 meq/L (ref 96–112)
CO2: 27 meq/L (ref 19–32)
CREATININE: 0.88 mg/dL (ref 0.40–1.20)
GFR: 67.38 mL/min (ref >60.00)
Glucose, Bld: 106 mg/dL — ABNORMAL HIGH (ref 70–99)
POTASSIUM: 3.9 meq/L (ref 3.5–5.1)
SODIUM: 139 meq/L (ref 135–145)
Total Bilirubin: 0.6 mg/dL (ref 0.2–1.2)
Total Protein: 6.9 g/dL (ref 6.0–8.3)

## 2017-08-05 LAB — HEMOGLOBIN A1C: Hgb A1c MFr Bld: 6 % (ref 4.6–6.5)

## 2017-08-05 LAB — LIPID PANEL
CHOL/HDL RATIO: 4
CHOLESTEROL: 179 mg/dL (ref 0–200)
HDL: 44.4 mg/dL (ref >39.00)
NonHDL: 134.3
Triglycerides: 215 mg/dL — ABNORMAL HIGH (ref 0.0–149.0)
VLDL: 43 mg/dL — AB (ref 0.0–40.0)

## 2017-08-05 LAB — LDL CHOLESTEROL, DIRECT: LDL DIRECT: 114 mg/dL

## 2017-08-05 LAB — TSH: TSH: 2.78 u[IU]/mL (ref 0.35–4.50)

## 2017-08-05 MED ORDER — VARENICLINE TARTRATE 1 MG PO TABS: 1.0000 mg | ORAL_TABLET | Freq: Two times a day (BID) | ORAL | 3 refills | Status: DC

## 2017-08-05 MED ORDER — VARENICLINE TARTRATE 0.5 MG X 11 & 1 MG X 42 PO MISC: ORAL | 0 refills | Status: DC

## 2017-08-05 NOTE — Progress Notes (Signed)
Subjective:  Patient ID: Alexandra Copeland, female    DOB: Jul 31, 1946  Age: 71 y.o. MRN: [id number]  CC: The primary encounter diagnosis was Impaired fasting glucose. Diagnoses of Hypothyroidism due to acquired atrophy of thyroid, Hyperlipidemia LDL goal <100, Elevated hemoglobin ([place]), Breast cancer screening, Class 1 obesity due to excess calories with serious comorbidity and body mass index (BMI) of 31.0 to 31.9 in adult, Tobacco abuse counseling, Essential hypertension, Prediabetes, and Mixed hyperlipidemia were also pertinent to this visit.  HPI TAKYIA SINDT presents for follow up on hypothyroid, obesity, prediabetes. tobacco abuse and Hypertension: patient checks blood pressure twice weekly at home.  Readings have been for the most part < 140/80 at rest . Patient is following a reduce salt diet most days and is taking medications as prescribed.  Overweight: trouble losing weight despite participating in water aerobics 3 times weekly.  Discussed increasing her partcipation in exercise to 5 /week and adjusting dose of T4 if TSH is > 2 today   Tobacco abuse:  still smoking 1 pack daily ,  Used chantix to quit once,  But did not use the maintenance packs due to cost and resume smoking.   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 amLODipine (NORVASC) 2.5 MG tablet Take 1 tablet (2.5 mg total) by mouth daily. 90 tablet 3  . aspirin 325 MG tablet Take 325 mg by mouth daily.    Marland Kitchen atorvastatin (LIPITOR) 80 MG tablet TAKE ONE TABLET BY MOUTH ONCE DAILY 90 tablet 1  . cholecalciferol (VITAMIN D) 1000 UNITS tablet Take 1,000 Units by mouth daily.    Marland Kitchen losartan-hydrochlorothiazide (HYZAAR) 100-25 MG tablet  TAKE ONE TABLET BY MOUTH ONCE DAILY 90 tablet 2  . Multiple Vitamin (MULTIVITAMIN) tablet Take 1 tablet by mouth daily.    . ranitidine (ZANTAC) 75 MG tablet Take 75 mg by mouth daily.     Marland Kitchen levothyroxine (SYNTHROID, LEVOTHROID) 75 MCG tablet TAKE 1 TABLET BY MOUTH ONCE DAILY BEFORE BREAKFAST *OFFICE  VISIT  FOR  FURTHER  REFILLS* 90 tablet 0   No facility-administered medications prior to visit.     Review of Systems;  Patient denies headache, fevers, malaise, unintentional weight loss, skin rash, eye pain, sinus congestion and sinus pain, sore throat, dysphagia,  hemoptysis , cough, dyspnea, wheezing, chest pain, palpitations, orthopnea, edema, abdominal pain, nausea, melena, diarrhea, constipation, flank pain, dysuria, hematuria, urinary  Frequency, nocturia, numbness, tingling, seizures,  Focal weakness, Loss of consciousness,  Tremor, insomnia, depression, anxiety, and suicidal ideation.      Objective:  BP 132/84 (BP Location: Left Arm, Patient Position: Sitting, Cuff Size: Normal)   Pulse 70   Temp 97.8 F (36.6 C) (Oral)   Resp 15   Ht 5\' 6"  (1.676 m)   Wt 196 lb 9.6 oz (89.2 kg)   SpO2 97%   BMI 31.73 kg/m   BP Readings from Last 3 Encounters:  08/05/17 132/84  02/02/17 (!) 146/80  12/10/16 140/78    Wt Readings from Last 3 Encounters:  08/05/17 196 lb 9.6 oz (89.2 kg)  02/02/17 191 lb 3.2 oz (86.7 kg)  12/10/16 193  lb 6.4 oz (87.7 kg)    General appearance: alert, cooperative and appears stated age Ears: normal TM's and external ear canals both ears Throat: lips, mucosa, and tongue normal; teeth and gums normal Neck: no adenopathy, no carotid bruit, supple, symmetrical, trachea midline and thyroid not enlarged, symmetric, no tenderness/mass/nodules Back: symmetric, no curvature. ROM normal. No CVA tenderness. Lungs: clear to auscultation bilaterally Heart: regular rate and rhythm, S1, S2 normal, no murmur, click, rub or gallop Abdomen: soft, non-tender; bowel  sounds normal; no masses,  no organomegaly Pulses: 2+ and symmetric Skin: Skin color, texture, turgor normal. No rashes or lesions Lymph nodes: Cervical, supraclavicular, and axillary nodes normal.  Lab Results  Component Value Date   HGBA1C 6.0 08/05/2017   HGBA1C 5.9 02/02/2017   HGBA1C 5.7 01/09/2016    Lab Results  Component Value Date   CREATININE 0.88 08/05/2017   CREATININE 0.85 02/02/2017   CREATININE 0.90 01/02/2016    Lab Results  Component Value Date   WBC 6.0 08/05/2017   HGB 15.5 (H) 08/05/2017   HCT 45.9 08/05/2017   PLT 204.0 08/05/2017   GLUCOSE 106 (H) 08/05/2017   CHOL 179 08/05/2017   TRIG 215.0 (H) 08/05/2017   HDL 44.40 08/05/2017   LDLDIRECT 114.0 08/05/2017   LDLCALC 98 01/02/2016   ALT 22 08/05/2017   AST 25 08/05/2017   NA 139 08/05/2017   K 3.9 08/05/2017   CL 104 08/05/2017   CREATININE 0.88 08/05/2017   BUN 16 08/05/2017   CO2 27 08/05/2017   TSH 2.78 08/05/2017   HGBA1C 6.0 08/05/2017   MICROALBUR <0.7 02/02/2017    Dg Bone Density  Result Date: 03/26/2016 EXAM: DUAL X-RAY ABSORPTIOMETRY (DXA) FOR BONE MINERAL DENSITY IMPRESSION: Dear Dr. Derrel Nip, Your patient Alexandra Copeland completed a BMD test on 03/26/2016 using the [place] (analysis version: 14.10) manufactured by [employer]. The following summarizes the results of our evaluation. PATIENT BIOGRAPHICAL: Name: Murlene, Revell Patient ID: [id number] Birth Date: September 10, 1945 Height: 65.5 in. Gender: Female Exam Date: 03/26/2016 Weight: 191.0 lbs. Indications: Caucasian, Height Loss, Hypothyroid, Postmenopausal, Tobacco User(Current Smoker) Fractures: Treatments: aspirin, lipitor, Multi-Vitamin with calcium, Synthroid, Vit D, zantac ASSESSMENT: The BMD measured at Femur Neck Right is 0.962 g/cm2 with a T-score of -0.5. This patient is considered normal according to [facility]) criteria. Site Region Measured Measured WHO Young Adult BMD Date       Age       Classification T-score AP Spine L1-L4 03/26/2016 69.3 Normal 0.1 1.211 g/cm2 AP Spine L1-L4 08/08/2008 61.7 Normal 0.2 1.216 g/cm2 DualFemur Neck Right 03/26/2016 69.3 Normal -0.5 0.962 g/cm2 DualFemur Neck Right 08/08/2008 61.7 Normal -0.1 1.026 g/cm2 World Health Organization [facility]) criteria for post-menopausal, Caucasian Women: Normal:       T-score at or above -1 SD Osteopenia:   T-score between -1 and -2.5 SD Osteoporosis: T-score at or below -2.5 SD RECOMMENDATIONS: [place] recommends that FDA-approved medical therapies be considered in postmenopausal women and men age 61 or older with a: 1. Hip or vertebral (clinical or morphometric) fracture. 2. T-score of < -2.5 at the spine or hip. 3. Ten-year fracture probability by FRAX of 3% or greater for hip fracture or 20% or greater for major osteoporotic fracture. All treatment decisions require clinical judgment and consideration of individual patient factors, including patient preferences, co-morbidities, previous drug use, risk factors not captured in the FRAX model (e.g. falls, vitamin D deficiency, increased bone turnover, interval significant decline in  bone density) and possible under - or over-estimation of fracture risk by FRAX. All patients should ensure an adequate intake of dietary calcium (1200 mg/d) and vitamin D (800 IU daily) unless contraindicated. FOLLOW-UP: People with diagnosed cases of osteoporosis or at high risk for fracture should have regular bone mineral density tests. For patients eligible for Medicare, routine testing is allowed once every 2 years. The testing frequency can be increased to one year for patients who have rapidly progressing disease, those who are receiving or discontinuing medical therapy to restore bone mass, or have additional risk factors. I have reviewed this report, and agree with the above findings. [facility] Radiology Electronically Signed   By: Claudie Revering M.D.   On: 03/26/2016 13:40     Assessment & Plan:   Problem List Items Addressed This Visit    Breast cancer screening    [place] mammogram may 2017 normal. Annual mammogram recommended given history of tobacco abuse      Relevant Orders   MM SCREENING BREAST TOMO BILATERAL   Hyperlipidemia    Tolerating potent dose statin therapy and direct LDL is < 100 .   Liver enzymes are normal , no changes today. Elevated trigs addressed with diet and exercise.   Lab Results  Component Value Date   ALT 22 08/05/2017   AST 25 08/05/2017   ALKPHOS 92 08/05/2017   BILITOT 0.6 08/05/2017   Lab Results  Component Value Date   CHOL 179 08/05/2017   HDL 44.40 08/05/2017   LDLCALC 98 01/02/2016   LDLDIRECT 114.0 08/05/2017   TRIG 215.0 (H) 08/05/2017   CHOLHDL 4 08/05/2017             Hypertension    Well controlled on current regimen. Renal function stable, no changes today.  Lab Results  Component Value Date   NA 139 08/05/2017   K 3.9 08/05/2017   CL 104 08/05/2017   CO2 27 08/05/2017   Lab Results  Component Value Date   CREATININE 0.88 08/05/2017   Lab Results  Component Value Date   MICROALBUR <0.7 02/02/2017         Hypothyroidism    Will increase levothyroxine dose to 88 mcg to aid in weight loss and repeat tsh in 6 weeks.   Lab Results  Component Value Date   TSH 2.78 08/05/2017         Relevant Medications   levothyroxine (SYNTHROID, LEVOTHROID) 88 MCG tablet   Other Relevant Orders   TSH (Completed)   Obesity    I have congratulated her in her lifestyle changes and encouraged  Continued weight loss with goal of 10% of body weigh over the next 6 months uby increasing her participation in regular exercise to  a minimum of 5 days per week.        Prediabetes    Fasting glucose was 110 ,  checking a1c today and it is 6.0 .  Encouraged to continue daily exercise and follow low GI diet. .  There is a strong  FH of type 2 DM.  Lab Results  Component Value Date   HGBA1C 6.0  08/05/2017         Tobacco abuse counseling     .Spent 4 minutes discussing risk of continued tobacco abuse, including but not limited to CAD, PAD, hypertension, and CA.  The patient was counseled on the dangers of tobacco use, and was advised to resume Chantix.    Reviewed strategies to maximize success, including removing cigarettes  and smoking materials from environment. chantix prescribed.       Other Visit Diagnoses    Impaired fasting glucose    -  Primary   Relevant Orders   Comprehensive metabolic panel (Completed)   Hemoglobin A1c (Completed)   Hyperlipidemia LDL goal <100       Relevant Orders   Lipid panel (Completed)   Elevated hemoglobin (H[facility])       Relevant Orders   CBC with Differential/Platelet (Completed)     A total of 40 minutes was spent with patient more than half of which was spent in counseling patient on the above mentioned issues , reviewing and explaining recent labs and imaging studies done, and coordination of care.   I have changed [place]. Martire's levothyroxine. I am also having her start on varenicline. Additionally, I am having her maintain her cholecalciferol, aspirin, multivitamin, ranitidine, losartan-hydrochlorothiazide, amLODipine, atorvastatin, and varenicline.  Meds ordered this encounter  Medications  . varenicline (CHANTIX STARTING MONTH PAK) 0.5 MG X 11 & 1 MG X 42 tablet    Sig: Take one 0.5 mg tablet by mouth once daily for 3 days, then increase to one 0.5 mg tablet twice daily for 4 days, then increase to one 1 mg tablet twice daily.    Dispense:  53 tablet    Refill:  0  . DISCONTD: varenicline (CHANTIX CONTINUING MONTH PAK) 1 MG tablet    Sig: Take 1 tablet (1 mg total) by mouth 2 (two) times daily.    Dispense:  60 tablet    Refill:  3  . varenicline (CHANTIX CONTINUING MONTH PAK) 1 MG tablet    Sig: Take 1 tablet (1 mg total) by mouth 2 (two) times daily.    Dispense:  60 tablet    Refill:  3  . levothyroxine (SYNTHROID,  LEVOTHROID) 88 MCG tablet    Sig: Take 1 tablet (88 mcg total) by mouth daily before breakfast.    Dispense:  90 tablet    Refill:  0    Patient needs labs prior to additional refills    Medications Discontinued During This Encounter  Medication Reason  . varenicline (CHANTIX CONTINUING MONTH PAK) 1 MG tablet Reorder  . levothyroxine (SYNTHROID, LEVOTHROID) 75 MCG tablet     Follow-up: Return in about 6 months (around 02/03/2018).   [name], MD

## 2017-08-05 NOTE — Patient Instructions (Signed)
I want you try  to quit  Smoking this year ! The chantix has become affordable,  So if you decide to do it stay on it for a minimum of 3 months (to give you time to build good habits)  Increase your activity to 5 days of cardio (30 minutes)

## 2017-08-07 ENCOUNTER — Telehealth: Payer: Self-pay | Admitting: Internal Medicine

## 2017-08-07 DIAGNOSIS — Z0000 Encounter for general adult medical examination without abnormal findings: Secondary | ICD-10-CM | POA: Insufficient documentation

## 2017-08-07 DIAGNOSIS — Z1239 Encounter for other screening for malignant neoplasm of breast: Secondary | ICD-10-CM | POA: Insufficient documentation

## 2017-08-07 MED ORDER — LEVOTHYROXINE SODIUM 88 MCG PO TABS: 88.0000 ug | ORAL_TABLET | Freq: Every day | ORAL | 0 refills | Status: DC

## 2017-08-07 NOTE — Assessment & Plan Note (Signed)
Tolerating potent dose statin therapy and direct LDL is < 100 .   Liver enzymes are normal , no changes today. Elevated trigs addressed with diet and exercise.   Lab Results  Component Value Date   ALT 22 08/05/2017   AST 25 08/05/2017   ALKPHOS 92 08/05/2017   BILITOT 0.6 08/05/2017   Lab Results  Component Value Date   CHOL 179 08/05/2017   HDL 44.40 08/05/2017   LDLCALC 98 01/02/2016   LDLDIRECT 114.0 08/05/2017   TRIG 215.0 (H) 08/05/2017   CHOLHDL 4 08/05/2017

## 2017-08-07 NOTE — Assessment & Plan Note (Addendum)
.  Spent 4 minutes discussing risk of continued tobacco abuse, including but not limited to CAD, PAD, hypertension, and CA.  The patient was counseled on the dangers of tobacco use, and was advised to resume Chantix.    Reviewed strategies to maximize success, including removing cigarettes and smoking materials from environment. chantix prescribed.

## 2017-08-07 NOTE — Telephone Encounter (Signed)
My Chart message sent

## 2017-08-07 NOTE — Assessment & Plan Note (Addendum)
I have congratulated her in her lifestyle changes and encouraged  Continued weight loss with goal of 10% of body weigh over the next 6 months uby increasing her participation in regular exercise to  a minimum of 5 days per week.

## 2017-08-07 NOTE — Assessment & Plan Note (Signed)
Fasting glucose was 110 ,  checking a1c today and it is 6.0 .  Encouraged to continue daily exercise and follow low GI diet. .  There is a strong  FH of type 2 DM.  Lab Results  Component Value Date   HGBA1C 6.0 08/05/2017

## 2017-08-07 NOTE — Assessment & Plan Note (Signed)
Will increase levothyroxine dose to 88 mcg to aid in weight loss and repeat tsh in 6 weeks.   Lab Results  Component Value Date   TSH 2.78 08/05/2017

## 2017-08-07 NOTE — Assessment & Plan Note (Signed)
Las mammogram may 2017 normal. Annual mammogram recommended given history of tobacco abuse

## 2017-08-07 NOTE — Assessment & Plan Note (Signed)
Well controlled on current regimen. Renal function stable, no changes today.  Lab Results  Component Value Date   NA 139 08/05/2017   K 3.9 08/05/2017   CL 104 08/05/2017   CO2 27 08/05/2017   Lab Results  Component Value Date   CREATININE 0.88 08/05/2017   Lab Results  Component Value Date   MICROALBUR <0.7 02/02/2017

## 2017-08-09 ENCOUNTER — Other Ambulatory Visit: Payer: Self-pay | Admitting: Internal Medicine

## 2017-08-11 IMAGING — MG MM DIGITAL SCREENING BILAT W/ TOMO W/ CAD
8 of 13 series · 8 of 29 positions shown · non-contrast
Comparison: Previous exam(s).

CLINICAL DATA: Screening.

EXAM:
2D DIGITAL SCREENING BILATERAL MAMMOGRAM WITH CAD AND ADJUNCT TOMO

[R MLO (1 of 2)]
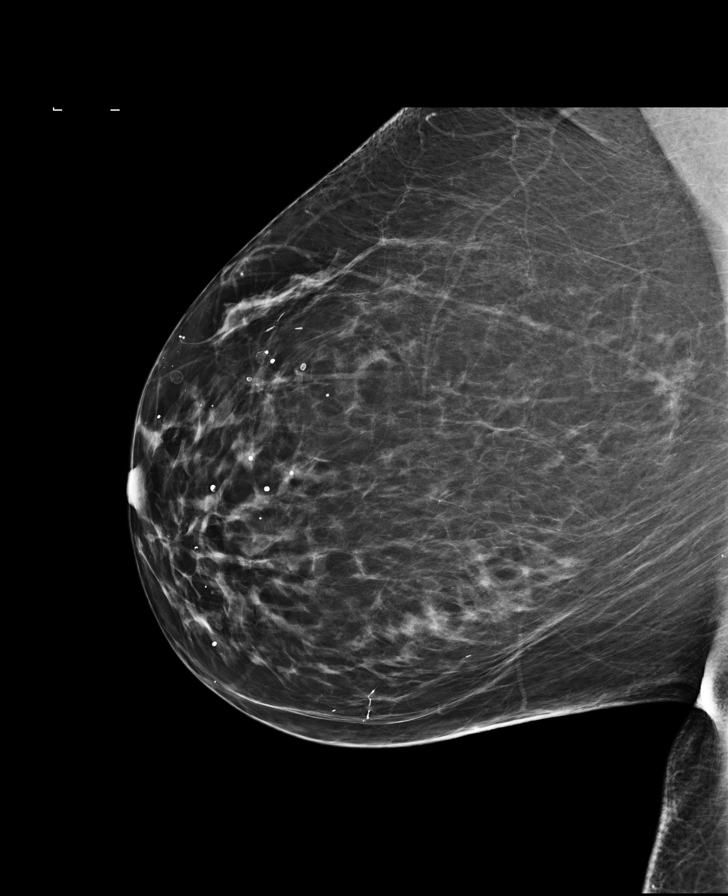

[R MLO (2 of 2)]
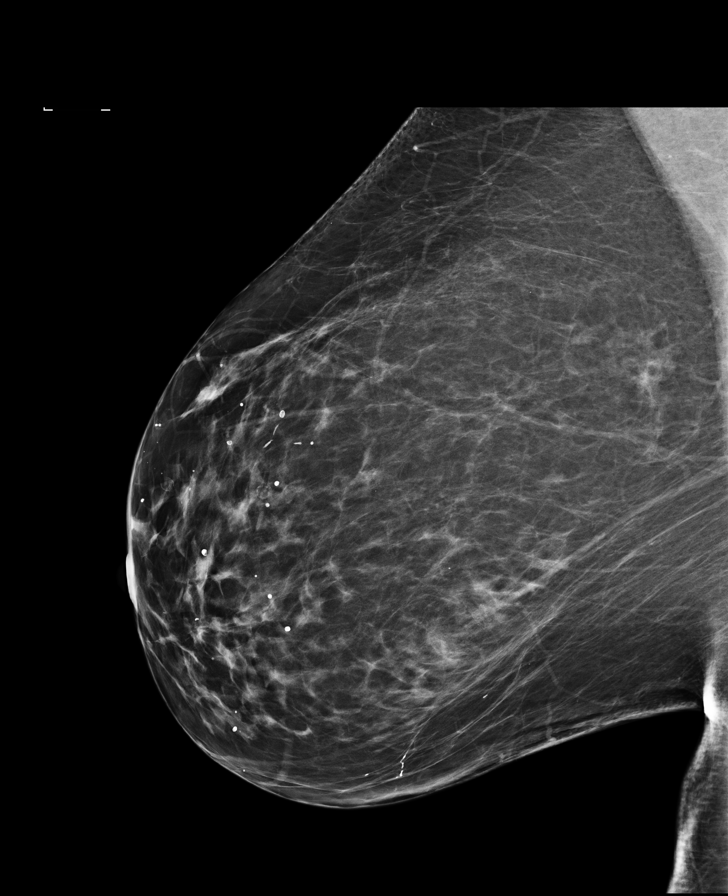

[R CC synth-2D]
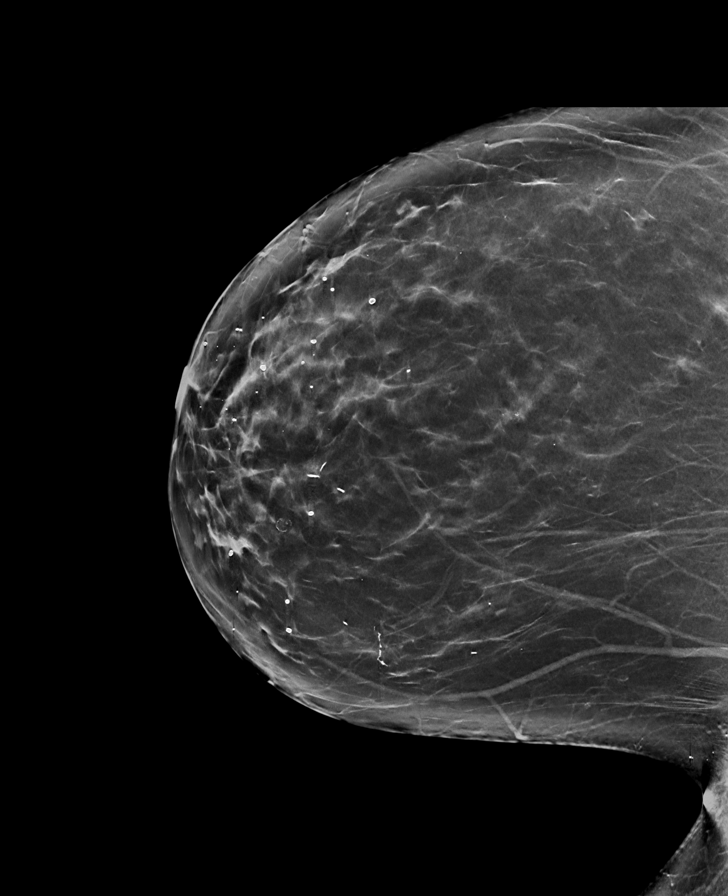

[L MLO synth-2D]
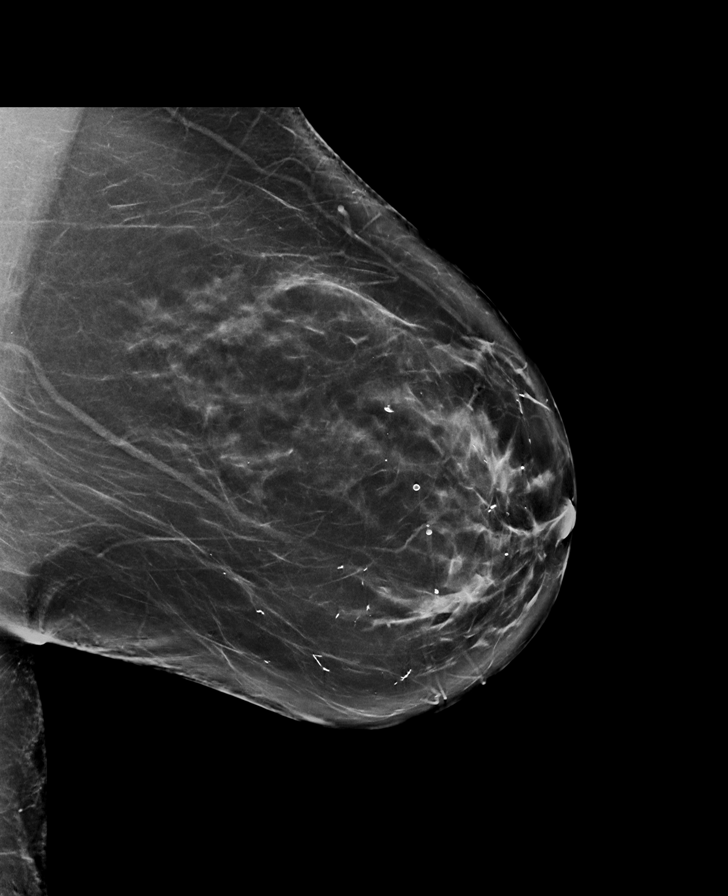

[L MLO]
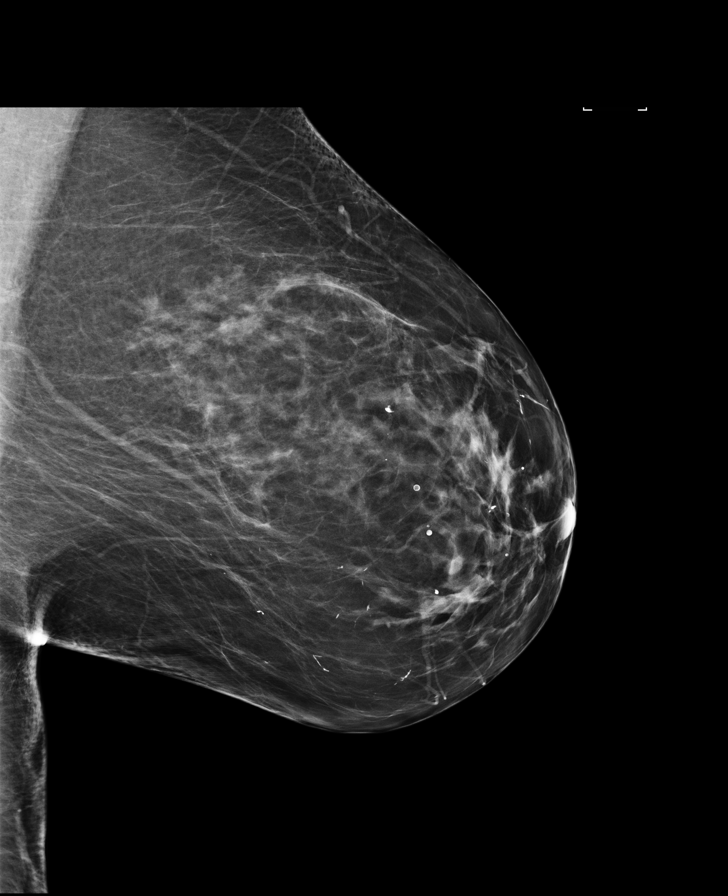

[L CC]
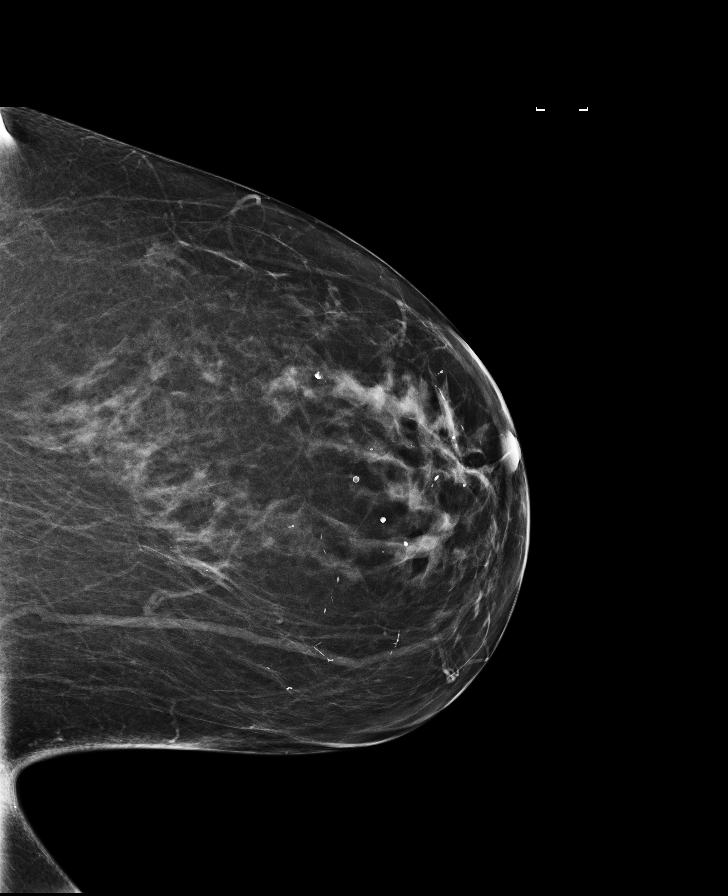

[R CC]
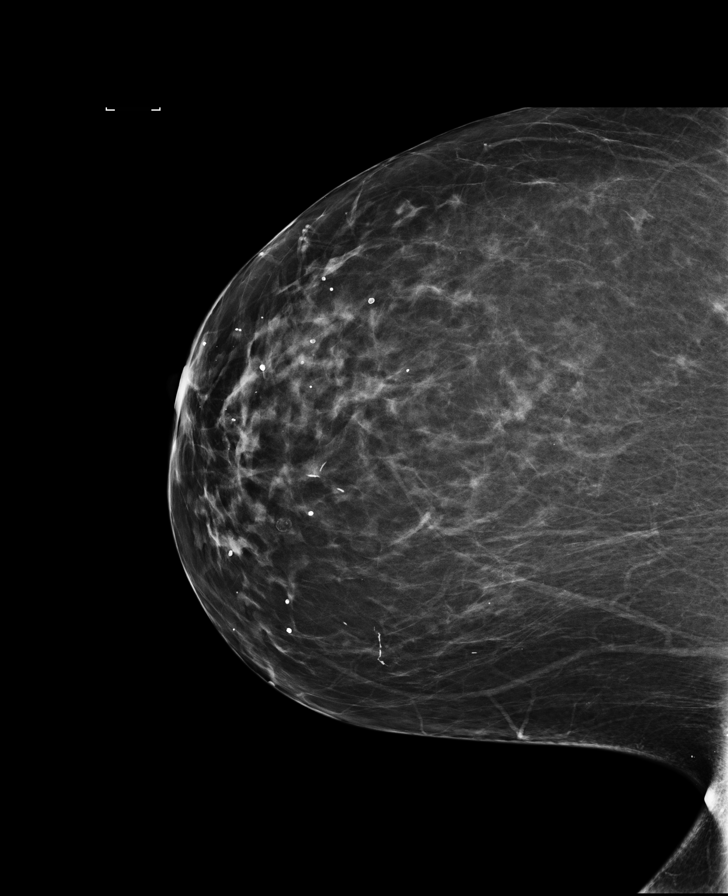

[R MLO synth-2D]
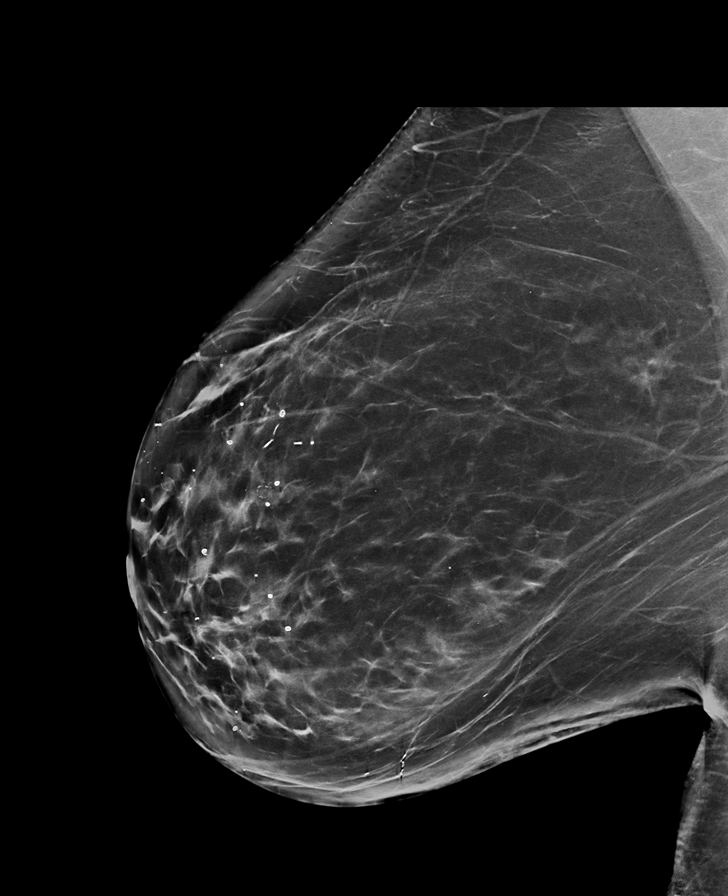

[8 of 29 positions shown; findings below may reference images not displayed]

ACR Breast Density Category b: There are scattered areas of
fibroglandular density.
FINDINGS: There are no findings suspicious for malignancy. Images were
processed with CAD.
IMPRESSION: No mammographic evidence of malignancy. A result letter of this
screening mammogram will be mailed directly to the patient.

RECOMMENDATION:
Screening mammogram in one year. (Code:97-6-RS4)

BI-RADS CATEGORY  1: Negative.

## 2017-10-08 ENCOUNTER — Other Ambulatory Visit (INDEPENDENT_AMBULATORY_CARE_PROVIDER_SITE_OTHER): Payer: Medicare Other

## 2017-10-08 DIAGNOSIS — E785 Hyperlipidemia, unspecified: Secondary | ICD-10-CM | POA: Diagnosis not present

## 2017-10-08 DIAGNOSIS — E034 Atrophy of thyroid (acquired): Secondary | ICD-10-CM

## 2017-10-08 LAB — LIPID PANEL
CHOL/HDL RATIO: 3
Cholesterol: 186 mg/dL (ref 0–200)
HDL: 55.2 mg/dL (ref >39.00)
LDL Cholesterol: 91 mg/dL (ref 0–99)
NONHDL: 131.26
Triglycerides: 200 mg/dL — ABNORMAL HIGH (ref 0.0–149.0)
VLDL: 40 mg/dL (ref 0.0–40.0)

## 2017-10-08 LAB — TSH: TSH: 2.81 u[IU]/mL (ref 0.35–4.50)

## 2017-10-27 ENCOUNTER — Other Ambulatory Visit: Payer: Self-pay | Admitting: Internal Medicine

## 2017-11-01 ENCOUNTER — Other Ambulatory Visit: Payer: Self-pay | Admitting: Internal Medicine

## 2017-11-01 DIAGNOSIS — E034 Atrophy of thyroid (acquired): Secondary | ICD-10-CM

## 2017-11-15 ENCOUNTER — Other Ambulatory Visit: Payer: Self-pay | Admitting: Internal Medicine

## 2017-11-15 MED ORDER — TELMISARTAN-HCTZ 80-25 MG PO TABS: 1.0000 | ORAL_TABLET | Freq: Every day | ORAL | 1 refills | Status: DC

## 2017-12-13 ENCOUNTER — Ambulatory Visit: Payer: PRIVATE HEALTH INSURANCE

## 2017-12-16 ENCOUNTER — Ambulatory Visit (INDEPENDENT_AMBULATORY_CARE_PROVIDER_SITE_OTHER): Payer: Medicare Other

## 2017-12-16 VITALS — BP 138/80 | HR 72 | Temp 98.4°F | Resp 15 | Ht 66.0 in | Wt 208.8 lb

## 2017-12-16 DIAGNOSIS — Z0000 Encounter for general adult medical examination without abnormal findings: Secondary | ICD-10-CM

## 2017-12-16 NOTE — Progress Notes (Signed)
Subjective:   Alexandra Copeland is a 72 y.o. female who presents for Medicare Annual (Subsequent) preventive examination.  Review of Systems:  No ROS.  Medicare Wellness Visit. Additional risk factors are reflected in the social history.  Cardiac Risk Factors include: advanced age (>8men, >25 women);hypertension;obesity (BMI >30kg/m2)     Objective:     Vitals: BP 138/80 (BP Location: Left Arm, Patient Position: Sitting, Cuff Size: Normal)   Pulse 72   Temp 98.4 F (36.9 C) (Oral)   Resp 15   Ht 5\' 6"  (1.676 m)   Wt 208 lb 12.8 oz (94.7 kg)   SpO2 97%   BMI 33.70 kg/m   Body mass index is 33.7 kg/m.  Advanced Directives 12/16/2017 12/10/2016 12/11/2015  Does Patient Have a Medical Advance Directive? Yes Yes Yes  Type of Paramedic of [place];Living will [place];Living will Living will  Does patient want to make changes to medical advance directive? No - Patient declined No - Patient declined -  Copy of [place] in Chart? No - copy requested No - copy requested No - copy requested    Tobacco Social History   Tobacco Use  Smoking Status Former Smoker  . Packs/day: 1.00  . Types: Cigarettes  . Last attempt to quit: 08/11/2017  . Years since quitting: 0.3  Smokeless Tobacco Never Used     Counseling given: Not Answered   Clinical Intake:  Pre-visit preparation completed: Yes  Pain : No/denies pain     Nutritional Status: BMI > 30  Obese Diabetes: No  How often do you need to have someone help you when you read instructions, pamphlets, or other written materials from your doctor or pharmacy?: 1 - Never  Interpreter Needed?: No     Past Medical History:  Diagnosis Date  . Hyperlipidemia   . Hypertension   . Hypothyroid   . Scarlet fever    as a child  . Stroke [facility]) June 2010   left parietal hypertensive   Past Surgical History:  Procedure Laterality Date  . BREAST CYST ASPIRATION Left  1978   neg  . BREAST SURGERY  1975   abscess drained   Family History  Problem Relation Age of Onset  . Heart disease Mother   . Hypertension Mother   . Stroke Father   . Hyperlipidemia Father   . Vision loss Father   . Hypertension Brother   . Diabetes Paternal Grandmother    Social History   Socioeconomic History  . Marital status: Single    Spouse name: Not on file  . Number of children: Not on file  . Years of education: Not on file  . Highest education level: Not on file  Occupational History  . Not on file  Social Needs  . Financial resource strain: Not hard at all  . Food insecurity:    Worry: Never true    Inability: Never true  . Transportation needs:    Medical: No    Non-medical: No  Tobacco Use  . Smoking status: Former Smoker    Packs/day: 1.00    Types: Cigarettes    Last attempt to quit: 08/11/2017    Years since quitting: 0.3  . Smokeless tobacco: Never Used  Substance and Sexual Activity  . Alcohol use: Yes    Alcohol/week: 0.6 oz    Types: 1 Glasses of wine per week    Comment: OCC glass of wine  . Drug use:  No  . Sexual activity: Never  Lifestyle  . Physical activity:    Days per week: 3 days    Minutes per session: 60 min  . Stress: Not on file  Relationships  . Social connections:    Talks on phone: Not on file    Gets together: Not on file    Attends religious service: Not on file    Active member of club or organization: Not on file    Attends meetings of clubs or organizations: Not on file    Relationship status: Not on file  Other Topics Concern  . Not on file  Social History Narrative  . Not on file    Outpatient Encounter Medications as of 12/16/2017  Medication Sig  . amLODipine (NORVASC) 2.5 MG tablet Take 1 tablet (2.5 mg total) by mouth daily.  Marland Kitchen aspirin 325 MG tablet Take 325 mg by mouth daily.  Marland Kitchen atorvastatin (LIPITOR) 80 MG tablet TAKE 1 TABLET BY MOUTH ONCE DAILY  . cholecalciferol (VITAMIN D) 1000 UNITS tablet Take  1,000 Units by mouth daily.  Marland Kitchen levothyroxine (SYNTHROID, LEVOTHROID) 88 MCG tablet TAKE 1 TABLET BY MOUTH ONCE DAILY BEFORE BREAKFAST  . losartan-hydrochlorothiazide (HYZAAR) 100-25 MG tablet Take 1 tablet by mouth daily.  . Multiple Vitamin (MULTIVITAMIN) tablet Take 1 tablet by mouth daily.  . ranitidine (ZANTAC) 75 MG tablet Take 75 mg by mouth daily.   Marland Kitchen telmisartan-hydrochlorothiazide (MICARDIS HCT) 80-25 MG tablet Take 1 tablet by mouth daily.  . varenicline (CHANTIX CONTINUING MONTH PAK) 1 MG tablet Take 1 tablet (1 mg total) by mouth 2 (two) times daily.  . varenicline (CHANTIX STARTING MONTH PAK) 0.5 MG X 11 & 1 MG X 42 tablet Take one 0.5 mg tablet by mouth once daily for 3 days, then increase to one 0.5 mg tablet twice daily for 4 days, then increase to one 1 mg tablet twice daily. (Patient not taking: Reported on 12/16/2017)   No facility-administered encounter medications on file as of 12/16/2017.     Activities of Daily Living In your present state of health, do you have any difficulty performing the following activities: 12/16/2017  Hearing? N  Vision? N  Difficulty concentrating or making decisions? Y  Comment Difficulty remembering names; age appropriate memory delay  Walking or climbing stairs? N  Dressing or bathing? N  Doing errands, shopping? N  Preparing Food and eating ? N  Using the Toilet? N  In the past six months, have you accidently leaked urine? N  Do you have problems with loss of bowel control? N  Managing your Medications? N  Managing your Finances? N  Housekeeping or managing your Housekeeping? N  Some recent data might be hidden    Patient Care Team: [name], MD as PCP - General (Internal Medicine)    Assessment:   This is a routine wellness examination for [place].  The goal of the wellness visit is to assist the patient how to close the gaps in care and create a preventative care plan for the patient.   The roster of all physicians providing  medical care to patient is listed in the Snapshot section of the chart.  Taking calcium VIT D as appropriate/Osteoporosis risk reviewed.    Safety issues reviewed; Smoke and carbon monoxide detectors in the home. No firearms or firearms locked in a safe within the home. Wears seatbelts when driving or riding with others. No violence in the home.  They do not have excessive sun exposure.  Discussed the need for sun protection: hats, long sleeves and the use of sunscreen if there is significant sun exposure.  Patient is alert, normal appearance, oriented to person/place/and time.  Correctly identified the president of the [place] and recalls of 3/3 words. Performs simple calculations and can read correct time from watch face. Displays appropriate judgement.  No new identified risk were noted.  No failures at ADL's or IADL's.    BMI- discussed the importance of a healthy diet, water intake and the benefits of aerobic exercise. Educational material provided.   24 hour diet recall: Regular diet  Dental- every 6 months.  Dr. Leanor Kail.  Eye- Visual acuity not assessed per patient preference since they have regular follow up with the ophthalmologist.  Wears corrective lenses.  Sleep patterns- Sleeps through the night.  PNA23 vaccine deferred per patient request.   Patient Concerns: None at this time. Follow up with PCP as needed.  Exercise Activities and Dietary recommendations Current Exercise Habits: Home exercise routine, Type of exercise: stretching(water aerobics), Time (Minutes): 60, Frequency (Times/Week): 3, Weekly Exercise (Minutes/Week): 180, Intensity: Moderate  Goals    . DIET - REDUCE SUGAR INTAKE     Read labels to check carb and sugar in the product 6 small meals throughout the day; Snacks <10 carbs, Meals <20 carbs      . increase lean protein     Low carb diet       Fall Risk Fall Risk  12/16/2017 12/10/2016 12/11/2015 02/05/2015 05/02/2014  Falls in the past year?  No No No No No   Depression Screen PHQ 2/9 Scores 12/16/2017 12/10/2016 12/11/2015 02/05/2015  PHQ - 2 Score 0 0 0 0  PHQ- 9 Score - 0 - -     Cognitive Function MMSE - Mini Mental State Exam 12/16/2017 12/10/2016 12/11/2015  Orientation to time 5 5 5   Orientation to Place 5 5 5   Registration 3 3 3   Attention/ Calculation 5 5 5   Recall 3 3 3   Language- name 2 objects 2 2 2   Language- repeat 1 1 1   Language- follow 3 step command 3 3 3   Language- read & follow direction 1 1 1   Write a sentence 1 1 1   Copy design 1 1 1   Total score 30 30 30         Immunization History  Administered Date(s) Administered  . Influenza,inj,Quad PF,6+ Mos 05/24/2013, 04/30/2014  . PPD Test 09/18/2014  . Pneumococcal Conjugate-13 05/28/2014  . Pneumococcal Polysaccharide-23 05/29/2010  . Tdap 05/28/2010   Screening Tests Health Maintenance  Topic Date Due  . PNA vac Low Risk Adult (2 of 2 - PPSV23) 05/30/2015  . MAMMOGRAM  01/02/2017  . COLONOSCOPY  02/02/2018 (Originally 11/10/1996)  . INFLUENZA VACCINE  04/07/2018 (Originally 03/10/2018)  . TETANUS/TDAP  05/28/2020  . DEXA SCAN  Completed  . Hepatitis C Screening  Completed      Plan:    End of life planning; Advance aging; Advanced directives discussed. Copy of current HCPOA/Living Will requested.    I have personally reviewed and noted the following in the patient's chart:   . Medical and social history . Use of alcohol, tobacco or illicit drugs  . Current medications and supplements . Functional ability and status . Nutritional status . Physical activity . Advanced directives . List of other physicians . Hospitalizations, surgeries, and ER visits in previous 12 months . Vitals . Screenings to include cognitive, depression, and falls . Referrals and appointments  In addition,  I have reviewed and discussed with patient certain preventive protocols, quality metrics, and best practice recommendations. A written personalized care plan for  preventive services as well as general preventive health recommendations were provided to patient.     Varney Biles, LPN  12/26/3435

## 2017-12-16 NOTE — Patient Instructions (Addendum)
  Alexandra Copeland , Thank you for taking time to come for your Medicare Wellness Visit. I appreciate your ongoing commitment to your health goals. Please review the following plan we discussed and let me know if I can assist you in the future.   Follow up as needed.    Bring a copy of your [place] and/or Living Will to be scanned into chart.  Have a great day!  These are the goals we discussed: Goals    . DIET - REDUCE SUGAR INTAKE     Read labels to check carb and sugar in the product 6 small meals throughout the day; Snacks <10 carbs, Meals <20 carbs      . increase lean protein     Low carb diet       This is a list of the screening recommended for you and due dates:  Health Maintenance  Topic Date Due  . Pneumonia vaccines (2 of 2 - PPSV23) 05/30/2015  . Mammogram  01/02/2017  . Colon Cancer Screening  02/02/2018*  . Flu Shot  04/07/2018*  . Tetanus Vaccine  05/28/2020  . DEXA scan (bone density measurement)  Completed  .  Hepatitis C: One time screening is recommended by Center for Disease Control  (CDC) for  adults born from [birth year] through 1964.   Completed  *Topic was postponed. The date shown is not the original due date.

## 2018-02-03 ENCOUNTER — Encounter: Payer: Self-pay | Admitting: Internal Medicine

## 2018-02-03 ENCOUNTER — Ambulatory Visit (INDEPENDENT_AMBULATORY_CARE_PROVIDER_SITE_OTHER): Payer: Medicare Other | Admitting: Internal Medicine

## 2018-02-03 VITALS — BP 138/70 | HR 67 | Temp 98.2°F | Resp 15 | Ht 66.0 in | Wt 205.2 lb

## 2018-02-03 DIAGNOSIS — E782 Mixed hyperlipidemia: Secondary | ICD-10-CM | POA: Diagnosis not present

## 2018-02-03 DIAGNOSIS — E034 Atrophy of thyroid (acquired): Secondary | ICD-10-CM | POA: Diagnosis not present

## 2018-02-03 DIAGNOSIS — R7303 Prediabetes: Secondary | ICD-10-CM | POA: Diagnosis not present

## 2018-02-03 DIAGNOSIS — F325 Major depressive disorder, single episode, in full remission: Secondary | ICD-10-CM | POA: Diagnosis not present

## 2018-02-03 DIAGNOSIS — I10 Essential (primary) hypertension: Secondary | ICD-10-CM

## 2018-02-03 DIAGNOSIS — Z87891 Personal history of nicotine dependence: Secondary | ICD-10-CM

## 2018-02-03 LAB — POCT GLYCOSYLATED HEMOGLOBIN (HGB A1C): Hemoglobin A1C: 5.8 % — AB (ref 4.0–5.6)

## 2018-02-03 MED ORDER — LEVOTHYROXINE SODIUM 88 MCG PO TABS: ORAL_TABLET | ORAL | 1 refills | Status: DC

## 2018-02-03 MED ORDER — AMLODIPINE BESYLATE 2.5 MG PO TABS: 2.5000 mg | ORAL_TABLET | Freq: Every day | ORAL | 3 refills | Status: DC

## 2018-02-03 NOTE — Patient Instructions (Signed)
Congratulations on quitting smoking!   I am SO PROUD OF YOU!!   I'll see you in 6 months,  But please check your BP a few times over the next month and let me know your readings.   Return in 3 months for fasting labs

## 2018-02-03 NOTE — Progress Notes (Signed)
Subjective:  Patient ID: Alexandra Copeland, female    DOB: 1945/10/24  Age: 72 y.o. MRN: [id number]  CC: The primary encounter diagnosis was Prediabetes. Diagnoses of Hypothyroidism due to acquired atrophy of thyroid, Mixed hyperlipidemia, Major depressive disorder with single episode, in full remission ([place]), History of tobacco abuse, and Essential hypertension were also pertinent to this visit.  HPI Alexandra Copeland presents for  Follow upon hypertension,  Impaired fasting glucose,  Obesity and tobacco abuse.  Patient is taking her medications as prescribed and notes no adverse effects.  Home BP readings have been done about once per week and are  generally < 130/80 .  She is avoiding added salt in her diet and has [facility] per [facility] for exercise  .  She quit smoking sevral months ago using chantix,  And in spite of her insurance not paying for the maintenance dose of chantix,  She has remained abstinent for several months.  She has gained weight   Outpatient Medications Prior to Visit  Medication Sig Dispense Refill  . aspirin 325 MG tablet Take 325 mg by mouth daily.    Marland Kitchen atorvastatin (LIPITOR) 80 MG tablet TAKE 1 TABLET BY MOUTH ONCE DAILY 90 tablet 1  . cholecalciferol (VITAMIN D) 1000 UNITS tablet Take 1,000 Units by mouth daily.    Marland Kitchen losartan-hydrochlorothiazide (HYZAAR) 100-25 MG tablet Take 1 tablet by mouth daily.    . Multiple Vitamin (MULTIVITAMIN) tablet Take 1 tablet by mouth daily.    . ranitidine (ZANTAC) 75 MG tablet Take 75 mg by mouth daily.     Marland Kitchen amLODipine (NORVASC) 2.5 MG tablet Take 1 tablet (2.5 mg total) by mouth daily. 90 tablet 3  . levothyroxine (SYNTHROID, LEVOTHROID) 88 MCG tablet TAKE 1 TABLET BY MOUTH ONCE DAILY BEFORE BREAKFAST 90 tablet 0  . telmisartan-hydrochlorothiazide (MICARDIS HCT) 80-25 MG tablet Take 1 tablet by mouth daily. (Patient not taking: Reported on 02/03/2018) 90 tablet 1  .  varenicline (CHANTIX CONTINUING MONTH PAK) 1 MG tablet Take 1 tablet (1 mg total) by mouth 2 (two) times daily. (Patient not taking: Reported on 02/03/2018) 60 tablet 3  . varenicline (CHANTIX STARTING MONTH PAK) 0.5 MG X 11 & 1 MG X 42 tablet Take one 0.5 mg tablet by mouth once daily for 3 days, then increase to one 0.5 mg tablet twice daily for 4 days, then increase to one 1 mg tablet twice daily. (Patient not taking: Reported on 12/16/2017) 53 tablet 0   No facility-administered medications prior to visit.     Review of Systems;  Patient denies headache, fevers, malaise, unintentional weight loss, skin rash, eye pain, sinus congestion and sinus pain, sore throat, dysphagia,  hemoptysis , cough, dyspnea, wheezing, chest pain, palpitations, orthopnea, edema, abdominal pain, nausea, melena, diarrhea, constipation, flank pain, dysuria, hematuria, urinary  Frequency, nocturia, numbness, tingling, seizures,  Focal weakness, Loss of consciousness,  Tremor, insomnia, depression, anxiety, and suicidal ideation.      Objective:  BP 138/70 (BP Location: Left Arm, Patient Position: Sitting, Cuff Size: Normal)   Pulse 67   Temp 98.2 F (36.8 C) (Oral)   Resp 15   Ht 5\' 6"  (1.676 m)   Wt 205 lb 3.2 oz (93.1 kg)   SpO2 97%   BMI 33.12 kg/m   BP Readings from Last 3 Encounters:  02/03/18 138/70  12/16/17 138/80  08/05/17 132/84    Wt Readings from Last 3  Encounters:  02/03/18 205 lb 3.2 oz (93.1 kg)  12/16/17 208 lb 12.8 oz (94.7 kg)  08/05/17 196 lb 9.6 oz (89.2 kg)    General appearance: alert, cooperative and appears stated age Ears: normal TM's and external ear canals both ears Throat: lips, mucosa, and tongue normal; teeth and gums normal Neck: no adenopathy, no carotid bruit, supple, symmetrical, trachea midline and thyroid not enlarged, symmetric, no tenderness/mass/nodules Back: symmetric, no curvature. ROM normal. No CVA tenderness. Lungs: clear to auscultation bilaterally Heart:  regular rate and rhythm, S1, S2 normal, no murmur, click, rub or gallop Abdomen: soft, non-tender; bowel sounds normal; no masses,  no organomegaly Pulses: 2+ and symmetric Skin: Skin color, texture, turgor normal. No rashes or lesions Lymph nodes: Cervical, supraclavicular, and axillary nodes normal.  Lab Results  Component Value Date   HGBA1C 5.8 (A) 02/03/2018   HGBA1C 6.0 08/05/2017   HGBA1C 5.9 02/02/2017    Lab Results  Component Value Date   CREATININE 0.88 08/05/2017   CREATININE 0.85 02/02/2017   CREATININE 0.90 01/02/2016    Lab Results  Component Value Date   WBC 6.0 08/05/2017   HGB 15.5 (H) 08/05/2017   HCT 45.9 08/05/2017   PLT 204.0 08/05/2017   GLUCOSE 106 (H) 08/05/2017   CHOL 186 10/08/2017   TRIG 200.0 (H) 10/08/2017   HDL 55.20 10/08/2017   LDLDIRECT 114.0 08/05/2017   LDLCALC 91 10/08/2017   ALT 22 08/05/2017   AST 25 08/05/2017   NA 139 08/05/2017   K 3.9 08/05/2017   CL 104 08/05/2017   CREATININE 0.88 08/05/2017   BUN 16 08/05/2017   CO2 27 08/05/2017   TSH 2.81 10/08/2017   HGBA1C 5.8 (A) 02/03/2018   MICROALBUR <0.7 02/02/2017    Dg Bone Density  Result Date: 03/26/2016 EXAM: DUAL X-RAY ABSORPTIOMETRY (DXA) FOR BONE MINERAL DENSITY IMPRESSION: Dear Dr. Derrel Nip, Your patient Alexandra Copeland completed a BMD test on 03/26/2016 using the [place] (analysis version: 14.10) manufactured by [employer]. The following summarizes the results of our evaluation. PATIENT BIOGRAPHICAL: Name: Alexandra Copeland, Alexandra Copeland Patient ID: [id number] Birth Date: [birth year] Height: 65.5 in. Gender: Female Exam Date: 03/26/2016 Weight: 191.0 lbs. Indications: Caucasian, Height Loss, Hypothyroid, Postmenopausal, Tobacco User(Current Smoker) Fractures: Treatments: aspirin, lipitor, Multi-Vitamin with calcium, Synthroid, Vit D, zantac ASSESSMENT: The BMD measured at Femur Neck Right is 0.962 g/cm2 with a T-score of -0.5. This patient is considered normal according to  [facility]) criteria. Site Region Measured Measured WHO Young Adult BMD Date       Age      Classification T-score AP Spine L1-L4 4/26/2016 69.3 Normal 0.1 1.211 g/cm2 AP Spine L1-L4 08/08/2008 61.7 Normal 0.2 1.216 g/cm2 DualFemur Neck Right 03/26/2016 69.3 Normal -0.5 0.962 g/cm2 DualFemur Neck Right [birth year]2008 61.7 Normal -0.1 1.026 g/cm2 World Health Organization [facility]) criteria for post-menopausal, Caucasian Women: Normal:       T-score at or above -1 SD Osteopenia:   T-score between -1 and -2.5 SD Osteoporosis: T-score at or below -2.5 SD RECOMMENDATIONS: [place] recommends that FDA-approved medical therapies be considered in postmenopausal women and men age 105 or older with a: 1. Hip or vertebral (clinical or morphometric) fracture. 2. T-score of < -2.5 at the spine or hip. 3. Ten-year fracture probability by FRAX of 3% or greater for hip fracture or 20% or greater for major osteoporotic fracture. All treatment decisions require clinical judgment and consideration of individual patient factors, including patient preferences, co-morbidities, previous  drug use, risk factors not captured in the FRAX model (e.g. falls, vitamin D deficiency, increased bone turnover, interval significant decline in bone density) and possible under - or over-estimation of fracture risk by FRAX. All patients should ensure an adequate intake of dietary calcium (1200 mg/d) and vitamin D (800 IU daily) unless contraindicated. FOLLOW-UP: People with diagnosed cases of osteoporosis or at high risk for fracture should have regular bone mineral density tests. For patients eligible for Medicare, routine testing is allowed once every 2 years. The testing frequency can be increased to one year for patients who have rapidly progressing disease, those who are receiving or discontinuing medical therapy to restore bone mass, or have additional risk factors. I have reviewed this report, and agree with  the above findings. [facility] Radiology Electronically Signed   By: Claudie Revering M.D.   On: 03/26/2016 13:40    Assessment & Plan:   Problem List Items Addressed This Visit    Hyperlipidemia   Relevant Medications   amLODipine (NORVASC) 2.5 MG tablet   Hypertension    Well controlled on current regimen. Renal function stable, no changes today.  Lab Results  Component Value Date   CREATININE 0.88 08/05/2017   Lab Results  Component Value Date   NA 139 08/05/2017   K 3.9 08/05/2017   CL 104 08/05/2017   CO2 27 08/05/2017         Relevant Medications   amLODipine (NORVASC) 2.5 MG tablet   Major depressive disorder, single episode    Her mood has improved.  She is attending community adult water aerobics  Classes and has joined a ukelele group at her [employer]       Hypothyroidism    Thyroid function is WNL on current dose.  No current changes needed.   Lab Results  Component Value Date   TSH 2.81 10/08/2017         Relevant Medications   levothyroxine (SYNTHROID, LEVOTHROID) 88 MCG tablet   Other Relevant Orders   TSH   Lipid panel   Prediabetes - Primary    Improved a1d.  Fasting glucose was 110 ,  a1c today  Has been lowered to 5.8 from 6.0 previously , attributed to exercise,   .  Encouraged to continue daily exercise and follow low GI diet. .  There is a strong  FH of type 2 DM.  Lab Results  Component Value Date   HGBA1C 5.8 (A) 02/03/2018         Relevant Orders   POCT HgB A1C (Completed)   Comprehensive metabolic panel   Microalbumin / creatinine urine ratio   History of tobacco abuse    She has been abstinent for several months UNAIDED BY INSURANCE NON COVERAGE OF CHANTIX After the starting dose.        A total of 25 minutes of face to face time was spent with patient more than half of which was spent in counselling about the above mentioned conditions  and coordination of care   I have discontinued Alexandra Copeland's varenicline, varenicline,  and telmisartan-hydrochlorothiazide. I am also having her maintain her cholecalciferol, aspirin, multivitamin, ranitidine, atorvastatin, losartan-hydrochlorothiazide, levothyroxine, and amLODipine.  Meds ordered this encounter  Medications  . levothyroxine (SYNTHROID, LEVOTHROID) 88 MCG tablet    Sig: TAKE 1 TABLET BY MOUTH ONCE DAILY BEFORE BREAKFAST    Dispense:  90 tablet    Refill:  1  . amLODipine (NORVASC) 2.5 MG tablet    Sig: Take 1 tablet (  2.5 mg total) by mouth daily.    Dispense:  90 tablet    Refill:  3    Medications Discontinued During This Encounter  Medication Reason  . telmisartan-hydrochlorothiazide (MICARDIS HCT) 80-25 MG tablet Change in therapy  . varenicline (CHANTIX CONTINUING MONTH PAK) 1 MG tablet Patient has not taken in last 30 days  . varenicline (CHANTIX STARTING MONTH PAK) 0.5 MG X 11 & 1 MG X 42 tablet Patient has not taken in last 30 days  . levothyroxine (SYNTHROID, LEVOTHROID) 88 MCG tablet Reorder  . amLODipine (NORVASC) 2.5 MG tablet Reorder    Follow-up: Return in about 6 months (around 08/05/2018).   [name], MD

## 2018-02-05 DIAGNOSIS — Z87891 Personal history of nicotine dependence: Secondary | ICD-10-CM

## 2018-02-05 HISTORY — DX: Personal history of nicotine dependence: Z87.891

## 2018-02-05 NOTE — Assessment & Plan Note (Addendum)
Her mood has improved.  She is attending community adult water aerobics  Classes and has joined a ukelele group at her [employer]  

## 2018-02-05 NOTE — Assessment & Plan Note (Addendum)
Improved a1d.  Fasting glucose was 110 ,  a1c today  Has been lowered to 5.8 from 6.0 previously , attributed to exercise,   .  Encouraged to continue daily exercise and follow low GI diet. .  There is a strong  FH of type 2 DM.  Lab Results  Component Value Date   HGBA1C 5.8 (A) 02/03/2018

## 2018-02-05 NOTE — Assessment & Plan Note (Signed)
Thyroid function is WNL on current dose.  No current changes needed.   Lab Results  Component Value Date   TSH 2.81 10/08/2017

## 2018-02-05 NOTE — Assessment & Plan Note (Signed)
She has been abstinent for several months UNAIDED [place] After the starting dose.

## 2018-02-05 NOTE — Assessment & Plan Note (Signed)
Well controlled on current regimen. Renal function stable, no changes today.  Lab Results  Component Value Date   CREATININE 0.88 08/05/2017   Lab Results  Component Value Date   NA 139 08/05/2017   K 3.9 08/05/2017   CL 104 08/05/2017   CO2 27 08/05/2017

## 2018-05-03 ENCOUNTER — Other Ambulatory Visit: Payer: Self-pay | Admitting: Internal Medicine

## 2018-05-10 ENCOUNTER — Other Ambulatory Visit: Payer: Self-pay | Admitting: Internal Medicine

## 2018-05-10 DIAGNOSIS — E034 Atrophy of thyroid (acquired): Secondary | ICD-10-CM

## 2018-05-10 NOTE — Telephone Encounter (Signed)
Looks like this medication was discontinued on 11/15/2017 but is now a historical medication in pt's medication list.  Last OV: 02/03/2018 Next OV: 08/11/2018

## 2018-05-10 NOTE — Telephone Encounter (Signed)
She is overdue for labs that were ordered in June, please call her and schedule

## 2018-05-12 ENCOUNTER — Other Ambulatory Visit (INDEPENDENT_AMBULATORY_CARE_PROVIDER_SITE_OTHER): Payer: Medicare Other

## 2018-05-12 DIAGNOSIS — E034 Atrophy of thyroid (acquired): Secondary | ICD-10-CM

## 2018-05-12 DIAGNOSIS — R7303 Prediabetes: Secondary | ICD-10-CM

## 2018-05-12 LAB — MICROALBUMIN / CREATININE URINE RATIO
Creatinine,U: 29.3 mg/dL
MICROALB/CREAT RATIO: 2.4 mg/g (ref 0.0–30.0)
Microalb, Ur: <0.7 mg/dL (ref 0.0–1.9)

## 2018-05-12 LAB — LIPID PANEL
CHOL/HDL RATIO: 4
CHOLESTEROL: 172 mg/dL (ref 0–200)
HDL: 43.4 mg/dL (ref >39.00)
LDL CALC: 91 mg/dL (ref 0–99)
NonHDL: 128.75
TRIGLYCERIDES: 190 mg/dL — AB (ref 0.0–149.0)
VLDL: 38 mg/dL (ref 0.0–40.0)

## 2018-05-12 LAB — COMPREHENSIVE METABOLIC PANEL
ALBUMIN: 4.3 g/dL (ref 3.5–5.2)
ALT: 18 U/L (ref 0–35)
AST: 21 U/L (ref 0–37)
Alkaline Phosphatase: 94 U/L (ref 39–117)
BUN: 17 mg/dL (ref 6–23)
CHLORIDE: 103 meq/L (ref 96–112)
CO2: 29 mEq/L (ref 19–32)
CREATININE: 0.78 mg/dL (ref 0.40–1.20)
Calcium: 9.4 mg/dL (ref 8.4–10.5)
GFR: 77.27 mL/min (ref >60.00)
GLUCOSE: 143 mg/dL — AB (ref 70–99)
POTASSIUM: 3.6 meq/L (ref 3.5–5.1)
SODIUM: 139 meq/L (ref 135–145)
TOTAL PROTEIN: 6.8 g/dL (ref 6.0–8.3)
Total Bilirubin: 0.4 mg/dL (ref 0.2–1.2)

## 2018-05-12 LAB — TSH: TSH: 2.42 u[IU]/mL (ref 0.35–4.50)

## 2018-05-12 NOTE — Addendum Note (Signed)
Addended by: Arby Barrette on: 05/12/2018 10:52 AM   Modules accepted: Orders

## 2018-05-12 NOTE — Addendum Note (Signed)
Addended by: Arby Barrette on: 05/12/2018 08:16 AM   Modules accepted: Orders

## 2018-05-14 ENCOUNTER — Encounter: Payer: Self-pay | Admitting: Internal Medicine

## 2018-06-14 ENCOUNTER — Other Ambulatory Visit: Payer: Self-pay | Admitting: Internal Medicine

## 2018-07-18 ENCOUNTER — Other Ambulatory Visit: Payer: Self-pay | Admitting: Internal Medicine

## 2018-08-11 ENCOUNTER — Ambulatory Visit (INDEPENDENT_AMBULATORY_CARE_PROVIDER_SITE_OTHER): Payer: Medicare Other | Admitting: Internal Medicine

## 2018-08-11 DIAGNOSIS — E034 Atrophy of thyroid (acquired): Secondary | ICD-10-CM | POA: Diagnosis not present

## 2018-08-11 DIAGNOSIS — Z1239 Encounter for other screening for malignant neoplasm of breast: Secondary | ICD-10-CM

## 2018-08-11 DIAGNOSIS — Z23 Encounter for immunization: Secondary | ICD-10-CM | POA: Diagnosis not present

## 2018-08-11 DIAGNOSIS — E782 Mixed hyperlipidemia: Secondary | ICD-10-CM

## 2018-08-11 DIAGNOSIS — Z1211 Encounter for screening for malignant neoplasm of colon: Secondary | ICD-10-CM | POA: Diagnosis not present

## 2018-08-11 DIAGNOSIS — F325 Major depressive disorder, single episode, in full remission: Secondary | ICD-10-CM | POA: Diagnosis not present

## 2018-08-11 DIAGNOSIS — I10 Essential (primary) hypertension: Secondary | ICD-10-CM

## 2018-08-11 DIAGNOSIS — E119 Type 2 diabetes mellitus without complications: Secondary | ICD-10-CM | POA: Diagnosis not present

## 2018-08-11 DIAGNOSIS — Z794 Long term (current) use of insulin: Secondary | ICD-10-CM

## 2018-08-11 LAB — COMPREHENSIVE METABOLIC PANEL
ALK PHOS: 102 U/L (ref 39–117)
ALT: 21 U/L (ref 0–35)
AST: 23 U/L (ref 0–37)
Albumin: 4.4 g/dL (ref 3.5–5.2)
BUN: 11 mg/dL (ref 6–23)
CO2: 26 meq/L (ref 19–32)
Calcium: 9.8 mg/dL (ref 8.4–10.5)
Chloride: 105 mEq/L (ref 96–112)
Creatinine, Ser: 0.82 mg/dL (ref 0.40–1.20)
GFR: 72.89 mL/min (ref >60.00)
GLUCOSE: 112 mg/dL — AB (ref 70–99)
POTASSIUM: 4.4 meq/L (ref 3.5–5.1)
SODIUM: 141 meq/L (ref 135–145)
TOTAL PROTEIN: 6.8 g/dL (ref 6.0–8.3)
Total Bilirubin: 0.6 mg/dL (ref 0.2–1.2)

## 2018-08-11 LAB — HEMOGLOBIN A1C: HEMOGLOBIN A1C: 6 % (ref 4.6–6.5)

## 2018-08-11 MED ORDER — LEVOTHYROXINE SODIUM 88 MCG PO TABS: 88.0000 ug | ORAL_TABLET | Freq: Every day | ORAL | 0 refills | Status: DC

## 2018-08-11 MED ORDER — TELMISARTAN-HCTZ 80-25 MG PO TABS: 1.0000 | ORAL_TABLET | Freq: Every day | ORAL | 5 refills | Status: DC

## 2018-08-11 MED ORDER — OMEPRAZOLE MAGNESIUM 20 MG PO TBEC: 20.0000 mg | DELAYED_RELEASE_TABLET | Freq: Every day | ORAL | 1 refills | Status: DC

## 2018-08-11 NOTE — Assessment & Plan Note (Signed)
Given her history of hypertensive CVA, goal BP is < 120/70.  She as been out of losartan for one week.   Prescribing telmisartan hct instead .  Continue amlodipine    Lab Results  Component Value Date   CREATININE 0.82 08/11/2018   Lab Results  Component Value Date   NA 141 08/11/2018   K 4.4 08/11/2018   CL 105 08/11/2018   CO2 26 08/11/2018   Lab Results  Component Value Date   MICROALBUR <0.7 05/12/2018

## 2018-08-11 NOTE — Assessment & Plan Note (Signed)
Her mood has improved.  She is attending community adult water aerobics  Classes and has joined a ukelele group at her church

## 2018-08-11 NOTE — Progress Notes (Signed)
Subjective:  Patient ID: Alexandra Copeland, female    DOB: 11-Feb-1946  Age: 73 y.o. MRN: [id number]  CC: The primary encounter diagnosis was Diabetes mellitus without complication ([place]). Diagnoses of Breast cancer screening, Hypothyroidism due to acquired atrophy of thyroid, Colon cancer screening, Needs flu shot, Type 2 diabetes mellitus without complication, with long-term current use of insulin ([place]), Mixed hyperlipidemia, Essential hypertension, and Major depressive disorder with single episode, in full remission [facility]) were also pertinent to this visit.  HPI Alexandra Copeland presents for follow up on hypertension, hyperlipidemia and hypothyroid.  New onset type 2 DM diagnosed at last visit  Father developed blindness by age 60 cause unknown, but had a massive CVA in his late 69's  Patient having vision changes and has not had an eye exam In the last year.  Has a sweet tooth : cakes ,  Candies   Ran out of losartan hct one week ago . Doubled dose of amlodipine  last week   Participates in Water aerobics 3 times per week,  Walks the other days   Lab Results  Component Value Date   TSH 2.42 05/12/2018     Outpatient Medications Prior to Visit  Medication Sig Dispense Refill  . amLODipine (NORVASC) 2.5 MG tablet Take 1 tablet (2.5 mg total) by mouth daily. 90 tablet 3  . aspirin 325 MG tablet Take 325 mg by mouth daily.    Marland Kitchen atorvastatin (LIPITOR) 80 MG tablet TAKE 1 TABLET BY MOUTH ONCE DAILY 90 tablet 1  . cholecalciferol (VITAMIN D) 1000 UNITS tablet Take 1,000 Units by mouth daily.    . Multiple Vitamin (MULTIVITAMIN) tablet Take 1 tablet by mouth daily.    Marland Kitchen levothyroxine (SYNTHROID, LEVOTHROID) 88 MCG tablet TAKE 1 TABLET BY MOUTH ONCE DAILY BEFORE BREAKFAST 90 tablet 0  . omeprazole (PRILOSEC OTC) 20 MG tablet Take 20 mg by mouth daily.    Marland Kitchen losartan-hydrochlorothiazide (HYZAAR) 100-25 MG tablet TAKE 1 TABLET BY MOUTH ONCE DAILY (LABS  NEEDED  PRIOR  TO  ANY  MORE  REFILLS)  (Patient not taking: Reported on 08/11/2018) 30 tablet 0  . ranitidine (ZANTAC) 75 MG tablet Take 75 mg by mouth daily.      No facility-administered medications prior to visit.     Review of Systems;  Patient denies headache, fevers, malaise, unintentional weight loss, skin rash, eye pain, sinus congestion and sinus pain, sore throat, dysphagia,  hemoptysis , cough, dyspnea, wheezing, chest pain, palpitations, orthopnea, edema, abdominal pain, nausea, melena, diarrhea, constipation, flank pain, dysuria, hematuria, urinary  Frequency, nocturia, numbness, tingling, seizures,  Focal weakness, Loss of consciousness,  Tremor, insomnia, depression, anxiety, and suicidal ideation.      Objective:  There were no vitals taken for this visit.  BP Readings from Last 3 Encounters:  02/03/18 138/70  12/16/17 138/80  08/05/17 132/84    Wt Readings from Last 3 Encounters:  02/03/18 205 lb 3.2 oz (93.1 kg)  12/16/17 208 lb 12.8 oz (94.7 kg)  08/05/17 196 lb 9.6 oz (89.2 kg)    General appearance: alert, cooperative and appears stated age Ears: normal TM's and external ear canals both ears Throat: lips, mucosa, and tongue normal; teeth and gums normal Neck: no adenopathy, no carotid bruit, supple, symmetrical, trachea midline and thyroid not enlarged, symmetric, no tenderness/mass/nodules Back: symmetric, no curvature. ROM normal. No CVA tenderness. Lungs: clear to auscultation bilaterally Heart: regular rate and rhythm, S1, S2 normal, no murmur, click, rub or gallop Abdomen: soft,  non-tender; bowel sounds normal; no masses,  no organomegaly Pulses: 2+ and symmetric Skin: Skin color, texture, turgor normal. No rashes or lesions Lymph nodes: Cervical, supraclavicular, and axillary nodes normal. Foot exam:  Nails are well trimmed,  No callouses,  Sensation intact to microfilament   Lab Results  Component Value Date   HGBA1C 6.0 08/11/2018   HGBA1C 5.8 (A) 02/03/2018   HGBA1C 6.0 08/05/2017      Lab Results  Component Value Date   CREATININE 0.82 08/11/2018   CREATININE 0.78 05/12/2018   CREATININE 0.88 08/05/2017    Lab Results  Component Value Date   WBC 6.0 08/05/2017   HGB 15.5 (H) 08/05/2017   HCT 45.9 08/05/2017   PLT 204.0 08/05/2017   GLUCOSE 112 (H) 08/11/2018   CHOL 172 05/12/2018   TRIG 190.0 (H) 05/12/2018   HDL 43.40 05/12/2018   LDLDIRECT 114.0 08/05/2017   LDLCALC 91 05/12/2018   ALT 21 08/11/2018   AST 23 08/11/2018   NA 141 08/11/2018   K 4.4 08/11/2018   CL 105 08/11/2018   CREATININE 0.82 08/11/2018   BUN 11 08/11/2018   CO2 26 08/11/2018   TSH 2.42 05/12/2018   HGBA1C 6.0 08/11/2018   MICROALBUR <0.7 05/12/2018    Dg Bone Density  Result Date: 03/26/2016 EXAM: DUAL X-RAY ABSORPTIOMETRY (DXA) FOR BONE MINERAL DENSITY IMPRESSION: Dear Dr. Derrel Nip, Your patient Alexandra Copeland completed a BMD test on 03/26/2016 using the [place] (analysis version: 14.10) manufactured by [employer]. The following summarizes the results of our evaluation. PATIENT BIOGRAPHICAL: Name: Alexandra Copeland, Alexandra Copeland Patient ID: [id number] Birth Date: 12-Apr-1946 Height: 65.5 in. Gender: Female Exam Date: 03/26/2016 Weight: 191.0 lbs. Indications: Caucasian, Height Loss, Hypothyroid, Postmenopausal, Tobacco User(Current Smoker) Fractures: Treatments: aspirin, lipitor, Multi-Vitamin with calcium, Synthroid, Vit D, zantac ASSESSMENT: The BMD measured at Femur Neck Right is 0.962 g/cm2 with a T-score of -0.5. This patient is considered normal according to [place] [facility]) criteria. Site Region Measured Measured WHO Young Adult BMD Date       Age      Classification T-score AP Spine L1-L4 4/26/2016 69.3 Normal 0.1 1.211 g/cm2 AP Spine L1-L4 08/08/2008 61.7 Normal 0.2 1.216 g/cm2 DualFemur Neck Right 03/26/2016 69.3 Normal -0.5 0.962 g/cm2 DualFemur Neck Right 08[birth year]08/2008 61.7 Normal -0.1 1.026 g/cm2 World Health Organization [facility]) criteria for post-menopausal,  Caucasian Women: Normal:       T-score at or above -1 SD Osteopenia:   T-score between -1 and -2.5 SD Osteoporosis: T-score at or below -2.5 SD RECOMMENDATIONS: [place] recommends that FDA-approved medical therapies be considered in postmenopausal women and men age 38 or older with a: 1. Hip or vertebral (clinical or morphometric) fracture. 2. T-score of < -2.5 at the spine or hip. 3. Ten-year fracture probability by FRAX of 3% or greater for hip fracture or 20% or greater for major osteoporotic fracture. All treatment decisions require clinical judgment and consideration of individual patient factors, including patient preferences, co-morbidities, previous drug use, risk factors not captured in the FRAX model (e.g. falls, vitamin D deficiency, increased bone turnover, interval significant decline in bone density) and possible under - or over-estimation of fracture risk by FRAX. All patients should ensure an adequate intake of dietary calcium (1200 mg/d) and vitamin D (800 IU daily) unless contraindicated. FOLLOW-UP: People with diagnosed cases of osteoporosis or at high risk for fracture should have regular bone mineral density tests. For patients eligible for Medicare, routine testing is allowed once every  2 years. The testing frequency can be increased to one year for patients who have rapidly progressing disease, those who are receiving or discontinuing medical therapy to restore bone mass, or have additional risk factors. I have reviewed this report, and agree with the above findings. [facility] Radiology Electronically Signed   By: Claudie Revering M.D.   On: 03/26/2016 13:40    Assessment & Plan:   Problem List Items Addressed This Visit    Breast cancer screening   Relevant Orders   MM 3D SCREEN BREAST BILATERAL   Colon cancer screening   Relevant Orders   Cologuard   Hyperlipidemia    Tolerating potent dose statin therapy and direct LDL ar or near 100 on maximal dose of  atorvastatin  .   Liver enzymes are normal , no changes today. Elevated trigs addressed with diet and exercise.   Lab Results  Component Value Date   ALT 21 08/11/2018   AST 23 08/11/2018   ALKPHOS 102 08/11/2018   BILITOT 0.6 08/11/2018   Lab Results  Component Value Date   CHOL 172 05/12/2018   HDL 43.40 05/12/2018   LDLCALC 91 05/12/2018   LDLDIRECT 114.0 08/05/2017   TRIG 190.0 (H) 05/12/2018   CHOLHDL 4 05/12/2018             Relevant Medications   telmisartan-hydrochlorothiazide (MICARDIS HCT) 80-25 MG tablet   Hypertension    Given her history of hypertensive CVA, goal BP is < 120/70.  She as been out of losartan for one week.   Prescribing telmisartan hct instead .  Continue amlodipine    Lab Results  Component Value Date   CREATININE 0.82 08/11/2018   Lab Results  Component Value Date   NA 141 08/11/2018   K 4.4 08/11/2018   CL 105 08/11/2018   CO2 26 08/11/2018   Lab Results  Component Value Date   MICROALBUR <0.7 05/12/2018        Relevant Medications   telmisartan-hydrochlorothiazide (MICARDIS HCT) 80-25 MG tablet   Hypothyroidism    Thyroid function is WNL on current dose.  No current changes needed.   Lab Results  Component Value Date   TSH 2.42 05/12/2018         Relevant Medications   levothyroxine (SYNTHROID, LEVOTHROID) 88 MCG tablet   Major depressive disorder, single episode    Her mood has improved.  She is attending community adult water aerobics  Classes and has joined a ukelele group at her church       Type 2 diabetes mellitus without complications ([place])    Improved a1d.  Fasting glucose was 110 ,  a1c today  Hasincreased from 5.8 fto  6.0 previously , attributed to exercise,   .  Encouraged to continue daily exercise and follow low GI diet. .  There is a strong  FH of type 2 DM.  Lab Results  Component Value Date   HGBA1C 6.0 08/11/2018   Lab Results  Component Value Date   MICROALBUR <0.7 05/12/2018          Relevant Medications   telmisartan-hydrochlorothiazide (MICARDIS HCT) 80-25 MG tablet    Other Visit Diagnoses    Diabetes mellitus without complication ([place])    -  Primary   Relevant Medications   telmisartan-hydrochlorothiazide (MICARDIS HCT) 80-25 MG tablet   Other Relevant Orders   Hemoglobin A1c (Completed)   Comprehensive metabolic panel (Completed)   Needs flu shot       Relevant Orders  Flu vaccine HIGH DOSE PF (Fluzone High dose) (Completed)      I have discontinued [place]. [place]'s ranitidine. I have also changed her omeprazole and levothyroxine. Additionally, I am having her start on telmisartan-hydrochlorothiazide. Lastly, I am having her maintain her cholecalciferol, aspirin, multivitamin, amLODipine, atorvastatin, and losartan-hydrochlorothiazide.  Meds ordered this encounter  Medications  . telmisartan-hydrochlorothiazide (MICARDIS HCT) 80-25 MG tablet    Sig: Take 1 tablet by mouth daily.    Dispense:  30 tablet    Refill:  5  . omeprazole (PRILOSEC OTC) 20 MG tablet    Sig: Take 1 tablet (20 mg total) by mouth daily.    Dispense:  90 tablet    Refill:  1  . levothyroxine (SYNTHROID, LEVOTHROID) 88 MCG tablet    Sig: Take 1 tablet (88 mcg total) by mouth daily before breakfast.    Dispense:  90 tablet    Refill:  0    Medications Discontinued During This Encounter  Medication Reason  . ranitidine (ZANTAC) 75 MG tablet   . omeprazole (PRILOSEC OTC) 20 MG tablet Reorder  . levothyroxine (SYNTHROID, LEVOTHROID) 88 MCG tablet Reorder    Follow-up: Return in about 6 months (around 02/09/2019) for follow up diabetes.   [name], MD

## 2018-08-11 NOTE — Assessment & Plan Note (Signed)
Improved a1d.  Fasting glucose was 110 ,  a1c today  Hasincreased from 5.8 fto  6.0 previously , attributed to exercise,   .  Encouraged to continue daily exercise and follow low GI diet. .  There is a strong  FH of type 2 DM.  Lab Results  Component Value Date   HGBA1C 6.0 08/11/2018   Lab Results  Component Value Date   MICROALBUR <0.7 05/12/2018

## 2018-08-11 NOTE — Assessment & Plan Note (Signed)
Thyroid function is WNL on current dose.  No current changes needed.   Lab Results  Component Value Date   TSH 2.42 05/12/2018

## 2018-08-11 NOTE — Assessment & Plan Note (Signed)
Tolerating potent dose statin therapy and direct LDL ar or near 100 on maximal dose of atorvastatin  .   Liver enzymes are normal , no changes today. Elevated trigs addressed with diet and exercise.   Lab Results  Component Value Date   ALT 21 08/11/2018   AST 23 08/11/2018   ALKPHOS 102 08/11/2018   BILITOT 0.6 08/11/2018   Lab Results  Component Value Date   CHOL 172 05/12/2018   HDL 43.40 05/12/2018   LDLCALC 91 05/12/2018   LDLDIRECT 114.0 08/05/2017   TRIG 190.0 (H) 05/12/2018   CHOLHDL 4 05/12/2018

## 2018-08-11 NOTE — Patient Instructions (Addendum)
We discussed my concern about using omeprazole long term  in light of the recently published studies suggesting an association with increased risk of dementia and kidney failure.  I advised  you to try using  famotidine 20 mg  twice daily, and I sent  an rx to your pharmacy.    if your reflux symptoms are controlled,  You can Continue the daily h2 blocker. If not,  We will resume a covered PPI   Go get a diabetic eye exam.  You are due annually  Your annual mammogram has been ordered.  You are encouraged (required) to call to make your appointment at [facility]   I will initiate the order for your COLON CANCER screening  Test.  It is called  Cologuard.  It will be delivered to your house, and you will send off a stool sample in the envelope it provides.    RETURN FOR PNEUMONIA VACCINE IN ONE WEEK   YOU RECEIVED THE FLU VACCINE TODAY   Diabetes Mellitus and Standards of Medical Care Managing diabetes (diabetes mellitus) can be complicated. Your diabetes treatment may be managed by a team of health care providers, including:  A physician who specializes in diabetes (endocrinologist).  A nurse practitioner or physician assistant.  Nurses.  A diet and nutrition specialist (registered dietitian).  A certified diabetes educator (CDE).  An exercise specialist.  A pharmacist.  An eye doctor.  A foot specialist (podiatrist).  A dentist.  A primary care provider.  A mental health provider. Your health care providers follow guidelines to help you get the best quality of care. The following schedule is a general guideline for your diabetes management plan. Your health care providers may give you more specific instructions. Physical exams Upon being diagnosed with diabetes mellitus, and each year after that, your health care provider will ask about your medical and family history. He or she will also do a physical exam. Your exam may include:  Measuring your height, weight,  and body mass index (BMI).  Checking your blood pressure. This will be done at every routine medical visit. Your target blood pressure may vary depending on your medical conditions, your age, and other factors.  Thyroid gland exam.  Skin exam.  Screening for damage to your nerves (peripheral neuropathy). This may include checking the pulse in your legs and feet and checking the level of sensation in your hands and feet.  A complete foot exam to inspect the structure and skin of your feet, including checking for cuts, bruises, redness, blisters, sores, or other problems.  Screening for blood vessel (vascular) problems, which may include checking the pulse in your legs and feet and checking your temperature. Blood tests Depending on your treatment plan and your personal needs, you may have the following tests done:  HbA1c (hemoglobin A1c). This test provides information about blood sugar (glucose) control over the previous 2-3 months. It is used to adjust your treatment plan, if needed. This test will be done: ? At least 2 times a year, if you are meeting your treatment goals. ? 4 times a year, if you are not meeting your treatment goals or if treatment goals have changed.  Lipid testing, including total, LDL, and HDL cholesterol and triglyceride levels. ? The goal for LDL is less than 100 mg/dL (5.5 mmol/L). If you are at high risk for complications, the goal is less than 70 mg/dL (3.9 mmol/L). ? The goal for HDL is 40 mg/dL (2.2 mmol/L) or  higher for men and 50 mg/dL (2.8 mmol/L) or higher for women. An HDL cholesterol of 60 mg/dL (3.3 mmol/L) or higher gives some protection against heart disease. ? The goal for triglycerides is less than 150 mg/dL (8.3 mmol/L).  Liver function tests.  Kidney function tests.  Thyroid function tests. Dental and eye exams  Visit your dentist two times a year.  If you have type 1 diabetes, your health care provider may recommend an eye exam 3-5 years  after you are diagnosed, and then once a year after your first exam. ? For children with type 1 diabetes, a health care provider may recommend an eye exam when your child is age 60 or older and has had diabetes for 3-5 years. After the first exam, your child should get an eye exam once a year.  If you have type 2 diabetes, your health care provider may recommend an eye exam as soon as you are diagnosed, and then once a year after your first exam. Immunizations   The yearly flu (influenza) vaccine is recommended for everyone 6 months or older who has diabetes.  The pneumonia (pneumococcal) vaccine is recommended for everyone 2 years or older who has diabetes. If you are 56 or older, you may get the pneumonia vaccine as a series of two separate shots.  The hepatitis B vaccine is recommended for adults shortly after being diagnosed with diabetes.  Adults and children with diabetes should receive all other vaccines according to age-specific recommendations from the Centers for Disease Control and Prevention (CDC). Mental and emotional health Screening for symptoms of eating disorders, anxiety, and depression is recommended at the time of diagnosis and afterward as needed. If your screening shows that you have symptoms (positive screening result), you may need more evaluation and you may work with a mental health care provider. Treatment plan Your treatment plan will be reviewed at every medical visit. You and your health care provider will discuss:  How you are taking your medicines, including insulin.  Any side effects you are experiencing.  Your blood glucose target goals.  The frequency of your blood glucose monitoring.  Lifestyle habits, such as activity level as well as tobacco, alcohol, and substance use. Diabetes self-management education Your health care provider will assess how well you are monitoring your blood glucose levels and whether you are taking your insulin correctly. He or  she may refer you to:  A certified diabetes educator to manage your diabetes throughout your life, starting at diagnosis.  A registered dietitian who can create or review your personal nutrition plan.  An exercise specialist who can discuss your activity level and exercise plan. Summary  Managing diabetes (diabetes mellitus) can be complicated. Your diabetes treatment may be managed by a team of health care providers.  Your health care providers follow guidelines in order to help you get the best quality of care.  Standards of care including having regular physical exams, blood tests, blood pressure monitoring, immunizations, screening tests, and education about how to manage your diabetes.  Your health care providers may also give you more specific instructions based on your individual health. This information is not intended to replace advice given to you by your health care provider. Make sure you discuss any questions you have with your health care provider. Document Released: 05/24/2009 Document Revised: 04/15/2018 Document Reviewed: 04/24/2016 Elsevier Interactive Patient Education  2019 [employer].

## 2018-08-15 MED ORDER — TELMISARTAN-HCTZ 80-25 MG PO TABS: 1.0000 | ORAL_TABLET | Freq: Every day | ORAL | 1 refills | Status: DC

## 2018-08-16 ENCOUNTER — Telehealth: Payer: Self-pay | Admitting: Internal Medicine

## 2018-08-16 NOTE — Telephone Encounter (Signed)
Copied from [place] [phone]. Topic: Quick Communication - See Telephone Encounter >> Aug 16, 2018  4:01 PM Antonieta Iba C wrote: CRM for notification. See Telephone encounter for: 08/16/18.  [place] called in to be advised. Pt Losartan --HTZ has been on back order so PCP changed pt's Rx to telmisartan-hydrochlorothiazide (MICARDIS HCT) 80-25 MG tablet which is requiring a PA. Pharmacy would like to know if they could just split the pt's original Rx for Losartan HTZ and make it 2 pills   Please advise.

## 2018-08-17 MED ORDER — LOSARTAN POTASSIUM-HCTZ 50-12.5 MG PO TABS: 2.0000 | ORAL_TABLET | Freq: Every day | ORAL | 3 refills | Status: DC

## 2018-08-17 NOTE — Telephone Encounter (Signed)
Yes, I have changed it to the previous med at lower dose.  2 pills daily

## 2018-08-17 NOTE — Telephone Encounter (Signed)
Never received a PA request for the telmisartan-hctz but the pharmacy is wanting to know if they can do the losartan-hctz that she was on and just split it into two different pills?

## 2018-08-18 ENCOUNTER — Ambulatory Visit (INDEPENDENT_AMBULATORY_CARE_PROVIDER_SITE_OTHER): Payer: Medicare Other

## 2018-08-18 ENCOUNTER — Telehealth: Payer: Self-pay

## 2018-08-18 DIAGNOSIS — Z23 Encounter for immunization: Secondary | ICD-10-CM | POA: Diagnosis not present

## 2018-08-18 MED ORDER — LOSARTAN POTASSIUM 100 MG PO TABS: 100.0000 mg | ORAL_TABLET | Freq: Every day | ORAL | 3 refills | Status: DC

## 2018-08-18 MED ORDER — HYDROCHLOROTHIAZIDE 25 MG PO TABS: 25.0000 mg | ORAL_TABLET | Freq: Every day | ORAL | 3 refills | Status: DC

## 2018-08-18 NOTE — Telephone Encounter (Signed)
Spoke with pharmacy and they stated that all doses of the combo losartan-hctz is on back order. It looks like you had lowered the dose of her losartan-hctz and having her take two tablets daily. Is it okay to split it into two different pills and and send it in as losartan 100mg  and hctz 25mg ?

## 2018-08-18 NOTE — Telephone Encounter (Signed)
Yes   I have resent as  losartan 100 mg and hct 25 mg

## 2018-08-18 NOTE — Telephone Encounter (Signed)
Pt was seen today and was upset she has been without her BP medication and with her Hx with having a stroke she is concern.   Pt stated that she was on a combination pill called Losartan-HCTZ which is on back order.   New BP medication was sent but pt's insurance will not cover. Pt would like to have the Losartan and HCTZ sent in separately today.   Please call patient once done.   Sent to [facility]

## 2018-08-18 NOTE — Telephone Encounter (Signed)
Spoke with pt to let her know that the losartan 100mg  and the hctz 25mg  have been sent to pharmacy as separate pills. Pt gave a verbal understanding and stated that if she has any trouble with getting it filled she will give our office a call back.

## 2018-08-18 NOTE — Progress Notes (Signed)
Pt was here today for PSV-23 given IM in the LD. Pt tolerated well.

## 2018-08-18 NOTE — Telephone Encounter (Signed)
Please see previous message

## 2018-09-02 DIAGNOSIS — Z1211 Encounter for screening for malignant neoplasm of colon: Secondary | ICD-10-CM | POA: Diagnosis not present

## 2018-09-02 LAB — COLOGUARD: Cologuard: NEGATIVE

## 2018-10-03 ENCOUNTER — Ambulatory Visit
Admission: RE | Admit: 2018-10-03 | Discharge: 2018-10-03 | Disposition: A | Discharge status: Home / Self Care | Payer: Medicare Other | Source: Ambulatory Visit | Attending: Internal Medicine | Admitting: Internal Medicine

## 2018-10-03 DIAGNOSIS — Z1239 Encounter for other screening for malignant neoplasm of breast: Secondary | ICD-10-CM

## 2018-10-03 DIAGNOSIS — Z1231 Encounter for screening mammogram for malignant neoplasm of breast: Secondary | ICD-10-CM | POA: Diagnosis not present

## 2018-10-06 DIAGNOSIS — H2513 Age-related nuclear cataract, bilateral: Secondary | ICD-10-CM | POA: Diagnosis not present

## 2018-10-06 LAB — HM DIABETES EYE EXAM

## 2018-10-26 ENCOUNTER — Other Ambulatory Visit: Payer: Self-pay | Admitting: Internal Medicine

## 2019-01-05 ENCOUNTER — Other Ambulatory Visit: Payer: Self-pay | Admitting: Internal Medicine

## 2019-01-24 ENCOUNTER — Other Ambulatory Visit: Payer: Self-pay | Admitting: Internal Medicine

## 2019-02-02 ENCOUNTER — Other Ambulatory Visit: Payer: Self-pay | Admitting: Internal Medicine

## 2019-02-02 DIAGNOSIS — E034 Atrophy of thyroid (acquired): Secondary | ICD-10-CM

## 2019-02-06 ENCOUNTER — Ambulatory Visit: Payer: PRIVATE HEALTH INSURANCE

## 2019-02-06 ENCOUNTER — Ambulatory Visit: Payer: PRIVATE HEALTH INSURANCE | Admitting: Internal Medicine

## 2019-02-21 ENCOUNTER — Other Ambulatory Visit: Payer: Self-pay

## 2019-02-21 ENCOUNTER — Ambulatory Visit (INDEPENDENT_AMBULATORY_CARE_PROVIDER_SITE_OTHER): Payer: Medicare Other

## 2019-02-21 ENCOUNTER — Encounter: Payer: Self-pay | Admitting: Internal Medicine

## 2019-02-21 ENCOUNTER — Ambulatory Visit (INDEPENDENT_AMBULATORY_CARE_PROVIDER_SITE_OTHER): Payer: Medicare Other | Admitting: Internal Medicine

## 2019-02-21 DIAGNOSIS — E034 Atrophy of thyroid (acquired): Secondary | ICD-10-CM

## 2019-02-21 DIAGNOSIS — E782 Mixed hyperlipidemia: Secondary | ICD-10-CM | POA: Diagnosis not present

## 2019-02-21 DIAGNOSIS — E119 Type 2 diabetes mellitus without complications: Secondary | ICD-10-CM | POA: Diagnosis not present

## 2019-02-21 DIAGNOSIS — Z0000 Encounter for general adult medical examination without abnormal findings: Secondary | ICD-10-CM | POA: Diagnosis not present

## 2019-02-21 DIAGNOSIS — Z716 Tobacco abuse counseling: Secondary | ICD-10-CM

## 2019-02-21 DIAGNOSIS — I10 Essential (primary) hypertension: Secondary | ICD-10-CM

## 2019-02-21 MED ORDER — CHANTIX STARTING MONTH PAK 0.5 MG X 11 & 1 MG X 42 PO TABS: ORAL_TABLET | ORAL | 0 refills | Status: DC

## 2019-02-21 NOTE — Progress Notes (Addendum)
Subjective:   Alexandra Copeland is a 73 y.o. female who presents for Medicare Annual (Subsequent) preventive examination.  Review of Systems:  No ROS.  Medicare Wellness Virtual Visit.  Visual/audio telehealth visit, UTA vital signs.   See social history for additional risk factors.   Cardiac Risk Factors include: advanced age (>67men, >31 women);hypertension;diabetes mellitus     Objective:     Vitals: There were no vitals taken for this visit.  There is no height or weight on file to calculate BMI.  Advanced Directives 02/21/2019 12/16/2017 12/10/2016 12/11/2015  Does Patient Have a Medical Advance Directive? Yes Yes Yes Yes  Type of Paramedic of [place];Living will [place];Living will [place];Living will Living will  Does patient want to make changes to medical advance directive? No - Patient declined No - Patient declined No - Patient declined -  Copy of [place] in Chart? No - copy requested No - copy requested No - copy requested No - copy requested    Tobacco Social History   Tobacco Use  Smoking Status Former Smoker  . Packs/day: 1.00  . Types: Cigarettes  . Quit date: 08/11/2017  . Years since quitting: 1.5  Smokeless Tobacco Never Used     Counseling given: Not Answered   Clinical Intake:  Pre-visit preparation completed: Yes        Diabetes: Yes(Followed by pcp)  How often do you need to have someone help you when you read instructions, pamphlets, or other written materials from your doctor or pharmacy?: 1 - Never  Interpreter Needed?: No     Past Medical History:  Diagnosis Date  . Hyperlipidemia   . Hypertension   . Hypothyroid   . Scarlet fever    as a child  . Stroke [facility]) June 2010   left parietal hypertensive   Past Surgical History:  Procedure Laterality Date  . BREAST CYST ASPIRATION Left 1978   neg  . BREAST SURGERY  1975   abscess drained    Family History  Problem Relation Age of Onset  . Heart disease Mother   . Hypertension Mother   . Stroke Father   . Hyperlipidemia Father   . Vision loss Father   . Hypertension Brother   . Diabetes Paternal Grandmother   . Breast cancer Neg Hx    Social History   Socioeconomic History  . Marital status: Single    Spouse name: Not on file  . Number of children: Not on file  . Years of education: Not on file  . Highest education level: Not on file  Occupational History  . Not on file  Social Needs  . Financial resource strain: Not hard at all  . Food insecurity    Worry: Never true    Inability: Never true  . Transportation needs    Medical: No    Non-medical: No  Tobacco Use  . Smoking status: Former Smoker    Packs/day: 1.00    Types: Cigarettes    Quit date: 08/11/2017    Years since quitting: 1.5  . Smokeless tobacco: Never Used  Substance and Sexual Activity  . Alcohol use: Yes    Alcohol/week: 1.0 standard drinks    Types: 1 Glasses of wine per week    Comment: OCC glass of wine  . Drug use: No  . Sexual activity: Never  Lifestyle  . Physical activity    Days per week: 3 days  Minutes per session: 60 min  . Stress: Not at all  Relationships  . Social Herbalist on phone: Not on file    Gets together: Not on file    Attends religious service: Not on file    Active member of club or organization: Not on file    Attends meetings of clubs or organizations: Not on file    Relationship status: Not on file  Other Topics Concern  . Not on file  Social History Narrative  . Not on file    Outpatient Encounter Medications as of 02/21/2019  Medication Sig  . amLODipine (NORVASC) 2.5 MG tablet Take 1 tablet by mouth once daily  . APPLE CIDER VINEGAR PO Take 1 capsule by mouth daily.  Marland Kitchen aspirin 325 MG tablet Take 325 mg by mouth daily.  Marland Kitchen atorvastatin (LIPITOR) 80 MG tablet Take 1 tablet by mouth once daily  . cholecalciferol (VITAMIN D) 1000 UNITS  tablet Take 1,000 Units by mouth daily.  . hydrochlorothiazide (HYDRODIURIL) 25 MG tablet Take 1 tablet (25 mg total) by mouth daily.  Marland Kitchen levothyroxine (SYNTHROID) 88 MCG tablet TAKE 1 TABLET BY MOUTH ONCE DAILY BEFORE BREAKFAST  . losartan (COZAAR) 100 MG tablet Take 1 tablet (100 mg total) by mouth daily.  . Multiple Vitamin (MULTIVITAMIN) tablet Take 1 tablet by mouth daily.  Marland Kitchen omeprazole (PRILOSEC OTC) 20 MG tablet Take 1 tablet (20 mg total) by mouth daily.   No facility-administered encounter medications on file as of 02/21/2019.     Activities of Daily Living In your present state of health, do you have any difficulty performing the following activities: 02/21/2019  Hearing? N  Vision? N  Difficulty concentrating or making decisions? N  Walking or climbing stairs? N  Dressing or bathing? N  Doing errands, shopping? N  Preparing Food and eating ? N  Using the Toilet? N  In the past six months, have you accidently leaked urine? N  Do you have problems with loss of bowel control? N  Managing your Medications? N  Managing your Finances? N  Housekeeping or managing your Housekeeping? N  Some recent data might be hidden    Patient Care Team: [name], MD as PCP - General (Internal Medicine)    Assessment:   This is a routine wellness examination for [place].  I connected with patient 02/21/19 at 12:30 PM EDT by a video/audio enabled telemedicine application and verified that I am speaking with the correct person using two identifiers. Patient stated full name and DOB. Patient gave permission to continue with virtual visit. Patient's location was at home and Nurse's location was at [place] office.   Health Screenings  Mammogram - 09/2018 Cologuard- 09/02/18; negative  Bone Density - 03/2016 Glaucoma -none Hearing -demonstrates normal hearing during visit. Hemoglobin A1C - 08/2018; 6.0 Cholesterol - 05/2018 Dental- visits every 6 months Vision- visits within the last 12  months.  Social  Alcohol intake - yes      Smoking history- current Smokers in home? Self- ready to quit. Deferred to pcp same day appointment.  Illicit drug use? none Exercise - treadmill 3 days weekly, 60 minutes Diet - low carb Sexually Active -not currently BMI- discussed the importance of a healthy diet, water intake and the benefits of aerobic exercise.  Educational material provided.   Safety  Patient feels safe at home- yes Patient does have smoke detectors at home- yes Patient does wear sunscreen or protective clothing when in direct sunlight -  yes Patient does wear seat belt when in a moving vehicle -yes Handrails on stairs- yes Hallways free of loose throw rugs, electrical cords etc- yes  Covid-19 precautions and sickness symptoms discussed.   Activities of Daily Living Patient denies needing assistance with: driving, household chores, feeding themselves, getting from bed to chair, getting to the toilet, bathing/showering, dressing, managing money, or preparing meals.  No new identified risk were noted.    Depression Screen Patient denies losing interest in daily life, feeling hopeless, or crying easily over simple problems.   Medication-taking as directed and without issues.   Fall Screen Patient denies being afraid of falling or falling in the last year.   Memory Screen Patient is alert.  Patient denies difficulty focusing, concentrating or misplacing items. Correctly identified the president of the [place] , season and recall. Patient likes to play solitaire and computer games for brain stimulation.  Immunizations The following Immunizations were discussed: Influenza, shingles, pneumonia, and tetanus.   Other Providers Patient Care Team: [name], MD as PCP - General (Internal Medicine)  Exercise Activities and Dietary recommendations Current Exercise Habits: Home exercise routine, Type of exercise: treadmill, Time (Minutes): 60, Frequency (Times/Week):  3, Weekly Exercise (Minutes/Week): 180  Goals      Patient Stated   . Quit Smoking (pt-stated)     Follow up with pcp       Fall Risk Fall Risk  02/21/2019 12/16/2017 12/10/2016 12/11/2015 02/05/2015  Falls in the past year? 0 No No No No     Depression Screen PHQ 2/9 Scores 02/21/2019 12/16/2017 12/10/2016 12/11/2015  PHQ - 2 Score 0 0 0 0  PHQ- 9 Score - - 0 -     Cognitive Function MMSE - Mini Mental State Exam 12/16/2017 12/10/2016 12/11/2015  Orientation to time 5 5 5   Orientation to Place 5 5 5   Registration 3 3 3   Attention/ Calculation 5 5 5   Recall 3 3 3   Language- name 2 objects 2 2 2   Language- repeat 1 1 1   Language- follow 3 step command 3 3 3   Language- read & follow direction 1 1 1   Write a sentence 1 1 1   Copy design 1 1 1   Total score 30 30 30      6CIT Screen 02/21/2019  What Year? 0 points  What month? 0 points  What time? 0 points  Count back from 20 0 points  Months in reverse 0 points  Repeat phrase 0 points  Total Score 0    Immunization History  Administered Date(s) Administered  . Influenza, High Dose Seasonal PF 08/11/2018  . Influenza,inj,Quad PF,6+ Mos 05/24/2013, 04/30/2014  . PPD Test 09/18/2014  . Pneumococcal Conjugate-13 05/28/2014  . Pneumococcal Polysaccharide-23 05/29/2010, 08/18/2018  . Tdap 05/28/2010   Screening Tests Health Maintenance  Topic Date Due  . HEMOGLOBIN A1C  02/09/2019  . INFLUENZA VACCINE  03/11/2019  . FOOT EXAM  08/12/2019  . MAMMOGRAM  10/04/2019  . OPHTHALMOLOGY EXAM  10/07/2019  . TETANUS/TDAP  05/28/2020  . Fecal DNA (Cologuard)  09/02/2021  . DEXA SCAN  Completed  . Hepatitis C Screening  Completed  . PNA vac Low Risk Adult  Completed      Plan:    End of life planning; Advanced aging; Advanced directives discussed.  No HCPOA/Living Will.  Additional information declined at this time.  Follow up with pcp to assist with quit smoking.   Spot check BP at home today and provide readings to pcp during  same  day visit.   I have personally reviewed and noted the following in the patient's chart:   . Medical and social history . Use of alcohol, tobacco or illicit drugs  . Current medications and supplements . Functional ability and status . Nutritional status . Physical activity . Advanced directives . List of other physicians . Hospitalizations, surgeries, and ER visits in previous 12 months . Vitals . Screenings to include cognitive, depression, and falls . Referrals and appointments  In addition, I have reviewed and discussed with patient certain preventive protocols, quality metrics, and best practice recommendations. A written personalized care plan for preventive services as well as general preventive health recommendations were provided to patient.     OBrien-Blaney, Angellee Cohill L, LPN  3/41/[id number]    I have reviewed the above information and agree with above.   Deborra Medina, MD

## 2019-02-21 NOTE — Progress Notes (Signed)
Telephone Note  This visit type was conducted due to national recommendations for restrictions regarding the COVID-19 pandemic (e.g. social distancing).  This format is felt to be most appropriate for this patient at this time.  All issues noted in this document were discussed and addressed.  No physical exam was performed (except for noted visual exam findings with Video Visits).   I connected with@ on 02/21/19 at  4:00 PM EDT by  telephone and verified that I am speaking with the correct person using two identifiers. Location patient: home Location provider: work or home office Persons participating in the virtual visit: patient, provider  I discussed the limitations, risks, security and privacy concerns of performing an evaluation and management service by telephone and the availability of in person appointments. I also discussed with the patient that there may be a patient responsible charge related to this service. The patient expressed understanding and agreed to proceed.  Reason for visit: follow up  HPI:  The patient has no signs or symptoms of COVID 19 infection (fever, cough, sore throat  or shortness of breath beyond what is typical for patient).  Patient denies contact with other persons with the above mentioned symptoms or with anyone confirmed to have COVID 19. .   She is enjoying life in solitude,  Gardening and doing yard work . Lost ten lbs,  And then regained some.   T2DM:  Not checking sugars.  Following a low glycemic index diet  75% of the time.   Tobacco abuse: quit for 10  months after one month of  chantix. But started back in October and , the second  month of chantix costing $400.  Smokes in the morning with her the coffee     Hypertension: patient checks blood pressure twice weekly at home.  Readings have been for the most part < 140/80 at rest . Patient is following a reduce salt diet most days and is taking medications as prescribed.  Morning readings after coffee  are elevated to 160/80 . Nighttime readings 127/75   ROS: See pertinent positives and negatives per HPI.  Past Medical History:  Diagnosis Date  . Hyperlipidemia   . Hypertension   . Hypothyroid   . Scarlet fever    as a child  . Stroke [facility]) June 2010   left parietal hypertensive    Past Surgical History:  Procedure Laterality Date  . BREAST CYST ASPIRATION Left 1978   neg  . BREAST SURGERY  1975   abscess drained    Family History  Problem Relation Age of Onset  . Heart disease Mother   . Hypertension Mother   . Stroke Father   . Hyperlipidemia Father   . Vision loss Father   . Hypertension Brother   . Diabetes Paternal Grandmother   . Breast cancer Neg Hx     SOCIAL HX:  reports that she has been smoking cigarettes. She started smoking about 9 months ago. She has been smoking about 1.00 pack per day. She has never used smokeless tobacco. She reports current alcohol use of about 1.0 standard drinks of alcohol per week. She reports that she does not use drugs.   Current Outpatient Medications:  .  amLODipine (NORVASC) 2.5 MG tablet, Take 1 tablet by mouth once daily, Disp: 90 tablet, Rfl: 1 .  APPLE CIDER VINEGAR PO, Take 1 capsule by mouth daily., Disp: , Rfl:  .  aspirin 325 MG tablet, Take 325 mg by mouth daily., Disp: , Rfl:  .  atorvastatin (LIPITOR) 80 MG tablet, Take 1 tablet by mouth once daily, Disp: 90 tablet, Rfl: 0 .  cholecalciferol (VITAMIN D) 1000 UNITS tablet, Take 1,000 Units by mouth daily., Disp: , Rfl:  .  hydrochlorothiazide (HYDRODIURIL) 25 MG tablet, Take 1 tablet (25 mg total) by mouth daily., Disp: 90 tablet, Rfl: 3 .  levothyroxine (SYNTHROID) 88 MCG tablet, TAKE 1 TABLET BY MOUTH ONCE DAILY BEFORE BREAKFAST, Disp: 90 tablet, Rfl: 0 .  losartan (COZAAR) 100 MG tablet, Take 1 tablet (100 mg total) by mouth daily., Disp: 90 tablet, Rfl: 3 .  Multiple Vitamin (MULTIVITAMIN) tablet, Take 1 tablet by mouth daily., Disp: , Rfl:  .  omeprazole  (PRILOSEC OTC) 20 MG tablet, Take 1 tablet (20 mg total) by mouth daily., Disp: 90 tablet, Rfl: 1 .  varenicline (CHANTIX STARTING MONTH PAK) 0.5 MG X 11 & 1 MG X 42 tablet, Take one 0.5 mg tablet by mouth once daily for 3 days, then increase to one 0.5 mg tablet twice daily for 4 days, then increase to one 1 mg tablet twice daily., Disp: 53 tablet, Rfl: 0  EXAM:    General impression: alert, cooperative and articulate.  No signs of being in distress  Lungs: speech is fluent sentence length suggests that patient is not short of breath and not punctuated by cough, sneezing or sniffing. Marland Kitchen   Psych: affect normal.  speech is articulate and non pressured .  Denies suicidal thoughts   ASSESSMENT AND PLAN:   Type 2 diabetes mellitus without complications (HCC) Stable  a1c.  Fasting glucose was 110 ,  a1c today    6.0 .  Encouraged to continue daily exercise and follow low GI diet. .  There is a strong  FH of type 2 DM.  Lab Results  Component Value Date   HGBA1C 6.0 02/22/2019   Lab Results  Component Value Date   MICROALBUR 1.4 02/22/2019     Tobacco abuse counseling She has relapsed since November after ten months of abstinence due to [place] After the starting dose. She is requesting a starting pack again   Hypertension Lability reported,  Advised to take medications in the evening and report home readings in one week   Lab Results  Component Value Date   CREATININE 0.78 02/22/2019   Lab Results  Component Value Date   NA 141 02/22/2019   K 3.6 02/22/2019   CL 103 02/22/2019   CO2 28 02/22/2019   Lab Results  Component Value Date   MICROALBUR 1.4 02/22/2019     I discussed the assessment and treatment plan with the patient. The patient was provided an opportunity to ask questions and all were answered. The patient agreed with the plan and demonstrated an understanding of the instructions.   The patient was advised to call back or seek an  in-person evaluation if the symptoms worsen or if the condition fails to improve as anticipated.  I provided 25 minutes of non-face-to-face time during this encounter.   [name], MD

## 2019-02-21 NOTE — Patient Instructions (Addendum)
Change your losartan to evening dosing and continue taking the amlodipine and hctz in the morning   I have refilled the Chantix for one month

## 2019-02-21 NOTE — Patient Instructions (Addendum)
  Ms. Mayden , Thank you for taking time to come for your Medicare Wellness Visit. I appreciate your ongoing commitment to your health goals. Please review the following plan we discussed and let me know if I can assist you in the future.   These are the goals we discussed: Goals      Patient Stated   . Quit Smoking (pt-stated)     Follow up with pcp       This is a list of the screening recommended for you and due dates:  Health Maintenance  Topic Date Due  . Hemoglobin A1C  02/09/2019  . Flu Shot  03/11/2019  . Complete foot exam   08/12/2019  . Mammogram  10/04/2019  . Eye exam for diabetics  10/07/2019  . Tetanus Vaccine  05/28/2020  . Cologuard (Stool DNA test)  09/02/2021  . DEXA scan (bone density measurement)  Completed  .  Hepatitis C: One time screening is recommended by Center for Disease Control  (CDC) for  adults born from [birth year] through 1964.   Completed  . Pneumonia vaccines  Completed

## 2019-02-22 ENCOUNTER — Other Ambulatory Visit (INDEPENDENT_AMBULATORY_CARE_PROVIDER_SITE_OTHER): Payer: Medicare Other

## 2019-02-22 ENCOUNTER — Other Ambulatory Visit: Payer: Self-pay

## 2019-02-22 DIAGNOSIS — E782 Mixed hyperlipidemia: Secondary | ICD-10-CM | POA: Diagnosis not present

## 2019-02-22 DIAGNOSIS — E034 Atrophy of thyroid (acquired): Secondary | ICD-10-CM | POA: Diagnosis not present

## 2019-02-22 DIAGNOSIS — E119 Type 2 diabetes mellitus without complications: Secondary | ICD-10-CM | POA: Diagnosis not present

## 2019-02-22 LAB — LIPID PANEL
Cholesterol: 165 mg/dL (ref 0–200)
HDL: 41 mg/dL (ref >39.00)
LDL Cholesterol: 90 mg/dL (ref 0–99)
NonHDL: 124.08
Total CHOL/HDL Ratio: 4
Triglycerides: 172 mg/dL — ABNORMAL HIGH (ref 0.0–149.0)
VLDL: 34.4 mg/dL (ref 0.0–40.0)

## 2019-02-22 LAB — COMPREHENSIVE METABOLIC PANEL
ALT: 16 U/L (ref 0–35)
AST: 20 U/L (ref 0–37)
Albumin: 4.4 g/dL (ref 3.5–5.2)
Alkaline Phosphatase: 102 U/L (ref 39–117)
BUN: 16 mg/dL (ref 6–23)
CO2: 28 mEq/L (ref 19–32)
Calcium: 9.4 mg/dL (ref 8.4–10.5)
Chloride: 103 mEq/L (ref 96–112)
Creatinine, Ser: 0.78 mg/dL (ref 0.40–1.20)
GFR: 72.54 mL/min (ref >60.00)
Glucose, Bld: 108 mg/dL — ABNORMAL HIGH (ref 70–99)
Potassium: 3.6 mEq/L (ref 3.5–5.1)
Sodium: 141 mEq/L (ref 135–145)
Total Bilirubin: 0.6 mg/dL (ref 0.2–1.2)
Total Protein: 6.6 g/dL (ref 6.0–8.3)

## 2019-02-22 LAB — MICROALBUMIN / CREATININE URINE RATIO
Creatinine,U: 173 mg/dL
Microalb Creat Ratio: 0.8 mg/g (ref 0.0–30.0)
Microalb, Ur: 1.4 mg/dL (ref 0.0–1.9)

## 2019-02-22 LAB — TSH: TSH: 2.62 u[IU]/mL (ref 0.35–4.50)

## 2019-02-22 LAB — HEMOGLOBIN A1C: Hgb A1c MFr Bld: 6 % (ref 4.6–6.5)

## 2019-02-22 NOTE — Assessment & Plan Note (Signed)
She has relapsed since November after ten months of abstinence due to [place] After the starting dose. She is requesting a starting pack again

## 2019-02-22 NOTE — Assessment & Plan Note (Signed)
Stable  a1c.  Fasting glucose was 110 ,  a1c today    6.0 .  Encouraged to continue daily exercise and follow low GI diet. .  There is a strong  FH of type 2 DM.  Lab Results  Component Value Date   HGBA1C 6.0 02/22/2019   Lab Results  Component Value Date   MICROALBUR 1.4 02/22/2019

## 2019-02-22 NOTE — Assessment & Plan Note (Signed)
Lability reported,  Advised to take medications in the evening and report home readings in one week   Lab Results  Component Value Date   CREATININE 0.78 02/22/2019   Lab Results  Component Value Date   NA 141 02/22/2019   K 3.6 02/22/2019   CL 103 02/22/2019   CO2 28 02/22/2019   Lab Results  Component Value Date   MICROALBUR 1.4 02/22/2019

## 2019-02-23 ENCOUNTER — Ambulatory Visit: Payer: PRIVATE HEALTH INSURANCE | Admitting: Internal Medicine

## 2019-05-01 ENCOUNTER — Other Ambulatory Visit: Payer: Self-pay | Admitting: Internal Medicine

## 2019-05-01 DIAGNOSIS — E034 Atrophy of thyroid (acquired): Secondary | ICD-10-CM

## 2019-07-05 ENCOUNTER — Other Ambulatory Visit: Payer: Self-pay

## 2019-07-12 ENCOUNTER — Other Ambulatory Visit: Payer: Self-pay | Admitting: Internal Medicine

## 2019-08-09 ENCOUNTER — Other Ambulatory Visit: Payer: Self-pay | Admitting: Internal Medicine

## 2019-08-17 ENCOUNTER — Telehealth: Payer: Self-pay

## 2019-08-17 NOTE — Telephone Encounter (Signed)
error 

## 2019-08-18 ENCOUNTER — Telehealth: Payer: Self-pay

## 2019-08-18 NOTE — Telephone Encounter (Signed)
Verbal okay given to change the manufacturer of levothyroxine.   LMTCB. Need to schedule pt a lab appt in 6 weeks to recheck TSH.

## 2019-10-02 ENCOUNTER — Ambulatory Visit: Payer: Medicare Other | Attending: Internal Medicine

## 2019-10-02 DIAGNOSIS — Z23 Encounter for immunization: Secondary | ICD-10-CM | POA: Insufficient documentation

## 2019-10-02 NOTE — Progress Notes (Signed)
   Covid-19 Vaccination Clinic  Name:  Alexandra Copeland    MRN: [id number] DOB: 1945/09/14  10/02/2019  [name] was observed post Covid-19 immunization for 15 minutes without incidence. She was provided with Vaccine Information Sheet and instruction to access the V-Safe system.   [name] was instructed to call 911 with any severe reactions post vaccine: Marland Kitchen Difficulty breathing  . Swelling of your face and throat  . A fast heartbeat  . A bad rash all over your body  . Dizziness and weakness    Immunizations Administered    Name Date Dose VIS Date Route   Moderna COVID-19 Vaccine 10/02/2019  2:36 PM 0.5 mL 07/11/2019 Intramuscular   Manufacturer: Moderna   Lot: [id number]   [id number]

## 2019-10-31 ENCOUNTER — Ambulatory Visit: Payer: Medicare Other | Attending: Internal Medicine

## 2019-10-31 DIAGNOSIS — Z23 Encounter for immunization: Secondary | ICD-10-CM

## 2019-10-31 NOTE — Progress Notes (Signed)
   Covid-19 Vaccination Clinic  Name:  Alexandra Copeland    MRN: [id number] DOB: 01/20/1946  10/31/2019  Alexandra Copeland was observed post Covid-19 immunization for 15 minutes without incident. She was provided with Vaccine Information Sheet and instruction to access the V-Safe system.   Alexandra Copeland was instructed to call 911 with any severe reactions post vaccine: Marland Kitchen Difficulty breathing  . Swelling of face and throat  . A fast heartbeat  . A bad rash all over body  . Dizziness and weakness   Immunizations Administered    Name Date Dose VIS Date Route   Moderna COVID-19 Vaccine 10/31/2019 10:50 AM 0.5 mL 07/11/2019 Intramuscular   Manufacturer: Levan Hurst   LotKK:[id number]   [place]:[id number]

## 2019-11-22 ENCOUNTER — Other Ambulatory Visit: Payer: Self-pay | Admitting: Internal Medicine

## 2019-11-22 DIAGNOSIS — E034 Atrophy of thyroid (acquired): Secondary | ICD-10-CM

## 2020-01-03 DIAGNOSIS — H2513 Age-related nuclear cataract, bilateral: Secondary | ICD-10-CM | POA: Diagnosis not present

## 2020-01-03 LAB — HM DIABETES EYE EXAM

## 2020-01-08 ENCOUNTER — Other Ambulatory Visit: Payer: Self-pay | Admitting: Internal Medicine

## 2020-01-30 ENCOUNTER — Other Ambulatory Visit: Payer: Self-pay | Admitting: Internal Medicine

## 2020-02-13 ENCOUNTER — Other Ambulatory Visit: Payer: Self-pay

## 2020-02-13 ENCOUNTER — Encounter: Payer: Self-pay | Admitting: Ophthalmology

## 2020-02-16 ENCOUNTER — Other Ambulatory Visit: Payer: Self-pay | Admitting: Internal Medicine

## 2020-02-16 DIAGNOSIS — E034 Atrophy of thyroid (acquired): Secondary | ICD-10-CM

## 2020-02-19 NOTE — Discharge Instructions (Signed)

## 2020-02-20 ENCOUNTER — Encounter
Admission: RE | Disposition: A | Discharge status: Home / Self Care | Payer: Self-pay | Source: Home / Self Care | Attending: Ophthalmology

## 2020-02-20 ENCOUNTER — Ambulatory Visit
Admission: RE | Admit: 2020-02-20 | Discharge: 2020-02-20 | Disposition: A | Discharge status: Home / Self Care | Payer: Medicare Other | Attending: Ophthalmology | Admitting: Ophthalmology

## 2020-02-20 ENCOUNTER — Encounter: Payer: Self-pay | Admitting: Ophthalmology

## 2020-02-20 ENCOUNTER — Ambulatory Visit: Payer: Medicare Other | Admitting: Anesthesiology

## 2020-02-20 DIAGNOSIS — K219 Gastro-esophageal reflux disease without esophagitis: Secondary | ICD-10-CM | POA: Insufficient documentation

## 2020-02-20 DIAGNOSIS — E7800 Pure hypercholesterolemia, unspecified: Secondary | ICD-10-CM | POA: Diagnosis not present

## 2020-02-20 DIAGNOSIS — Z79890 Hormone replacement therapy (postmenopausal): Secondary | ICD-10-CM | POA: Insufficient documentation

## 2020-02-20 DIAGNOSIS — Z7982 Long term (current) use of aspirin: Secondary | ICD-10-CM | POA: Diagnosis not present

## 2020-02-20 DIAGNOSIS — F17200 Nicotine dependence, unspecified, uncomplicated: Secondary | ICD-10-CM | POA: Diagnosis not present

## 2020-02-20 DIAGNOSIS — Z8673 Personal history of transient ischemic attack (TIA), and cerebral infarction without residual deficits: Secondary | ICD-10-CM | POA: Insufficient documentation

## 2020-02-20 DIAGNOSIS — H25812 Combined forms of age-related cataract, left eye: Secondary | ICD-10-CM | POA: Diagnosis not present

## 2020-02-20 DIAGNOSIS — Z79899 Other long term (current) drug therapy: Secondary | ICD-10-CM | POA: Insufficient documentation

## 2020-02-20 DIAGNOSIS — I10 Essential (primary) hypertension: Secondary | ICD-10-CM | POA: Insufficient documentation

## 2020-02-20 DIAGNOSIS — E039 Hypothyroidism, unspecified: Secondary | ICD-10-CM | POA: Diagnosis not present

## 2020-02-20 DIAGNOSIS — H2512 Age-related nuclear cataract, left eye: Secondary | ICD-10-CM | POA: Insufficient documentation

## 2020-02-20 HISTORY — PX: CATARACT EXTRACTION W/PHACO: SHX586

## 2020-02-20 SURGERY — PHACOEMULSIFICATION, CATARACT, WITH IOL INSERTION
Anesthesia: Monitor Anesthesia Care | Site: Eye | Laterality: Left

## 2020-02-20 MED ORDER — FENTANYL CITRATE (PF) 100 MCG/2ML IJ SOLN
INTRAMUSCULAR | Status: DC | PRN
  Administered 2020-02-20 (×2): 50 ug via INTRAVENOUS

## 2020-02-20 MED ORDER — MIDAZOLAM HCL 2 MG/2ML IJ SOLN
INTRAMUSCULAR | Status: DC | PRN
  Administered 2020-02-20 (×2): 1 mg via INTRAVENOUS

## 2020-02-20 MED ORDER — ONDANSETRON HCL 4 MG/2ML IJ SOLN: 4.0000 mg | Freq: Once | INTRAMUSCULAR | Status: DC | PRN

## 2020-02-20 MED ORDER — MOXIFLOXACIN HCL 0.5 % OP SOLN
OPHTHALMIC | Status: DC | PRN
  Administered 2020-02-20: 0.2 mL via OPHTHALMIC

## 2020-02-20 MED ORDER — SODIUM HYALURONATE 23 MG/ML IO SOLN
INTRAOCULAR | Status: DC | PRN
  Administered 2020-02-20: 0.6 mL via INTRAOCULAR

## 2020-02-20 MED ORDER — SODIUM HYALURONATE 10 MG/ML IO SOLN
INTRAOCULAR | Status: DC | PRN
  Administered 2020-02-20: 0.55 mL via INTRAOCULAR

## 2020-02-20 MED ORDER — ACETAMINOPHEN 160 MG/5ML PO SOLN: 325.0000 mg | ORAL | Status: DC | PRN

## 2020-02-20 MED ORDER — TETRACAINE HCL 0.5 % OP SOLN
1.0000 [drp] | OPHTHALMIC | Status: DC | PRN
  Administered 2020-02-20 (×3): 1 [drp] via OPHTHALMIC

## 2020-02-20 MED ORDER — LIDOCAINE HCL (PF) 2 % IJ SOLN: INTRAOCULAR | Status: DC | PRN

## 2020-02-20 MED ORDER — ACETAMINOPHEN 325 MG PO TABS: 325.0000 mg | ORAL_TABLET | ORAL | Status: DC | PRN

## 2020-02-20 MED ORDER — EPINEPHRINE PF 1 MG/ML IJ SOLN
INTRAOCULAR | Status: DC | PRN
  Administered 2020-02-20: 66 mL via OPHTHALMIC

## 2020-02-20 MED ORDER — ARMC OPHTHALMIC DILATING DROPS
1.0000 "application " | OPHTHALMIC | Status: DC | PRN
  Administered 2020-02-20 (×3): 1 via OPHTHALMIC

## 2020-02-20 SURGICAL SUPPLY — 20 items
CANNULA ANT/CHMB 27G (MISCELLANEOUS) ×2 IMPLANT
CANNULA ANT/CHMB 27GA (MISCELLANEOUS) ×6 IMPLANT
DISSECTOR HYDRO NUCLEUS 50X22 (MISCELLANEOUS) ×3 IMPLANT
GLOVE SURG LX 7.5 STRW (GLOVE) ×2
GLOVE SURG LX STRL 7.5 STRW (GLOVE) ×1 IMPLANT
GLOVE SURG SYN 8.5  E (GLOVE) ×2
GLOVE SURG SYN 8.5 E (GLOVE) ×1 IMPLANT
GLOVE SURG SYN 8.5 PF PI (GLOVE) ×1 IMPLANT
GOWN STRL REUS W/ TWL LRG LVL3 (GOWN DISPOSABLE) ×2 IMPLANT
GOWN STRL REUS W/TWL LRG LVL3 (GOWN DISPOSABLE) ×4
LENS IOL DIOP 23.5 (Intraocular Lens) ×3 IMPLANT
LENS IOL TECNIS MONO 23.5 (Intraocular Lens) IMPLANT
MARKER SKIN DUAL TIP RULER LAB (MISCELLANEOUS) ×3 IMPLANT
PACK DR. KING ARMS (PACKS) ×3 IMPLANT
PACK EYE AFTER SURG (MISCELLANEOUS) ×3 IMPLANT
PACK OPTHALMIC (MISCELLANEOUS) ×3 IMPLANT
SYR 3ML LL SCALE MARK (SYRINGE) ×3 IMPLANT
SYR TB 1ML LUER SLIP (SYRINGE) ×3 IMPLANT
WATER STERILE IRR 250ML POUR (IV SOLUTION) ×3 IMPLANT
WIPE NON LINTING 3.25X3.25 (MISCELLANEOUS) ×3 IMPLANT

## 2020-02-20 NOTE — Anesthesia Postprocedure Evaluation (Signed)
Anesthesia Post Note  Patient: Alexandra Copeland  Procedure(s) Performed: CATARACT EXTRACTION PHACO AND INTRAOCULAR LENS PLACEMENT (IOC) LEFT DIABETIC 6.77 00:41.8 (Left Eye)     Patient location during evaluation: PACU Anesthesia Type: MAC Level of consciousness: awake and alert Pain management: pain level controlled Vital Signs Assessment: post-procedure vital signs reviewed and stable Respiratory status: spontaneous breathing, nonlabored ventilation, respiratory function stable and patient connected to nasal cannula oxygen Cardiovascular status: stable and blood pressure returned to baseline Postop Assessment: no apparent nausea or vomiting Anesthetic complications: no   No complications documented.  Alisa Graff

## 2020-02-20 NOTE — Op Note (Signed)
OPERATIVE NOTE  Alexandra Copeland [id number] 02/20/2020   PREOPERATIVE DIAGNOSIS:  Nuclear sclerotic cataract left eye.  H25.12   POSTOPERATIVE DIAGNOSIS:    Nuclear sclerotic cataract left eye.     PROCEDURE:  Phacoemusification with posterior chamber intraocular lens placement of the left eye   LENS:   Implant Name Type Inv. Item Serial No. Manufacturer Lot No. LRB No. Used Action  LENS IOL DIOP 23.5 - [id number] Intraocular Lens LENS IOL DIOP 23.5 [id number] [employer]  Left 1 Implanted      Procedure(s): CATARACT EXTRACTION PHACO AND INTRAOCULAR LENS PLACEMENT (IOC) LEFT DIABETIC 6.77 00:41.8 (Left)  DCB00 +23.5   ULTRASOUND TIME: 0 minutes 41 seconds.  CDE 6.77   SURGEON:  Benay Pillow, MD, MPH   ANESTHESIA:  Topical with tetracaine drops augmented with 1% preservative-free intracameral lidocaine.  ESTIMATED BLOOD LOSS: <1 mL   COMPLICATIONS:  None.   DESCRIPTION OF PROCEDURE:  The patient was identified in the holding room and transported to the operating room and placed in the supine position under the operating microscope.  The left eye was identified as the operative eye and it was prepped and draped in the usual sterile ophthalmic fashion.   A 1.0 millimeter clear-corneal paracentesis was made at the 5:00 position. 0.5 ml of preservative-free 1% lidocaine with epinephrine was injected into the anterior chamber.  The anterior chamber was filled with Healon 5 viscoelastic.  A 2.4 millimeter keratome was used to make a near-clear corneal incision at the 2:00 position.  A curvilinear capsulorrhexis was made with a cystotome and capsulorrhexis forceps.  Balanced salt solution was used to hydrodissect and hydrodelineate the nucleus.   Phacoemulsification was then used in stop and chop fashion to remove the lens nucleus and epinucleus.  The remaining cortex was then removed using the irrigation and aspiration handpiece. Healon was then placed into the capsular  bag to distend it for lens placement.  A lens was then injected into the capsular bag.  The remaining viscoelastic was aspirated.   Wounds were hydrated with balanced salt solution.  The anterior chamber was inflated to a physiologic pressure with balanced salt solution.  Intracameral vigamox 0.1 mL undiltued was injected into the eye and a drop placed onto the ocular surface.  No wound leaks were noted.  The patient was taken to the recovery room in stable condition without complications of anesthesia or surgery  Benay Pillow 02/20/2020, 10:50 AM

## 2020-02-20 NOTE — Transfer of Care (Signed)
Immediate Anesthesia Transfer of Care Note  Patient: Alexandra Copeland  Procedure(s) Performed: CATARACT EXTRACTION PHACO AND INTRAOCULAR LENS PLACEMENT (IOC) LEFT DIABETIC 6.77 00:41.8 (Left Eye)  Patient Location: PACU  Anesthesia Type: MAC  Level of Consciousness: awake, alert  and patient cooperative  Airway and Oxygen Therapy: Patient Spontanous Breathing and Patient connected to supplemental oxygen  Post-op Assessment: Post-op Vital signs reviewed, Patient's Cardiovascular Status Stable, Respiratory Function Stable, Patent Airway and No signs of Nausea or vomiting  Post-op Vital Signs: Reviewed and stable  Complications: No complications documented.

## 2020-02-20 NOTE — Anesthesia Procedure Notes (Signed)
Procedure Name: MAC Date/Time: 02/20/2020 10:39 AM Performed by: Nyoka Cowden, CRNA Pre-anesthesia Checklist: Patient identified, Emergency Drugs available, Suction available, Timeout performed and Patient being monitored Patient Re-evaluated:Patient Re-evaluated prior to induction Oxygen Delivery Method: Nasal cannula Placement Confirmation: positive ETCO2

## 2020-02-20 NOTE — H&P (Signed)

## 2020-02-20 NOTE — Anesthesia Preprocedure Evaluation (Signed)
Anesthesia Evaluation  Patient identified by MRN, date of birth, ID band Patient awake    Reviewed: Allergy & Precautions, H&P , NPO status , Patient's Chart, lab work & pertinent test results, reviewed documented beta blocker date and time   Airway Mallampati: II  TM Distance: >3 FB Neck ROM: full    Dental no notable dental hx.    Pulmonary neg pulmonary ROS, Current Smoker and Patient abstained from smoking.,    Pulmonary exam normal breath sounds clear to auscultation       Cardiovascular Exercise Tolerance: Good hypertension, negative cardio ROS   Rhythm:regular Rate:Normal     Neuro/Psych Depression CVA, No Residual Symptoms    GI/Hepatic negative GI ROS, Neg liver ROS,   Endo/Other  diabetes, Type 2Hypothyroidism   Renal/GU negative Renal ROS  negative genitourinary   Musculoskeletal   Abdominal   Peds  Hematology negative hematology ROS (+)   Anesthesia Other Findings   Reproductive/Obstetrics negative OB ROS                             Anesthesia Physical Anesthesia Plan  ASA: III  Anesthesia Plan: MAC   Post-op Pain Management:    Induction:   PONV Risk Score and Plan: 1 and Treatment may vary due to age or medical condition  Airway Management Planned:   Additional Equipment:   Intra-op Plan:   Post-operative Plan:   Informed Consent: I have reviewed the patients History and Physical, chart, labs and discussed the procedure including the risks, benefits and alternatives for the proposed anesthesia with the patient or authorized representative who has indicated his/her understanding and acceptance.     Dental Advisory Given  Plan Discussed with: CRNA  Anesthesia Plan Comments:         Anesthesia Quick Evaluation

## 2020-02-22 ENCOUNTER — Ambulatory Visit (INDEPENDENT_AMBULATORY_CARE_PROVIDER_SITE_OTHER): Payer: Medicare Other

## 2020-02-22 ENCOUNTER — Encounter: Payer: Self-pay | Admitting: Ophthalmology

## 2020-02-22 VITALS — Ht 67.0 in | Wt 197.0 lb

## 2020-02-22 DIAGNOSIS — Z0000 Encounter for general adult medical examination without abnormal findings: Secondary | ICD-10-CM | POA: Diagnosis not present

## 2020-02-22 NOTE — Progress Notes (Addendum)
Subjective:   Alexandra Copeland is a 74 y.o. female who presents for Medicare Annual (Subsequent) preventive examination.  Review of Systems    No ROS.  Medicare Wellness Virtual Visit.  Cardiac Risk Factors include: advanced age (>51men, >19 women);hypertension;diabetes mellitus     Objective:    Today's Vitals   02/22/20 1107  Weight: 197 lb (89.4 kg)  Height: 5\' 7"  (1.702 m)   Body mass index is 30.85 kg/m.  Advanced Directives 02/22/2020 02/20/2020 02/21/2019 12/16/2017 12/10/2016 12/11/2015  Does Patient Have a Medical Advance Directive? No No Yes Yes Yes Yes  Type of Advance Directive - Public librarian;Living will [place];Living will [place];Living will Living will  Does patient want to make changes to medical advance directive? - - No - Patient declined No - Patient declined No - Patient declined -  Copy of [place] in Chart? - - No - copy requested No - copy requested No - copy requested No - copy requested  Would patient like information on creating a medical advance directive? No - Patient declined No - Patient declined - - - -    Current Medications (verified) Outpatient Encounter Medications as of 02/22/2020  Medication Sig  . amLODipine (NORVASC) 2.5 MG tablet Take 1 tablet by mouth once daily  . aspirin 325 MG tablet Take 325 mg by mouth daily.  Marland Kitchen atorvastatin (LIPITOR) 80 MG tablet Take 1 tablet by mouth once daily  . cholecalciferol (VITAMIN D) 1000 UNITS tablet Take 1,000 Units by mouth daily.  . famotidine (PEPCID) 20 MG tablet Take 20 mg by mouth daily.  . Glucosamine Sulfate 500 MG CAPS   . Glucosamine Sulfate Potassium POWD 1,000 mg 2 (two) times daily.  . hydrochlorothiazide (HYDRODIURIL) 25 MG tablet Take 1 tablet by mouth once daily  . levothyroxine (SYNTHROID) 88 MCG tablet TAKE 1 TABLET BY MOUTH ONCE DAILY BEFORE BREAKFAST  . losartan (COZAAR) 100 MG tablet Take 1 tablet by mouth  once daily  . Multiple Vitamin (MULTIVITAMIN) tablet Take 1 tablet by mouth daily.  . nicotine (NICODERM CQ - DOSED IN MG/24 HOURS) 14 mg/24hr patch Place 14 mg onto the skin daily as needed.   No facility-administered encounter medications on file as of 02/22/2020.    Allergies (verified) Patient has no known allergies.   History: Past Medical History:  Diagnosis Date  . Hyperlipidemia   . Hypertension   . Hypothyroid   . Scarlet fever    as a child  . Stroke [facility]) June 2010   left parietal hypertensive   Past Surgical History:  Procedure Laterality Date  . BREAST CYST ASPIRATION Left 1978   neg  . BREAST SURGERY  1975   abscess drained  . CATARACT EXTRACTION W/PHACO Left 02/20/2020   Procedure: CATARACT EXTRACTION PHACO AND INTRAOCULAR LENS PLACEMENT (IOC) LEFT DIABETIC 6.77 00:41.8;  Surgeon: [name], MD;  Location: [place];  Service: Ophthalmology;  Laterality: Left;  . TUBAL LIGATION     Family History  Problem Relation Age of Onset  . Heart disease Mother   . Hypertension Mother   . Stroke Father   . Hyperlipidemia Father   . Vision loss Father   . Hypertension Brother   . Diabetes Paternal Grandmother   . Breast cancer Neg Hx    Social History   Socioeconomic History  . Marital status: Single    Spouse name: Not on file  . Number of  children: Not on file  . Years of education: Not on file  . Highest education level: Not on file  Occupational History  . Not on file  Tobacco Use  . Smoking status: Current Every Day Smoker    Packs/day: 1.00    Years: 26.00    Pack years: 26.00    Types: Cigarettes    Start date: 05/10/2018  . Smokeless tobacco: Never Used  . Tobacco comment: quit for several years.  Has recently started back.  Vaping Use  . Vaping Use: Never used  Substance and Sexual Activity  . Alcohol use: Yes    Alcohol/week: 1.0 standard drink    Types: 1 Glasses of wine per week    Comment: OCC glass of wine  . Drug  use: No  . Sexual activity: Never  Other Topics Concern  . Not on file  Social History Narrative  . Not on file   Social Determinants of Health   Financial Resource Strain:   . Difficulty of Paying Living Expenses:   Food Insecurity:   . Worried About Charity fundraiser in the Last Year:   . Arboriculturist in the Last Year:   Transportation Needs:   . Film/video editor (Medical):   Marland Kitchen Lack of Transportation (Non-Medical):   Physical Activity:   . Days of Exercise per Week:   . Minutes of Exercise per Session:   Stress:   . Feeling of Stress :   Social Connections:   . Frequency of Communication with Friends and Family:   . Frequency of Social Gatherings with Friends and Family:   . Attends Religious Services:   . Active Member of Clubs or Organizations:   . Attends Archivist Meetings:   Marland Kitchen Marital Status:     Tobacco Counseling Ready to quit: Not Answered Counseling given: Not Answered Comment: quit for several years.  Has recently started back.   Clinical Intake:  Pre-visit preparation completed: Yes        Diabetes: Yes (Followed by pcp)  How often do you need to have someone help you when you read instructions, pamphlets, or other written materials from your doctor or pharmacy?: 1 - Never  Interpreter Needed?: No      Activities of Daily Living In your present state of health, do you have any difficulty performing the following activities: 02/22/2020 02/20/2020  Hearing? N N  Vision? N N  Difficulty concentrating or making decisions? N N  Walking or climbing stairs? N N  Dressing or bathing? N N  Doing errands, shopping? N -  Preparing Food and eating ? N -  Using the Toilet? N -  In the past six months, have you accidently leaked urine? N -  Do you have problems with loss of bowel control? N -  Managing your Medications? N -  Managing your Finances? N -  Housekeeping or managing your Housekeeping? N -  Some recent data might be  hidden    Patient Care Team: [name], MD as PCP - General (Internal Medicine)  Indicate any recent Medical Services you may have received from other than [facility] providers in the past year (date may be approximate).     Assessment:   This is a routine wellness examination for Alexandra Copeland.  I connected with Alexandra Copeland today by telephone and verified that I am speaking with the correct person using two identifiers. Location patient: home Location provider: work Persons participating in the virtual visit: patient,  nurse.    I discussed the limitations, risks, security and privacy concerns of performing an evaluation and management service by telephone and the availability of in person appointments. The patient expressed understanding and verbally consented to this telephonic visit.    Interactive audio and video telecommunications were attempted between this provider and patient, however failed, due to patient having technical difficulties OR patient did not have access to video capability.  We continued and completed visit with audio only.  Some vital signs may be absent or patient reported.   Hearing/Vision screen  Hearing Screening   125Hz  250Hz  500Hz  1000Hz  2000Hz  3000Hz  4000Hz  6000Hz  8000Hz   Right ear:           Left ear:           Comments: Patient is able to hear conversational tones without difficulty.  No issues reported.  Vision Screening Comments: Followed by [facility] Cataract extraction Visual acuity not assessed, virtual visit.  They have seen their ophthalmologist in the last 12 months.     Dietary issues and exercise activities discussed: Current Exercise Habits: Home exercise routine, Intensity: MildLow carb  Good water intake Caffeine- coffee throughout the day  Goals      Patient Stated   .  Quit Smoking (pt-stated)      Follow up with pcp      Depression Screen PHQ 2/9 Scores 02/22/2020 02/21/2019 12/16/2017 12/10/2016 12/11/2015 02/05/2015 05/02/2014   PHQ - 2 Score 0 0 0 0 0 0 2  PHQ- 9 Score - - - 0 - - 5    Fall Risk Fall Risk  02/22/2020 07/05/2019 02/21/2019 12/16/2017 12/10/2016  Falls in the past year? 0 0 0 No No  Comment - Emmi Telephone Survey: data to providers prior to load - - -  Number falls in past yr: 0 - - - -  Follow up Falls evaluation completed - - - -   Handrails in use when climbing stairs? Yes  Home free of loose throw rugs in walkways, pet beds, electrical cords, etc? Yes  Adequate lighting in your home to reduce risk of falls? Yes   TIMED UP AND GO: Was the test performed? No . Virtual visit.  Cognitive Function: MMSE - Mini Mental State Exam 02/22/2020 12/16/2017 12/10/2016 12/11/2015  Not completed: Unable to complete - - -  Orientation to time - 5 5 5   Orientation to Place - 5 5 5   Registration - 3 3 3   Attention/ Calculation - 5 5 5   Recall - 3 3 3   Language- name 2 objects - 2 2 2   Language- repeat - 1 1 1   Language- follow 3 step command - 3 3 3   Language- read & follow direction - 1 1 1   Write a sentence - 1 1 1   Copy design - 1 1 1   Total score - 30 30 30      6CIT Screen 02/21/2019  What Year? 0 points  What month? 0 points  What time? 0 points  Count back from 20 0 points  Months in reverse 0 points  Repeat phrase 0 points  Total Score 0   Immunizations Immunization History  Administered Date(s) Administered  . Influenza, High Dose Seasonal PF 08/11/2018  . Influenza,inj,Quad PF,6+ Mos 05/24/2013, 04/30/2014  . Moderna SARS-COVID-2 Vaccination 10/02/2019, 10/31/2019  . PPD Test 09/18/2014  . Pneumococcal Conjugate-13 05/28/2014  . Pneumococcal Polysaccharide-23 05/29/2010, 08/18/2018  . Tdap 05/28/2010   Health Maintenance Health Maintenance  Topic Date Due  .  FOOT EXAM  08/12/2019  . HEMOGLOBIN A1C  08/25/2019  . MAMMOGRAM  10/04/2019  . INFLUENZA VACCINE  03/10/2020  . TETANUS/TDAP  05/28/2020  . OPHTHALMOLOGY EXAM  01/02/2021  . Fecal DNA (Cologuard)  09/02/2021  . DEXA SCAN   Completed  . COVID-19 Vaccine  Completed  . Hepatitis C Screening  Completed  . PNA vac Low Risk Adult  Completed   Foot exam- Denies wounds, numbness, tingling. Wears shoes when walking outside. Followed by pcp.  Mammogram- deferred per patient  A1C- 02/22/20 (6.0)  Dental Screening: Recommended annual dental exams for proper oral hygiene. Visits every 6 months.   Community Resource Referral / Chronic Care Management: CRR required this visit?  No   CCM required this visit?  No      Plan:   Keep all routine maintenance appointments.   Annual visit 03/14/20 @ 9:30  I have personally reviewed and noted the following in the patient's chart:   . Medical and social history . Use of alcohol, tobacco or illicit drugs  . Current medications and supplements . Functional ability and status . Nutritional status . Physical activity . Advanced directives . List of other physicians . Hospitalizations, surgeries, and ER visits in previous 12 months . Vitals . Screenings to include cognitive, depression, and falls . Referrals and appointments  In addition, I have reviewed and discussed with patient certain preventive protocols, quality metrics, and best practice recommendations. A written personalized care plan for preventive services as well as general preventive health recommendations were provided to patient via mychart.     OBrien-Blaney, Daphyne Miguez L, LPN   [id number]/38/[id number]     I have reviewed the above information and agree with above.   Deborra Medina, MD

## 2020-02-22 NOTE — Patient Instructions (Addendum)
Alexandra Copeland , Thank you for taking time to come for your Medicare Wellness Visit. I appreciate your ongoing commitment to your health goals. Please review the following plan we discussed and let me know if I can assist you in the future.   These are the goals we discussed: Goals      Patient Stated   .  Quit Smoking (pt-stated)      Follow up with pcp       This is a list of the screening recommended for you and due dates:  Health Maintenance  Topic Date Due  . Complete foot exam   08/12/2019  . Hemoglobin A1C  08/25/2019  . Mammogram  10/04/2019  . Flu Shot  03/10/2020  . Tetanus Vaccine  05/28/2020  . Eye exam for diabetics  01/02/2021  . Cologuard (Stool DNA test)  09/02/2021  . DEXA scan (bone density measurement)  Completed  . COVID-19 Vaccine  Completed  .  Hepatitis C: One time screening is recommended by Center for Disease Control  (CDC) for  adults born from [birth year] through 1964.   Completed  . Pneumonia vaccines  Completed   Immunizations Immunization History  Administered Date(s) Administered  . Influenza, High Dose Seasonal PF 08/11/2018  . Influenza,inj,Quad PF,6+ Mos 05/24/2013, 04/30/2014  . Moderna SARS-COVID-2 Vaccination 10/02/2019, 10/31/2019  . PPD Test 09/18/2014  . Pneumococcal Conjugate-13 05/28/2014  . Pneumococcal Polysaccharide-23 05/29/2010, 08/18/2018  . Tdap 05/28/2010   Keep all routine maintenance appointments.   Annual visit 03/14/20 @ 9:30  Advanced directives: declined  Conditions/risks identified: none new  Follow up in one year for your annual wellness visit   Preventive Care 65 Years and Older, Female Preventive care refers to lifestyle choices and visits with your health care provider that can promote health and wellness. What does preventive care include?  A yearly physical exam. This is also called an annual well check.  Dental exams once or twice a year.  Routine eye exams. Ask your health care provider how often you  should have your eyes checked.  Personal lifestyle choices, including:  Daily care of your teeth and gums.  Regular physical activity.  Eating a healthy diet.  Avoiding tobacco and drug use.  Limiting alcohol use.  Practicing safe sex.  Taking low-dose aspirin every day.  Taking vitamin and mineral supplements as recommended by your health care provider. What happens during an annual well check? The services and screenings done by your health care provider during your annual well check will depend on your age, overall health, lifestyle risk factors, and family history of disease. Counseling  Your health care provider may ask you questions about your:  Alcohol use.  Tobacco use.  Drug use.  Emotional well-being.  Home and relationship well-being.  Sexual activity.  Eating habits.  History of falls.  Memory and ability to understand (cognition).  Work and work Statistician.  Reproductive health. Screening  You may have the following tests or measurements:  Height, weight, and BMI.  Blood pressure.  Lipid and cholesterol levels. These may be checked every 5 years, or more frequently if you are over 22 years old.  Skin check.  Lung cancer screening. You may have this screening every year starting at age 36 if you have a 30-pack-year history of smoking and currently smoke or have quit within the past 15 years.  Fecal occult blood test (FOBT) of the stool. You may have this test every year starting at age 34.  Flexible sigmoidoscopy  or colonoscopy. You may have a sigmoidoscopy every 5 years or a colonoscopy every 10 years starting at age 93.  Hepatitis C blood test.  Hepatitis B blood test.  Sexually transmitted disease (STD) testing.  Diabetes screening. This is done by checking your blood sugar (glucose) after you have not eaten for a while (fasting). You may have this done every 1-3 years.  Bone density scan. This is done to screen for osteoporosis.  You may have this done starting at age 87.  Mammogram. This may be done every 1-2 years. Talk to your health care provider about how often you should have regular mammograms. Talk with your health care provider about your test results, treatment options, and if necessary, the need for more tests. Vaccines  Your health care provider may recommend certain vaccines, such as:  Influenza vaccine. This is recommended every year.  Tetanus, diphtheria, and acellular pertussis (Tdap, Td) vaccine. You may need a Td booster every 10 years.  Zoster vaccine. You may need this after age 6.  Pneumococcal 13-valent conjugate (PCV13) vaccine. One dose is recommended after age 84.  Pneumococcal polysaccharide (PPSV23) vaccine. One dose is recommended after age 24. Talk to your health care provider about which screenings and vaccines you need and how often you need them. This information is not intended to replace advice given to you by your health care provider. Make sure you discuss any questions you have with your health care provider. Document Released: 08/23/2015 Document Revised: 04/15/2016 Document Reviewed: 05/28/2015 Elsevier Interactive Patient Education  2017 [place] Prevention in the Home Falls can cause injuries. They can happen to people of all ages. There are many things you can do to make your home safe and to help prevent falls. What can I do on the outside of my home?  Regularly fix the edges of walkways and driveways and fix any cracks.  Remove anything that might make you trip as you walk through a door, such as a raised step or threshold.  Trim any bushes or trees on the path to your home.  Use bright outdoor lighting.  Clear any walking paths of anything that might make someone trip, such as rocks or tools.  Regularly check to see if handrails are loose or broken. Make sure that both sides of any steps have handrails.  Any raised decks and porches should have  guardrails on the edges.  Have any leaves, snow, or ice cleared regularly.  Use sand or salt on walking paths during winter.  Clean up any spills in your garage right away. This includes oil or grease spills. What can I do in the bathroom?  Use night lights.  Install grab bars by the toilet and in the tub and shower. Do not use towel bars as grab bars.  Use non-skid mats or decals in the tub or shower.  If you need to sit down in the shower, use a plastic, non-slip stool.  Keep the floor dry. Clean up any water that spills on the floor as soon as it happens.  Remove soap buildup in the tub or shower regularly.  Attach bath mats securely with double-sided non-slip rug tape.  Do not have throw rugs and other things on the floor that can make you trip. What can I do in the bedroom?  Use night lights.  Make sure that you have a light by your bed that is easy to reach.  Do not use any sheets or blankets that are  too big for your bed. They should not hang down onto the floor.  Have a firm chair that has side arms. You can use this for support while you get dressed.  Do not have throw rugs and other things on the floor that can make you trip. What can I do in the kitchen?  Clean up any spills right away.  Avoid walking on wet floors.  Keep items that you use a lot in easy-to-reach places.  If you need to reach something above you, use a strong step stool that has a grab bar.  Keep electrical cords out of the way.  Do not use floor polish or wax that makes floors slippery. If you must use wax, use non-skid floor wax.  Do not have throw rugs and other things on the floor that can make you trip. What can I do with my stairs?  Do not leave any items on the stairs.  Make sure that there are handrails on both sides of the stairs and use them. Fix handrails that are broken or loose. Make sure that handrails are as long as the stairways.  Check any carpeting to make sure that  it is firmly attached to the stairs. Fix any carpet that is loose or worn.  Avoid having throw rugs at the top or bottom of the stairs. If you do have throw rugs, attach them to the floor with carpet tape.  Make sure that you have a light switch at the top of the stairs and the bottom of the stairs. If you do not have them, ask someone to add them for you. What else can I do to help prevent falls?  Wear shoes that:  Do not have high heels.  Have rubber bottoms.  Are comfortable and fit you well.  Are closed at the toe. Do not wear sandals.  If you use a stepladder:  Make sure that it is fully opened. Do not climb a closed stepladder.  Make sure that both sides of the stepladder are locked into place.  Ask someone to hold it for you, if possible.  Clearly mark and make sure that you can see:  Any grab bars or handrails.  First and last steps.  Where the edge of each step is.  Use tools that help you move around (mobility aids) if they are needed. These include:  Canes.  Walkers.  Scooters.  Crutches.  Turn on the lights when you go into a dark area. Replace any light bulbs as soon as they burn out.  Set up your furniture so you have a clear path. Avoid moving your furniture around.  If any of your floors are uneven, fix them.  If there are any pets around you, be aware of where they are.  Review your medicines with your doctor. Some medicines can make you feel dizzy. This can increase your chance of falling. Ask your doctor what other things that you can do to help prevent falls. This information is not intended to replace advice given to you by your health care provider. Make sure you discuss any questions you have with your health care provider. Document Released: 05/23/2009 Document Revised: 01/02/2016 Document Reviewed: 08/31/2014 Elsevier Interactive Patient Education  2017 [employer].

## 2020-03-06 DIAGNOSIS — H2511 Age-related nuclear cataract, right eye: Secondary | ICD-10-CM | POA: Diagnosis not present

## 2020-03-12 ENCOUNTER — Encounter: Payer: Self-pay | Admitting: Ophthalmology

## 2020-03-12 ENCOUNTER — Other Ambulatory Visit: Payer: Self-pay

## 2020-03-14 ENCOUNTER — Other Ambulatory Visit: Payer: Self-pay

## 2020-03-14 ENCOUNTER — Encounter: Payer: Self-pay | Admitting: Internal Medicine

## 2020-03-14 ENCOUNTER — Ambulatory Visit (INDEPENDENT_AMBULATORY_CARE_PROVIDER_SITE_OTHER): Payer: Medicare Other | Admitting: Internal Medicine

## 2020-03-14 ENCOUNTER — Other Ambulatory Visit
Admission: RE | Admit: 2020-03-14 | Discharge: 2020-03-14 | Disposition: A | Discharge status: Home / Self Care | Payer: Medicare Other | Source: Ambulatory Visit | Attending: Ophthalmology | Admitting: Ophthalmology

## 2020-03-14 VITALS — BP 156/74 | HR 55 | Temp 98.6°F | Resp 15 | Ht 67.0 in | Wt 194.6 lb

## 2020-03-14 DIAGNOSIS — Z20822 Contact with and (suspected) exposure to covid-19: Secondary | ICD-10-CM | POA: Diagnosis not present

## 2020-03-14 DIAGNOSIS — I10 Essential (primary) hypertension: Secondary | ICD-10-CM | POA: Diagnosis not present

## 2020-03-14 DIAGNOSIS — Z1231 Encounter for screening mammogram for malignant neoplasm of breast: Secondary | ICD-10-CM

## 2020-03-14 DIAGNOSIS — Z01812 Encounter for preprocedural laboratory examination: Secondary | ICD-10-CM | POA: Insufficient documentation

## 2020-03-14 DIAGNOSIS — B3789 Other sites of candidiasis: Secondary | ICD-10-CM

## 2020-03-14 DIAGNOSIS — E782 Mixed hyperlipidemia: Secondary | ICD-10-CM | POA: Diagnosis not present

## 2020-03-14 DIAGNOSIS — Z87891 Personal history of nicotine dependence: Secondary | ICD-10-CM | POA: Diagnosis not present

## 2020-03-14 DIAGNOSIS — E034 Atrophy of thyroid (acquired): Secondary | ICD-10-CM | POA: Diagnosis not present

## 2020-03-14 DIAGNOSIS — E119 Type 2 diabetes mellitus without complications: Secondary | ICD-10-CM

## 2020-03-14 LAB — CBC WITH DIFFERENTIAL/PLATELET
Basophils Absolute: 0.1 10*3/uL (ref 0.0–0.1)
Basophils Relative: 0.8 % (ref 0.0–3.0)
Eosinophils Absolute: 0.1 10*3/uL (ref 0.0–0.7)
Eosinophils Relative: 1.6 % (ref 0.0–5.0)
HCT: 43 % (ref 36.0–46.0)
Hemoglobin: 15.1 g/dL — ABNORMAL HIGH (ref 12.0–15.0)
Lymphocytes Relative: 22.9 % (ref 12.0–46.0)
Lymphs Abs: 1.5 10*3/uL (ref 0.7–4.0)
MCHC: 35.2 g/dL (ref 30.0–36.0)
MCV: 91.5 fl (ref 78.0–100.0)
Monocytes Absolute: 0.6 10*3/uL (ref 0.1–1.0)
Monocytes Relative: 9.5 % (ref 3.0–12.0)
Neutro Abs: 4.2 10*3/uL (ref 1.4–7.7)
Neutrophils Relative %: 65.2 % (ref 43.0–77.0)
Platelets: 220 10*3/uL (ref 150.0–400.0)
RBC: 4.7 Mil/uL (ref 3.87–5.11)
RDW: 14.4 % (ref 11.5–15.5)
WBC: 6.5 10*3/uL (ref 4.0–10.5)

## 2020-03-14 LAB — COMPREHENSIVE METABOLIC PANEL
ALT: 22 U/L (ref 0–35)
AST: 26 U/L (ref 0–37)
Albumin: 4.4 g/dL (ref 3.5–5.2)
Alkaline Phosphatase: 103 U/L (ref 39–117)
BUN: 20 mg/dL (ref 6–23)
CO2: 29 mEq/L (ref 19–32)
Calcium: 9.9 mg/dL (ref 8.4–10.5)
Chloride: 105 mEq/L (ref 96–112)
Creatinine, Ser: 0.81 mg/dL (ref 0.40–1.20)
GFR: 69.24 mL/min (ref >60.00)
Glucose, Bld: 111 mg/dL — ABNORMAL HIGH (ref 70–99)
Potassium: 3.7 mEq/L (ref 3.5–5.1)
Sodium: 143 mEq/L (ref 135–145)
Total Bilirubin: 0.6 mg/dL (ref 0.2–1.2)
Total Protein: 6.7 g/dL (ref 6.0–8.3)

## 2020-03-14 LAB — HEMOGLOBIN A1C: Hgb A1c MFr Bld: 6.2 % (ref 4.6–6.5)

## 2020-03-14 LAB — LIPID PANEL
Cholesterol: 170 mg/dL (ref 0–200)
HDL: 40.7 mg/dL (ref >39.00)
LDL Cholesterol: 94 mg/dL (ref 0–99)
NonHDL: 129.22
Total CHOL/HDL Ratio: 4
Triglycerides: 174 mg/dL — ABNORMAL HIGH (ref 0.0–149.0)
VLDL: 34.8 mg/dL (ref 0.0–40.0)

## 2020-03-14 LAB — TSH: TSH: 1.81 u[IU]/mL (ref 0.35–4.50)

## 2020-03-14 LAB — MICROALBUMIN / CREATININE URINE RATIO
Creatinine,U: 183.1 mg/dL
Microalb Creat Ratio: 1.1 mg/g (ref 0.0–30.0)
Microalb, Ur: 2 mg/dL — ABNORMAL HIGH (ref 0.0–1.9)

## 2020-03-14 MED ORDER — TRIAMCINOLONE ACETONIDE 0.1 % EX CREA
1.0000 | TOPICAL_CREAM | Freq: Two times a day (BID) | CUTANEOUS | 0 refills | Status: DC
Start: 2020-03-14 — End: 2023-08-23

## 2020-03-14 MED ORDER — TETANUS-DIPHTH-ACELL PERTUSSIS 5-2.5-18.5 LF-MCG/0.5 IM SUSP: 0.5000 mL | Freq: Once | INTRAMUSCULAR | 0 refills | Status: AC

## 2020-03-14 MED ORDER — NYSTATIN 100000 UNIT/GM EX POWD
1.0000 | Freq: Two times a day (BID) | CUTANEOUS | 0 refills | Status: DC
Start: 2020-03-14 — End: 2020-08-06

## 2020-03-14 NOTE — Progress Notes (Signed)
Patient ID: Alexandra Copeland, female    DOB: 06/22/1946  Age: 74 y.o. MRN: [id number]  The patient is here for follow up and management of other chronic and acute problems.  This visit occurred during the SARS-CoV-2 public health emergency.  Safety protocols were in place, including screening questions prior to the visit, additional usage of staff PPE, and extensive cleaning of exam room while observing appropriate contact time as indicated for disinfecting solutions.     The risk factors are reflected in the social history.  The roster of all physicians providing medical care to patient - is listed in the Snapshot section of the chart.  Activities of daily living:  The patient is 100% independent in all ADLs: dressing, toileting, feeding as well as independent mobility  Home safety : The patient has smoke detectors in the home. They wear seatbelts.  There are no firearms at home. There is no violence in the home.   There is no risks for hepatitis, STDs or HIV. There is no   history of blood transfusion. They have no travel history to infectious disease endemic areas of the world.  The patient has seen their dentist in the last six month. They have seen their eye doctor in the last year. They admit to slight hearing difficulty with regard to whispered voices and some television programs.  They have deferred audiologic testing in the last year.  They do not  have excessive sun exposure. Discussed the need for sun protection: hats, long sleeves and use of sunscreen if there is significant sun exposure.   Diet: the importance of a healthy diet is discussed. They do have a healthy diet.  The benefits of regular aerobic exercise were discussed. She walks 4 times per week ,  20 minutes.   Depression screen: there are no signs or vegative symptoms of depression- irritability, change in appetite, anhedonia, sadness/tearfullness.  Cognitive assessment: the patient manages all their financial and  personal affairs and is actively engaged. They could relate day,date,year and events; recalled 2/3 objects at 3 minutes; performed clock-face test normally.  The following portions of the patient's history were reviewed and updated as appropriate: allergies, current medications, past family history, past medical history,  past surgical history, past social history  and problem list.  Visual acuity was not assessed per patient preference since she has regular follow up with her ophthalmologist. Hearing and body mass index were assessed and reviewed.   During the course of the visit the patient was educated and counseled about appropriate screening and preventive services including : fall prevention , diabetes screening, nutrition counseling, colorectal cancer screening, and recommended immunizations.    CC: The primary encounter diagnosis was Encounter for screening mammogram for breast cancer. Diagnoses of Hypothyroidism due to acquired atrophy of thyroid, Mixed hyperlipidemia, Type 2 diabetes mellitus without complication, without long-term current use of insulin ([place]), Candidiasis of breast, History of tobacco abuse, and Essential hypertension were also pertinent to this visit.  1) sudden onset of left knee pain several hours after garden digging.  Improved  After rest, ice and ibuprofen taken once. .  Bursitis  2) LOST A CROWN after 44 yrs. Tooth needs to be pulled by oral surgeon but roots are in left maxillary sinus. Has not seen oral surgery yet.   3) awaiting cataract surgery right side to be Monday by Dr Alexandra Copeland. Left one was done 4 weeks ago with lens placed,  and very happy with results  4) HTN:  Not  checking lately . Taking medications as directed   5) Tobacco abuse: quit July 1  Using nicoderm patch  6) pruritic red rash under left breast ,. using steroid cream on it.    History Alexandra Copeland has a past medical history of Hyperlipidemia, Hypertension, Hypothyroid, Scarlet fever, and Stroke  [facility]) (June 2010).   She has a past surgical history that includes Breast surgery (1975); Breast cyst aspiration (Left, 1978); Tubal ligation; and Cataract extraction w/PHACO (Left, 02/20/2020).   Her family history includes Diabetes in her paternal grandmother; Heart disease in her mother; Hyperlipidemia in her father; Hypertension in her brother and mother; Stroke in her father; Vision loss in her father.She reports that she has been smoking cigarettes. She started smoking about 22 months ago. She has a 26.00 pack-year smoking history. She has never used smokeless tobacco. She reports current alcohol use of about 1.0 standard drink of alcohol per week. She reports that she does not use drugs.  Outpatient Medications Prior to Visit  Medication Sig Dispense Refill  . amLODipine (NORVASC) 2.5 MG tablet Take 1 tablet by mouth once daily 90 tablet 0  . aspirin 325 MG tablet Take 325 mg by mouth daily.    Marland Kitchen atorvastatin (LIPITOR) 80 MG tablet Take 1 tablet by mouth once daily 90 tablet 3  . cholecalciferol (VITAMIN D) 1000 UNITS tablet Take 1,000 Units by mouth daily.    . famotidine (PEPCID) 20 MG tablet Take 20 mg by mouth daily.    . Glucosamine Sulfate Potassium POWD 1,000 mg 2 (two) times daily.    . hydrochlorothiazide (HYDRODIURIL) 25 MG tablet Take 1 tablet by mouth once daily 90 tablet 0  . levothyroxine (SYNTHROID) 88 MCG tablet TAKE 1 TABLET BY MOUTH ONCE DAILY BEFORE BREAKFAST 90 tablet 0  . losartan (COZAAR) 100 MG tablet Take 1 tablet by mouth once daily 90 tablet 0  . Multiple Vitamin (MULTIVITAMIN) tablet Take 1 tablet by mouth daily.    . nicotine (NICODERM CQ - DOSED IN MG/24 HOURS) 14 mg/24hr patch Place 14 mg onto the skin daily as needed.    . Glucosamine Sulfate 500 MG CAPS      No facility-administered medications prior to visit.    Review of Systems   Patient denies headache, fevers, malaise, unintentional weight loss, skin rash, eye pain, sinus congestion and sinus pain,  sore throat, dysphagia,  hemoptysis , cough, dyspnea, wheezing, chest pain, palpitations, orthopnea, edema, abdominal pain, nausea, melena, diarrhea, constipation, flank pain, dysuria, hematuria, urinary  Frequency, nocturia, numbness, tingling, seizures,  Focal weakness, Loss of consciousness,  Tremor, insomnia, depression, anxiety, and suicidal ideation.     Objective:  BP (!) 156/74 (BP Location: Left Arm, Patient Position: Sitting, Cuff Size: Normal)   Pulse (!) 55   Temp 98.6 F (37 C) (Oral)   Resp 15   Ht 5\' 7"  (1.702 m)   Wt 194 lb 9.6 oz (88.3 kg)   SpO2 97%   BMI 30.48 kg/m   Physical Exam  General appearance: alert, cooperative and appears stated age Head: Normocephalic, without obvious abnormality, atraumatic Eyes: conjunctivae/corneas clear. PERRL, EOM's intact. Fundi benign. Ears: normal TM's and external ear canals both ears Nose: Nares normal. Septum midline. Mucosa normal. No drainage or sinus tenderness. Throat: lips, mucosa, and tongue normal; teeth and gums normal Neck: no adenopathy, no carotid bruit, no JVD, supple, symmetrical, trachea midline and thyroid not enlarged, symmetric, no tenderness/mass/nodules Lungs: clear to auscultation bilaterally Breasts: normal appearance, no masses or tenderness Heart:  regular rate and rhythm, S1, S2 normal, no murmur, click, rub or gallop Abdomen: soft, non-tender; bowel sounds normal; no masses,  no organomegaly Extremities: left knee without bursitis or edema.  Right extremity normal, atraumatic, no cyanosis or edema Pulses: 2+ and symmetric Skin: Skin color, texture, turgor normal. No rashes or lesions Neurologic: Alert and oriented X 3, normal strength and tone. Normal symmetric reflexes. Normal coordination and gait.    Assessment & Plan:   Problem List Items Addressed This Visit      Unprioritized   History of tobacco abuse    She is using Nicoderm to quit currently       Hyperlipidemia    Tolerating potent  dose statin therapy and direct LDL < 100 on maximal dose of atorvastatin  .   Liver enzymes are normal , no changes today. Elevated trigs addressed with diet and exercise.   Lab Results  Component Value Date   ALT 22 03/14/2020   AST 26 03/14/2020   ALKPHOS 103 03/14/2020   BILITOT 0.6 03/14/2020   Lab Results  Component Value Date   CHOL 170 03/14/2020   HDL 40.70 03/14/2020   LDLCALC 94 03/14/2020   LDLDIRECT 114.0 08/05/2017   TRIG 174.0 (H) 03/14/2020   CHOLHDL 4 03/14/2020             Relevant Orders   Lipid panel (Completed)   Hypertension    she reports compliance with medication regimen  but has an elevated reading today in office.  She is not using NSAIDs daily.  Discussed goal of  130/80 for patients over 70 to preserve renal function.  She has been asked to check her  BP  at home and  submit readings for evaluation. Renal function, electrolytes and screen for proteinuria are all normal . Lab Results  Component Value Date   CREATININE 0.81 03/14/2020   Lab Results  Component Value Date   NA 143 03/14/2020   K 3.7 03/14/2020   CL 105 03/14/2020   CO2 29 03/14/2020         Hypothyroidism    Thyroid function is WNL on current dose.  No current changes needed.   Lab Results  Component Value Date   TSH 1.81 03/14/2020         Relevant Orders   TSH (Completed)   Type 2 diabetes mellitus without complications (HCC)    Currently well-controlled on diet alone  .  hemoglobin A1c is at goal of less than 7.0 . Patient is reminded to schedule an annual eye exam and foot exam is normal today. Patient has no microalbuminuria. Patient is tolerating statin therapy for CAD risk reduction and on ACE/ARB for renal protection and hypertension .  Encouraged to continue daily exercise and follow low GI diet. .  There is a strong  FH of type 2 DM.  Lab Results  Component Value Date   HGBA1C 6.2 03/14/2020   Lab Results  Component Value Date   MICROALBUR 2.0 (H)  03/14/2020         Relevant Orders   Hemoglobin A1c (Completed)   Microalbumin / creatinine urine ratio (Completed)   Comprehensive metabolic panel (Completed)    Other Visit Diagnoses    Encounter for screening mammogram for breast cancer    -  Primary   Relevant Orders   MM 3D SCREEN BREAST BILATERAL   Candidiasis of breast       Relevant Medications   nystatin (MYCOSTATIN/NYSTOP) powder   Other  Relevant Orders   CBC with Differential/Platelet (Completed)      I provided  30 minutes of  face-to-face time during this encounter reviewing patient's current problems and past surgeries, labs and imaging studies, providing counseling on the above mentioned problems , and coordination  of care .  I am having [place]. [place] start on triamcinolone cream, nystatin, and Tdap. I am also having her maintain her cholecalciferol, aspirin, multivitamin, atorvastatin, amLODipine, hydrochlorothiazide, famotidine, Glucosamine Sulfate Potassium, nicotine, losartan, and levothyroxine.  Meds ordered this encounter  Medications  . triamcinolone cream (KENALOG) 0.1 %    Sig: Apply 1 application topically 2 (two) times daily.    Dispense:  80 g    Refill:  0  . nystatin (MYCOSTATIN/NYSTOP) powder    Sig: Apply 1 application topically 2 (two) times daily. To rash until resolved.    Dispense:  15 g    Refill:  0  . Tdap (BOOSTRIX) 5-2.5-18.5 LF-MCG/0.5 injection    Sig: Inject 0.5 mLs into the muscle once for 1 dose.    Dispense:  0.5 mL    Refill:  0    Medications Discontinued During This Encounter  Medication Reason  . Glucosamine Sulfate 166 MG CAPS Duplicate    Follow-up: Return in about 6 months (around 09/14/2020) for follow up diabetes.   [name], MD

## 2020-03-14 NOTE — Patient Instructions (Addendum)
  The new goals for optimal blood pressure management are 130/80.  Please check your blood pressure a few times at home and send me the readings so I can determine if you need a change in medication   Increase amlodipine to 5 mg the night before surgery since you are suspending hctz for  Day  If your home readings are persistently above 130/80,  We will increase the amlodipine to 5 mg And continue hctz and losartan.   Your  need your tetanus-diptheria-pertussis vaccine (TDaP) but you can get it for less $$$ at a local pharmacy with the script I have provided you.   Your annual mammogram has been ordered.  You are encouraged (required) to call to make your appointment at [facility] [phone]

## 2020-03-14 NOTE — Discharge Instructions (Signed)

## 2020-03-15 LAB — SARS CORONAVIRUS 2 (TAT 6-24 HRS): SARS Coronavirus 2: NEGATIVE

## 2020-03-16 NOTE — Assessment & Plan Note (Signed)
Tolerating potent dose statin therapy and direct LDL < 100 on maximal dose of atorvastatin  .   Liver enzymes are normal , no changes today. Elevated trigs addressed with diet and exercise.   Lab Results  Component Value Date   ALT 22 03/14/2020   AST 26 03/14/2020   ALKPHOS 103 03/14/2020   BILITOT 0.6 03/14/2020   Lab Results  Component Value Date   CHOL 170 03/14/2020   HDL 40.70 03/14/2020   LDLCALC 94 03/14/2020   LDLDIRECT 114.0 08/05/2017   TRIG 174.0 (H) 03/14/2020   CHOLHDL 4 03/14/2020

## 2020-03-16 NOTE — Assessment & Plan Note (Signed)
She is using Nicoderm to quit currently

## 2020-03-16 NOTE — Assessment & Plan Note (Addendum)
she reports compliance with medication regimen  but has an elevated reading today in office.  She is not using NSAIDs daily.  Discussed goal of  130/80 for patients over 70 to preserve renal function.  She has been asked to check her  BP  at home and  submit readings for evaluation. Renal function, electrolytes and screen for proteinuria are all normal . Lab Results  Component Value Date   CREATININE 0.81 03/14/2020   Lab Results  Component Value Date   NA 143 03/14/2020   K 3.7 03/14/2020   CL 105 03/14/2020   CO2 29 03/14/2020

## 2020-03-16 NOTE — Assessment & Plan Note (Signed)
Thyroid function is WNL on current dose.  No current changes needed.   Lab Results  Component Value Date   TSH 1.81 03/14/2020

## 2020-03-16 NOTE — Assessment & Plan Note (Addendum)
Currently well-controlled on diet alone  .  hemoglobin A1c is at goal of less than 7.0 . Patient is reminded to schedule an annual eye exam and foot exam is normal today. Patient has no microalbuminuria. Patient is tolerating statin therapy for CAD risk reduction and on ACE/ARB for renal protection and hypertension .  Encouraged to continue daily exercise and follow low GI diet. .  There is a strong  FH of type 2 DM.  Lab Results  Component Value Date   HGBA1C 6.2 03/14/2020   Lab Results  Component Value Date   MICROALBUR 2.0 (H) 03/14/2020

## 2020-03-18 ENCOUNTER — Ambulatory Visit: Payer: Medicare Other | Admitting: Anesthesiology

## 2020-03-18 ENCOUNTER — Encounter
Admission: RE | Disposition: A | Discharge status: Home / Self Care | Payer: Self-pay | Source: Home / Self Care | Attending: Ophthalmology

## 2020-03-18 ENCOUNTER — Other Ambulatory Visit: Payer: Self-pay

## 2020-03-18 ENCOUNTER — Ambulatory Visit
Admission: RE | Admit: 2020-03-18 | Discharge: 2020-03-18 | Disposition: A | Discharge status: Home / Self Care | Payer: Medicare Other | Attending: Ophthalmology | Admitting: Ophthalmology

## 2020-03-18 ENCOUNTER — Encounter: Payer: Self-pay | Admitting: Ophthalmology

## 2020-03-18 DIAGNOSIS — E7800 Pure hypercholesterolemia, unspecified: Secondary | ICD-10-CM | POA: Diagnosis not present

## 2020-03-18 DIAGNOSIS — Z79890 Hormone replacement therapy (postmenopausal): Secondary | ICD-10-CM | POA: Insufficient documentation

## 2020-03-18 DIAGNOSIS — Z8673 Personal history of transient ischemic attack (TIA), and cerebral infarction without residual deficits: Secondary | ICD-10-CM | POA: Insufficient documentation

## 2020-03-18 DIAGNOSIS — H25811 Combined forms of age-related cataract, right eye: Secondary | ICD-10-CM | POA: Diagnosis not present

## 2020-03-18 DIAGNOSIS — Z79899 Other long term (current) drug therapy: Secondary | ICD-10-CM | POA: Diagnosis not present

## 2020-03-18 DIAGNOSIS — Z7982 Long term (current) use of aspirin: Secondary | ICD-10-CM | POA: Diagnosis not present

## 2020-03-18 DIAGNOSIS — K219 Gastro-esophageal reflux disease without esophagitis: Secondary | ICD-10-CM | POA: Diagnosis not present

## 2020-03-18 DIAGNOSIS — F17200 Nicotine dependence, unspecified, uncomplicated: Secondary | ICD-10-CM | POA: Diagnosis not present

## 2020-03-18 DIAGNOSIS — I10 Essential (primary) hypertension: Secondary | ICD-10-CM | POA: Diagnosis not present

## 2020-03-18 DIAGNOSIS — H2511 Age-related nuclear cataract, right eye: Secondary | ICD-10-CM | POA: Insufficient documentation

## 2020-03-18 DIAGNOSIS — E039 Hypothyroidism, unspecified: Secondary | ICD-10-CM | POA: Diagnosis not present

## 2020-03-18 HISTORY — PX: CATARACT EXTRACTION W/PHACO: SHX586

## 2020-03-18 SURGERY — PHACOEMULSIFICATION, CATARACT, WITH IOL INSERTION
Anesthesia: Monitor Anesthesia Care | Site: Eye | Laterality: Right

## 2020-03-18 MED ORDER — MIDAZOLAM HCL 2 MG/2ML IJ SOLN
INTRAMUSCULAR | Status: DC | PRN
  Administered 2020-03-18: 2 mg via INTRAVENOUS

## 2020-03-18 MED ORDER — LIDOCAINE HCL (PF) 2 % IJ SOLN
INTRAOCULAR | Status: DC | PRN
  Administered 2020-03-18: 1 mL via INTRAOCULAR

## 2020-03-18 MED ORDER — MOXIFLOXACIN HCL 0.5 % OP SOLN
OPHTHALMIC | Status: DC | PRN
  Administered 2020-03-18: 0.2 mL via OPHTHALMIC

## 2020-03-18 MED ORDER — FENTANYL CITRATE (PF) 100 MCG/2ML IJ SOLN
INTRAMUSCULAR | Status: DC | PRN
  Administered 2020-03-18 (×2): 50 ug via INTRAVENOUS

## 2020-03-18 MED ORDER — TETRACAINE HCL 0.5 % OP SOLN
1.0000 [drp] | OPHTHALMIC | Status: DC | PRN
  Administered 2020-03-18 (×3): 1 [drp] via OPHTHALMIC

## 2020-03-18 MED ORDER — EPINEPHRINE PF 1 MG/ML IJ SOLN
INTRAOCULAR | Status: DC | PRN
  Administered 2020-03-18: 109 mL via OPHTHALMIC

## 2020-03-18 MED ORDER — SODIUM HYALURONATE 10 MG/ML IO SOLN
INTRAOCULAR | Status: DC | PRN
  Administered 2020-03-18: 0.55 mL via INTRAOCULAR

## 2020-03-18 MED ORDER — ONDANSETRON HCL 4 MG/2ML IJ SOLN: 4.0000 mg | Freq: Once | INTRAMUSCULAR | Status: DC | PRN

## 2020-03-18 MED ORDER — LACTATED RINGERS IV SOLN: INTRAVENOUS | Status: DC

## 2020-03-18 MED ORDER — ACETAMINOPHEN 10 MG/ML IV SOLN: 1000.0000 mg | Freq: Once | INTRAVENOUS | Status: DC | PRN

## 2020-03-18 MED ORDER — ARMC OPHTHALMIC DILATING DROPS
1.0000 "application " | OPHTHALMIC | Status: DC | PRN
  Administered 2020-03-18 (×3): 1 via OPHTHALMIC

## 2020-03-18 MED ORDER — SODIUM HYALURONATE 23 MG/ML IO SOLN
INTRAOCULAR | Status: DC | PRN
  Administered 2020-03-18: 0.6 mL via INTRAOCULAR

## 2020-03-18 SURGICAL SUPPLY — 20 items
CANNULA ANT/CHMB 27G (MISCELLANEOUS) ×2 IMPLANT
CANNULA ANT/CHMB 27GA (MISCELLANEOUS) ×6 IMPLANT
DISSECTOR HYDRO NUCLEUS 50X22 (MISCELLANEOUS) ×3 IMPLANT
GLOVE SURG LX 7.5 STRW (GLOVE) ×2
GLOVE SURG LX STRL 7.5 STRW (GLOVE) ×1 IMPLANT
GLOVE SURG SYN 8.5  E (GLOVE) ×2
GLOVE SURG SYN 8.5 E (GLOVE) ×1 IMPLANT
GLOVE SURG SYN 8.5 PF PI (GLOVE) ×1 IMPLANT
GOWN STRL REUS W/ TWL LRG LVL3 (GOWN DISPOSABLE) ×2 IMPLANT
GOWN STRL REUS W/TWL LRG LVL3 (GOWN DISPOSABLE) ×6
LENS IOL DIOP 23.0 (Intraocular Lens) ×3 IMPLANT
LENS IOL TECNIS MONO 23.0 (Intraocular Lens) IMPLANT
MARKER SKIN DUAL TIP RULER LAB (MISCELLANEOUS) ×3 IMPLANT
PACK DR. KING ARMS (PACKS) ×3 IMPLANT
PACK EYE AFTER SURG (MISCELLANEOUS) ×3 IMPLANT
PACK OPTHALMIC (MISCELLANEOUS) ×3 IMPLANT
SYR 3ML LL SCALE MARK (SYRINGE) ×3 IMPLANT
SYR TB 1ML LUER SLIP (SYRINGE) ×3 IMPLANT
WATER STERILE IRR 250ML POUR (IV SOLUTION) ×3 IMPLANT
WIPE NON LINTING 3.25X3.25 (MISCELLANEOUS) ×3 IMPLANT

## 2020-03-18 NOTE — Op Note (Signed)
OPERATIVE NOTE  Alexandra Copeland [id number] 03/18/2020   PREOPERATIVE DIAGNOSIS:  Nuclear sclerotic cataract right eye.  H25.11   POSTOPERATIVE DIAGNOSIS:    Nuclear sclerotic cataract right eye.     PROCEDURE:  Phacoemusification with posterior chamber intraocular lens placement of the right eye   LENS:   Implant Name Type Inv. Item Serial No. Manufacturer Lot No. LRB No. Used Action  LENS IOL DIOP 23.0 - [id number] Intraocular Lens LENS IOL DIOP 23.0 [id number] [employer]  Right 1 Implanted       Procedure(s): CATARACT EXTRACTION PHACO AND INTRAOCULAR LENS PLACEMENT (IOC) RIGHT DIABETIC 9.71  01:00.9 (Right)  DCB00 +23.0   ULTRASOUND TIME: 1 minutes 0 seconds.  CDE 9.71   SURGEON:  Benay Pillow, MD, MPH  ANESTHESIOLOGIST: Anesthesiologist: Heniser, Fredric Dine, MD CRNA: Jeannene Patella, CRNA   ANESTHESIA:  Topical with tetracaine drops augmented with 1% preservative-free intracameral lidocaine.  ESTIMATED BLOOD LOSS: less than 1 mL.   COMPLICATIONS:  None.   DESCRIPTION OF PROCEDURE:  The patient was identified in the holding room and transported to the operating room and placed in the supine position under the operating microscope.  The right eye was identified as the operative eye and it was prepped and draped in the usual sterile ophthalmic fashion.   A 1.0 millimeter clear-corneal paracentesis was made at the 10:30 position. 0.5 ml of preservative-free 1% lidocaine with epinephrine was injected into the anterior chamber.  The anterior chamber was filled with Healon 5 viscoelastic.  A 2.4 millimeter keratome was used to make a near-clear corneal incision at the 8:00 position.  A curvilinear capsulorrhexis was made with a cystotome and capsulorrhexis forceps.  Balanced salt solution was used to hydrodissect and hydrodelineate the nucleus.   Phacoemulsification was then used in stop and chop fashion to remove the lens nucleus and epinucleus.  The remaining  cortex was then removed using the irrigation and aspiration handpiece. Healon was then placed into the capsular bag to distend it for lens placement.  A lens was then injected into the capsular bag.  The remaining viscoelastic was aspirated.   Wounds were hydrated with balanced salt solution.  The anterior chamber was inflated to a physiologic pressure with balanced salt solution.   Intracameral vigamox 0.1 mL undiluted was injected into the eye and a drop placed onto the ocular surface.  No wound leaks were noted.  The patient was taken to the recovery room in stable condition without complications of anesthesia or surgery  Benay Pillow 03/18/2020, 8:43 AM

## 2020-03-18 NOTE — Transfer of Care (Signed)
Immediate Anesthesia Transfer of Care Note  Patient: Alexandra Copeland  Procedure(s) Performed: CATARACT EXTRACTION PHACO AND INTRAOCULAR LENS PLACEMENT (IOC) RIGHT DIABETIC 9.71  01:00.9 (Right Eye)  Patient Location: PACU  Anesthesia Type: MAC  Level of Consciousness: awake, alert  and patient cooperative  Airway and Oxygen Therapy: Patient Spontanous Breathing and Patient connected to supplemental oxygen  Post-op Assessment: Post-op Vital signs reviewed, Patient's Cardiovascular Status Stable, Respiratory Function Stable, Patent Airway and No signs of Nausea or vomiting  Post-op Vital Signs: Reviewed and stable  Complications: No complications documented.

## 2020-03-18 NOTE — Anesthesia Postprocedure Evaluation (Signed)
Anesthesia Post Note  Patient: Alexandra Copeland  Procedure(s) Performed: CATARACT EXTRACTION PHACO AND INTRAOCULAR LENS PLACEMENT (IOC) RIGHT DIABETIC 9.71  01:00.9 (Right Eye)     Patient location during evaluation: PACU Anesthesia Type: MAC Level of consciousness: awake and alert Pain management: pain level controlled Vital Signs Assessment: post-procedure vital signs reviewed and stable Respiratory status: spontaneous breathing, nonlabored ventilation, respiratory function stable and patient connected to nasal cannula oxygen Cardiovascular status: stable and blood pressure returned to baseline Postop Assessment: no apparent nausea or vomiting Anesthetic complications: no   No complications documented.  Miquan Tandon A  Yossef Gilkison

## 2020-03-18 NOTE — Anesthesia Procedure Notes (Signed)
Procedure Name: MAC Date/Time: 03/18/2020 8:23 AM Performed by: Jeannene Patella, CRNA Pre-anesthesia Checklist: Patient identified, Emergency Drugs available, Suction available, Patient being monitored and Timeout performed Patient Re-evaluated:Patient Re-evaluated prior to induction Oxygen Delivery Method: Nasal cannula

## 2020-03-18 NOTE — Anesthesia Preprocedure Evaluation (Signed)
Anesthesia Evaluation  Patient identified by MRN, date of birth, ID band Patient awake    Reviewed: Allergy & Precautions, H&P , NPO status , Patient's Chart, lab work & pertinent test results, reviewed documented beta blocker date and time   History of Anesthesia Complications Negative for: history of anesthetic complications  Airway Mallampati: II  TM Distance: >3 FB Neck ROM: full    Dental no notable dental hx.    Pulmonary Current Smoker and Patient abstained from smoking.,    Pulmonary exam normal breath sounds clear to auscultation       Cardiovascular Exercise Tolerance: Good hypertension, (-) angina(-) DOE  Rhythm:regular Rate:Normal   HLD   Neuro/Psych Depression CVA, No Residual Symptoms    GI/Hepatic neg GERD  ,  Endo/Other  diabetes, Type 2Hypothyroidism   Renal/GU      Musculoskeletal   Abdominal   Peds  Hematology negative hematology ROS (+)   Anesthesia Other Findings   Reproductive/Obstetrics                             Anesthesia Physical  Anesthesia Plan  ASA: III  Anesthesia Plan: MAC   Post-op Pain Management:    Induction: Intravenous  PONV Risk Score and Plan: 1 and Treatment may vary due to age or medical condition  Airway Management Planned: Nasal Cannula  Additional Equipment:   Intra-op Plan:   Post-operative Plan:   Informed Consent: I have reviewed the patients History and Physical, chart, labs and discussed the procedure including the risks, benefits and alternatives for the proposed anesthesia with the patient or authorized representative who has indicated his/her understanding and acceptance.     Dental Advisory Given  Plan Discussed with: CRNA  Anesthesia Plan Comments:         Anesthesia Quick Evaluation

## 2020-03-18 NOTE — H&P (Signed)

## 2020-03-19 ENCOUNTER — Encounter: Payer: Self-pay | Admitting: Ophthalmology

## 2020-04-05 ENCOUNTER — Other Ambulatory Visit: Payer: Self-pay | Admitting: Internal Medicine

## 2020-04-18 ENCOUNTER — Other Ambulatory Visit: Payer: Self-pay | Admitting: Internal Medicine

## 2020-04-29 ENCOUNTER — Other Ambulatory Visit: Payer: Self-pay | Admitting: Internal Medicine

## 2020-05-10 ENCOUNTER — Other Ambulatory Visit: Payer: Self-pay | Admitting: Internal Medicine

## 2020-05-10 DIAGNOSIS — E034 Atrophy of thyroid (acquired): Secondary | ICD-10-CM

## 2020-07-01 ENCOUNTER — Ambulatory Visit: Payer: Medicare Other | Attending: Internal Medicine

## 2020-07-01 DIAGNOSIS — Z23 Encounter for immunization: Secondary | ICD-10-CM

## 2020-07-01 NOTE — Progress Notes (Signed)
   Covid-19 Vaccination Clinic  Name:  AYO GUARINO    MRN: [id number] DOB: 14-Feb-1946  07/01/2020  Ms. Witkop was observed post Covid-19 immunization for 15 minutes without incident. She was provided with Vaccine Information Sheet and instruction to access the V-Safe system.   Ms. Roesler was instructed to call 911 with any severe reactions post vaccine: Marland Kitchen Difficulty breathing  . Swelling of face and throat  . A fast heartbeat  . A bad rash all over body  . Dizziness and weakness   Immunizations Administered    No immunizations on file.

## 2020-07-08 ENCOUNTER — Other Ambulatory Visit: Payer: Self-pay | Admitting: Internal Medicine

## 2020-07-24 ENCOUNTER — Other Ambulatory Visit: Payer: Self-pay | Admitting: Internal Medicine

## 2020-08-06 ENCOUNTER — Other Ambulatory Visit: Payer: Self-pay | Admitting: Internal Medicine

## 2020-08-14 ENCOUNTER — Other Ambulatory Visit: Payer: Self-pay | Admitting: Internal Medicine

## 2020-08-14 DIAGNOSIS — E034 Atrophy of thyroid (acquired): Secondary | ICD-10-CM

## 2020-08-28 ENCOUNTER — Other Ambulatory Visit: Payer: Self-pay | Admitting: Internal Medicine

## 2020-08-28 DIAGNOSIS — Z1231 Encounter for screening mammogram for malignant neoplasm of breast: Secondary | ICD-10-CM

## 2020-09-03 ENCOUNTER — Encounter: Payer: Self-pay | Admitting: *Deleted

## 2020-09-16 ENCOUNTER — Other Ambulatory Visit: Payer: Self-pay

## 2020-09-16 ENCOUNTER — Encounter: Payer: Self-pay | Admitting: Internal Medicine

## 2020-09-16 ENCOUNTER — Ambulatory Visit (INDEPENDENT_AMBULATORY_CARE_PROVIDER_SITE_OTHER): Payer: Medicare HMO | Admitting: Internal Medicine

## 2020-09-16 VITALS — BP 160/74 | HR 90 | Temp 90.0°F | Ht 67.01 in | Wt 193.5 lb

## 2020-09-16 DIAGNOSIS — E119 Type 2 diabetes mellitus without complications: Secondary | ICD-10-CM

## 2020-09-16 DIAGNOSIS — F325 Major depressive disorder, single episode, in full remission: Secondary | ICD-10-CM | POA: Diagnosis not present

## 2020-09-16 DIAGNOSIS — E034 Atrophy of thyroid (acquired): Secondary | ICD-10-CM | POA: Diagnosis not present

## 2020-09-16 DIAGNOSIS — E782 Mixed hyperlipidemia: Secondary | ICD-10-CM

## 2020-09-16 DIAGNOSIS — Z716 Tobacco abuse counseling: Secondary | ICD-10-CM | POA: Diagnosis not present

## 2020-09-16 DIAGNOSIS — Z8673 Personal history of transient ischemic attack (TIA), and cerebral infarction without residual deficits: Secondary | ICD-10-CM | POA: Diagnosis not present

## 2020-09-16 DIAGNOSIS — I10 Essential (primary) hypertension: Secondary | ICD-10-CM

## 2020-09-16 MED ORDER — CELECOXIB 200 MG PO CAPS
200.0000 mg | ORAL_CAPSULE | Freq: Every day | ORAL | 0 refills | Status: DC | PRN
Start: 2020-09-16 — End: 2021-09-19

## 2020-09-16 MED ORDER — TETANUS-DIPHTH-ACELL PERTUSSIS 5-2.5-18.5 LF-MCG/0.5 IM SUSY
0.5000 mL | PREFILLED_SYRINGE | Freq: Once | INTRAMUSCULAR | 0 refills | Status: AC
Start: 2020-09-16 — End: 2020-09-16

## 2020-09-16 NOTE — Patient Instructions (Addendum)
I am prescribing celebrex  To use instead of advil for the mouth pain.  One tablet/capsule  daily  You can add up to 2000 mg of acetominophen (tylenol) every day safely  In divided doses (500 mg every 6 hours  Or 1000 mg every 12 hours.)    PLEASE PLEASE PLEASE consider wellbutrin to help you quit smoking!!!

## 2020-09-16 NOTE — Assessment & Plan Note (Addendum)
Risks of continued tobacco use were discussed. She is not currently interested in tobacco cessation but contemplative of trying  wellbutrin
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
## 2020-09-17 DIAGNOSIS — R69 Illness, unspecified: Secondary | ICD-10-CM | POA: Diagnosis not present

## 2020-09-17 LAB — COMPREHENSIVE METABOLIC PANEL
ALT: 22 U/L (ref 0–35)
AST: 26 U/L (ref 0–37)
Albumin: 4.4 g/dL (ref 3.5–5.2)
Alkaline Phosphatase: 93 U/L (ref 39–117)
BUN: 20 mg/dL (ref 6–23)
CO2: 32 mEq/L (ref 19–32)
Calcium: 10 mg/dL (ref 8.4–10.5)
Chloride: 106 mEq/L (ref 96–112)
Creatinine, Ser: 0.81 mg/dL (ref 0.40–1.20)
GFR: 71.8 mL/min (ref >60.00)
Glucose, Bld: 97 mg/dL (ref 70–99)
Potassium: 4 mEq/L (ref 3.5–5.1)
Sodium: 145 mEq/L (ref 135–145)
Total Bilirubin: 0.5 mg/dL (ref 0.2–1.2)
Total Protein: 6.7 g/dL (ref 6.0–8.3)

## 2020-09-17 LAB — LIPID PANEL
Cholesterol: 158 mg/dL (ref 0–200)
HDL: 44.9 mg/dL (ref >39.00)
NonHDL: 113.31
Total CHOL/HDL Ratio: 4
Triglycerides: 210 mg/dL — ABNORMAL HIGH (ref 0.0–149.0)
VLDL: 42 mg/dL — ABNORMAL HIGH (ref 0.0–40.0)

## 2020-09-17 LAB — MICROALBUMIN / CREATININE URINE RATIO
Creatinine,U: 133.5 mg/dL
Microalb Creat Ratio: 1.1 mg/g (ref 0.0–30.0)
Microalb, Ur: 1.5 mg/dL (ref 0.0–1.9)

## 2020-09-17 LAB — LDL CHOLESTEROL, DIRECT: Direct LDL: 91 mg/dL

## 2020-09-17 LAB — TSH: TSH: 1.75 u[IU]/mL (ref 0.35–4.50)

## 2020-09-17 LAB — HEMOGLOBIN A1C: Hgb A1c MFr Bld: 6.2 % (ref 4.6–6.5)

## 2020-09-17 NOTE — Assessment & Plan Note (Signed)
Well controlled on current regimen of amlodipine , hctz and losartan. . Renal function stable, no changes today.  Lab Results  Component Value Date   CREATININE 0.81 03/14/2020   Lab Results  Component Value Date   NA 143 03/14/2020   K 3.7 03/14/2020   CL 105 03/14/2020   CO2 29 03/14/2020   Lab Results  Component Value Date   MICROALBUR 2.0 (H) 03/14/2020   MICROALBUR 1.4 02/22/2019

## 2020-09-17 NOTE — Assessment & Plan Note (Signed)
No residual symptoms.  Continue ASA and statin for secondary prevention.  BP at goal.  STRONGLY URGED to quit smoking

## 2020-09-17 NOTE — Assessment & Plan Note (Signed)
She reports increased loneliness due to [place] confinement.  Philosophic discussion today regarding the impact of COVID on her life. . She is fully vaccinated;  encouraged to stop letting COVID fear mongering rhetoric keep her from enjoying the social interaction she craves.

## 2020-09-17 NOTE — Assessment & Plan Note (Signed)
LDL is < 100 on atorvastatin and LFTs are normal  Lab Results  Component Value Date   CHOL 170 03/14/2020   HDL 40.70 03/14/2020   LDLCALC 94 03/14/2020   LDLDIRECT 114.0 08/05/2017   TRIG 174.0 (H) 03/14/2020   CHOLHDL 4 03/14/2020

## 2020-09-17 NOTE — Assessment & Plan Note (Signed)
Historically well-controlled on diet alone  . Patient is reminded to schedule an annual eye exam and foot exam is normal today. Patient has minimal microalbuminuria. Patient is tolerating statin therapy for CAD risk reduction and on ACE/ARB for renal protection and hypertension .  Encouraged to continue daily exercise and follow low GI diet. .  There is a strong  FH of type 2 DM.  Lab Results  Component Value Date   HGBA1C 6.2 03/14/2020   Lab Results  Component Value Date   MICROALBUR 2.0 (H) 03/14/2020

## 2020-09-19 DIAGNOSIS — R69 Illness, unspecified: Secondary | ICD-10-CM | POA: Diagnosis not present

## 2020-10-04 ENCOUNTER — Other Ambulatory Visit: Payer: Self-pay

## 2020-10-04 ENCOUNTER — Ambulatory Visit
Admission: RE | Admit: 2020-10-04 | Discharge: 2020-10-04 | Disposition: A | Discharge status: Home / Self Care | Payer: Medicare HMO | Source: Ambulatory Visit | Attending: Internal Medicine | Admitting: Internal Medicine

## 2020-10-04 DIAGNOSIS — Z1231 Encounter for screening mammogram for malignant neoplasm of breast: Secondary | ICD-10-CM | POA: Diagnosis not present

## 2020-10-14 ENCOUNTER — Other Ambulatory Visit: Payer: Self-pay | Admitting: Internal Medicine

## 2020-10-21 ENCOUNTER — Other Ambulatory Visit: Payer: Self-pay | Admitting: Internal Medicine

## 2020-11-05 ENCOUNTER — Other Ambulatory Visit: Payer: Self-pay | Admitting: Internal Medicine

## 2020-11-25 ENCOUNTER — Other Ambulatory Visit: Payer: Self-pay | Admitting: Internal Medicine

## 2020-11-25 DIAGNOSIS — E034 Atrophy of thyroid (acquired): Secondary | ICD-10-CM

## 2021-01-10 ENCOUNTER — Other Ambulatory Visit: Payer: Self-pay | Admitting: Internal Medicine

## 2021-01-23 DIAGNOSIS — H26493 Other secondary cataract, bilateral: Secondary | ICD-10-CM | POA: Diagnosis not present

## 2021-01-23 DIAGNOSIS — Z0100 Encounter for examination of eyes and vision without abnormal findings: Secondary | ICD-10-CM | POA: Diagnosis not present

## 2021-01-23 LAB — HM DIABETES EYE EXAM

## 2021-02-03 ENCOUNTER — Other Ambulatory Visit: Payer: Self-pay | Admitting: Internal Medicine

## 2021-02-23 ENCOUNTER — Other Ambulatory Visit: Payer: Self-pay | Admitting: Internal Medicine

## 2021-02-23 DIAGNOSIS — E034 Atrophy of thyroid (acquired): Secondary | ICD-10-CM

## 2021-02-24 ENCOUNTER — Ambulatory Visit: Payer: PRIVATE HEALTH INSURANCE

## 2021-03-11 ENCOUNTER — Ambulatory Visit (INDEPENDENT_AMBULATORY_CARE_PROVIDER_SITE_OTHER): Payer: Medicare HMO

## 2021-03-11 VITALS — Ht 67.01 in | Wt 182.0 lb

## 2021-03-11 DIAGNOSIS — Z0000 Encounter for general adult medical examination without abnormal findings: Secondary | ICD-10-CM

## 2021-03-11 NOTE — Patient Instructions (Addendum)
Ms. Alexandra Copeland , Thank you for taking time to come for your Medicare Wellness Visit. I appreciate your ongoing commitment to your health goals. Please review the following plan we discussed and let me know if I can assist you in the future.   These are the goals we discussed:  Goals       Patient Stated     Quit Smoking (pt-stated)      Follow up with pcp      Other     Weight goal 140lb-145lb      Stay hydrated and drink plenty of fluids. Low carb foods.  Lean meats and vegetables. Stay active.        This is a list of the screening recommended for you and due dates:  Health Maintenance  Topic Date Due   Flu Shot  03/10/2021   COVID-19 Vaccine (4 - Booster for Moderna series) 03/27/2021*   Zoster (Shingles) Vaccine (1 of 2) 06/11/2021*   Tetanus Vaccine  03/11/2022*   Complete foot exam   03/14/2021   Hemoglobin A1C  03/16/2021   Cologuard (Stool DNA test)  09/02/2021   Mammogram  10/04/2021   Eye exam for diabetics  01/23/2022   DEXA scan (bone density measurement)  Completed   Hepatitis C Screening: USPSTF Recommendation to screen - Ages 80-79 yo.  Completed   Pneumonia vaccines  Completed   HPV Vaccine  Aged Out  *Topic was postponed. The date shown is not the original due date.    Advanced directives: not yet completed  Conditions/risks identified: none new  Follow up in one year for your annual wellness visit    Preventive Care 65 Years and Older, Female Preventive care refers to lifestyle choices and visits with your health care provider that can promote health and wellness. What does preventive care include? A yearly physical exam. This is also called an annual well check. Dental exams once or twice a year. Routine eye exams. Ask your health care provider how often you should have your eyes checked. Personal lifestyle choices, including: Daily care of your teeth and gums. Regular physical activity. Eating a healthy diet. Avoiding tobacco and drug  use. Limiting alcohol use. Practicing safe sex. Taking low-dose aspirin every day. Taking vitamin and mineral supplements as recommended by your health care provider. What happens during an annual well check? The services and screenings done by your health care provider during your annual well check will depend on your age, overall health, lifestyle risk factors, and family history of disease. Counseling  Your health care provider may ask you questions about your: Alcohol use. Tobacco use. Drug use. Emotional well-being. Home and relationship well-being. Sexual activity. Eating habits. History of falls. Memory and ability to understand (cognition). Work and work Statistician. Reproductive health. Screening  You may have the following tests or measurements: Height, weight, and BMI. Blood pressure. Lipid and cholesterol levels. These may be checked every 5 years, or more frequently if you are over 57 years old. Skin check. Lung cancer screening. You may have this screening every year starting at age 45 if you have a 30-pack-year history of smoking and currently smoke or have quit within the past 15 years. Fecal occult blood test (FOBT) of the stool. You may have this test every year starting at age 2. Flexible sigmoidoscopy or colonoscopy. You may have a sigmoidoscopy every 5 years or a colonoscopy every 10 years starting at age 73. Hepatitis C blood test. Hepatitis B blood test. Sexually transmitted disease (  STD) testing. Diabetes screening. This is done by checking your blood sugar (glucose) after you have not eaten for a while (fasting). You may have this done every 1-3 years. Bone density scan. This is done to screen for osteoporosis. You may have this done starting at age 52. Mammogram. This may be done every 1-2 years. Talk to your health care provider about how often you should have regular mammograms. Talk with your health care provider about your test results, treatment  options, and if necessary, the need for more tests. Vaccines  Your health care provider may recommend certain vaccines, such as: Influenza vaccine. This is recommended every year. Tetanus, diphtheria, and acellular pertussis (Tdap, Td) vaccine. You may need a Td booster every 10 years. Zoster vaccine. You may need this after age 47. Pneumococcal 13-valent conjugate (PCV13) vaccine. One dose is recommended after age 51. Pneumococcal polysaccharide (PPSV23) vaccine. One dose is recommended after age 36. Talk to your health care provider about which screenings and vaccines you need and how often you need them. This information is not intended to replace advice given to you by your health care provider. Make sure you discuss any questions you have with your health care provider. Document Released: 08/23/2015 Document Revised: 04/15/2016 Document Reviewed: 05/28/2015 Elsevier Interactive Patient Education  2017 [place] Prevention in the Home Falls can cause injuries. They can happen to people of all ages. There are many things you can do to make your home safe and to help prevent falls. What can I do on the outside of my home? Regularly fix the edges of walkways and driveways and fix any cracks. Remove anything that might make you trip as you walk through a door, such as a raised step or threshold. Trim any bushes or trees on the path to your home. Use bright outdoor lighting. Clear any walking paths of anything that might make someone trip, such as rocks or tools. Regularly check to see if handrails are loose or broken. Make sure that both sides of any steps have handrails. Any raised decks and porches should have guardrails on the edges. Have any leaves, snow, or ice cleared regularly. Use sand or salt on walking paths during winter. Clean up any spills in your garage right away. This includes oil or grease spills. What can I do in the bathroom? Use night lights. Install grab  bars by the toilet and in the tub and shower. Do not use towel bars as grab bars. Use non-skid mats or decals in the tub or shower. If you need to sit down in the shower, use a plastic, non-slip stool. Keep the floor dry. Clean up any water that spills on the floor as soon as it happens. Remove soap buildup in the tub or shower regularly. Attach bath mats securely with double-sided non-slip rug tape. Do not have throw rugs and other things on the floor that can make you trip. What can I do in the bedroom? Use night lights. Make sure that you have a light by your bed that is easy to reach. Do not use any sheets or blankets that are too big for your bed. They should not hang down onto the floor. Have a firm chair that has side arms. You can use this for support while you get dressed. Do not have throw rugs and other things on the floor that can make you trip. What can I do in the kitchen? Clean up any spills right away. Avoid walking on  wet floors. Keep items that you use a lot in easy-to-reach places. If you need to reach something above you, use a strong step stool that has a grab bar. Keep electrical cords out of the way. Do not use floor polish or wax that makes floors slippery. If you must use wax, use non-skid floor wax. Do not have throw rugs and other things on the floor that can make you trip. What can I do with my stairs? Do not leave any items on the stairs. Make sure that there are handrails on both sides of the stairs and use them. Fix handrails that are broken or loose. Make sure that handrails are as long as the stairways. Check any carpeting to make sure that it is firmly attached to the stairs. Fix any carpet that is loose or worn. Avoid having throw rugs at the top or bottom of the stairs. If you do have throw rugs, attach them to the floor with carpet tape. Make sure that you have a light switch at the top of the stairs and the bottom of the stairs. If you do not have them,  ask someone to add them for you. What else can I do to help prevent falls? Wear shoes that: Do not have high heels. Have rubber bottoms. Are comfortable and fit you well. Are closed at the toe. Do not wear sandals. If you use a stepladder: Make sure that it is fully opened. Do not climb a closed stepladder. Make sure that both sides of the stepladder are locked into place. Ask someone to hold it for you, if possible. Clearly mark and make sure that you can see: Any grab bars or handrails. First and last steps. Where the edge of each step is. Use tools that help you move around (mobility aids) if they are needed. These include: Canes. Walkers. Scooters. Crutches. Turn on the lights when you go into a dark area. Replace any light bulbs as soon as they burn out. Set up your furniture so you have a clear path. Avoid moving your furniture around. If any of your floors are uneven, fix them. If there are any pets around you, be aware of where they are. Review your medicines with your doctor. Some medicines can make you feel dizzy. This can increase your chance of falling. Ask your doctor what other things that you can do to help prevent falls. This information is not intended to replace advice given to you by your health care provider. Make sure you discuss any questions you have with your health care provider. Document Released: 05/23/2009 Document Revised: 01/02/2016 Document Reviewed: 08/31/2014 Elsevier Interactive Patient Education  2017 [employer].

## 2021-03-11 NOTE — Progress Notes (Signed)
Subjective:   Alexandra Copeland is a 75 y.o. female who presents for Medicare Annual (Subsequent) preventive examination.  Review of Systems    No ROS.  Medicare Wellness Virtual Visit.  Visual/audio telehealth visit, UTA vital signs.   See social history for additional risk factors.   Cardiac Risk Factors include: advanced age (>41mn, >>83women)     Objective:    Today's Vitals   03/11/21 1039  Weight: 182 lb (82.6 kg)  Height: 5' 7.01" (1.702 m)   Body mass index is 28.5 kg/m.  Advanced Directives 03/11/2021 03/18/2020 02/22/2020 02/20/2020 02/21/2019 12/16/2017 12/10/2016  Does Patient Have a Medical Advance Directive? No No No No Yes Yes Yes  Type of Advance Directive - - - - HPress photographerLiving will HReserveLiving will HTraskwoodLiving will  Does patient want to make changes to medical advance directive? - - - - No - Patient declined No - Patient declined No - Patient declined  [place] of [place]? - - - - No - copy requested No - copy requested No - copy requested  Would patient like information on creating a medical advance directive? No - Patient declined No - Patient declined No - Patient declined No - Patient declined - - -    Current Medications (verified) Outpatient Encounter Medications as of 03/11/2021  Medication Sig   amLODipine (NORVASC) 2.5 MG tablet Take 1 tablet by mouth once daily   aspirin 325 MG tablet Take 325 mg by mouth daily.   atorvastatin (LIPITOR) 80 MG tablet Take 1 tablet by mouth once daily   celecoxib (CELEBREX) 200 MG capsule Take 1 capsule (200 mg total) by mouth daily as needed.   cholecalciferol (VITAMIN D) 1000 UNITS tablet Take 1,000 Units by mouth daily.   EUTHYROX 88 MCG tablet TAKE 1 TABLET BY MOUTH ONCE DAILY BEFORE BREAKFAST   famotidine (PEPCID) 20 MG tablet Take 20 mg by mouth daily.   Glucosamine Sulfate Potassium POWD 1,000 mg 2 (two) times daily.    hydrochlorothiazide (HYDRODIURIL) 25 MG tablet Take 1 tablet by mouth once daily   losartan (COZAAR) 100 MG tablet Take 1 tablet by mouth once daily   Multiple Vitamin (MULTIVITAMIN) tablet Take 1 tablet by mouth daily.   nicotine (NICODERM CQ - DOSED IN MG/24 HOURS) 14 mg/24hr patch Place 14 mg onto the skin daily as needed.   NYSTATIN powder APPLY ONE APPLICATION TWICE DAILY TO RASH UNTIL RESOLVED   triamcinolone cream (KENALOG) 0.1 % Apply 1 application topically 2 (two) times daily.   No facility-administered encounter medications on file as of 03/11/2021.    Allergies (verified) Patient has no known allergies.   History: Past Medical History:  Diagnosis Date   Hyperlipidemia    Hypertension    Hypothyroid    Scarlet fever    as a child   Stroke ([facility] June 2010   left parietal hypertensive   Past Surgical History:  Procedure Laterality Date   BREAST CYST ASPIRATION Left 1978   neg   BREAST SURGERY  1975   abscess drained   CATARACT EXTRACTION W/PHACO Left 02/20/2020   Procedure: CATARACT EXTRACTION PHACO AND INTRAOCULAR LENS PLACEMENT (IOC) LEFT DIABETIC 6.77 00:41.8;  Surgeon: KEulogio Bear MD;  Location[facility]  Service: Ophthalmology;  Laterality: Left;   CATARACT EXTRACTION W/PHACO Right 03/18/2020   Procedure: CATARACT EXTRACTION PHACO AND INTRAOCULAR LENS PLACEMENT (IOC) RIGHT DIABETIC 9.71  01:00.9;  Surgeon:  [name], MD;  Location: [place];  Service: Ophthalmology;  Laterality: Right;   TUBAL LIGATION     Family History  Problem Relation Age of Onset   Heart disease Mother    Hypertension Mother    Stroke Father    Hyperlipidemia Father    Vision loss Father    Hypertension Brother    Rheum arthritis Brother    Diabetes Paternal Grandmother    Breast cancer Neg Hx    Social History   Socioeconomic History   Marital status: Single    Spouse name: Not on file   Number of children: Not on file   Years of education:  Not on file   Highest education level: Not on file  Occupational History   Not on file  Tobacco Use   Smoking status: Every Day    Packs/day: 1.00    Years: 26.00    Pack years: 26.00    Types: Cigarettes    Start date: 05/10/2018   Smokeless tobacco: Never   Tobacco comments:    quit for several years.  Has recently started back.  Vaping Use   Vaping Use: Never used  Substance and Sexual Activity   Alcohol use: Yes    Alcohol/week: 1.0 standard drink    Types: 1 Glasses of wine per week    Comment: OCC glass of wine   Drug use: No   Sexual activity: Never  Other Topics Concern   Not on file  Social History Narrative   Not on file   Social Determinants of Health   Financial Resource Strain: Low Risk    Difficulty of Paying Living Expenses: Not hard at all  Food Insecurity: No Food Insecurity   Worried About Charity fundraiser in the Last Year: Never true   Ran Out of Food in the Last Year: Never true  Transportation Needs: No Transportation Needs   Lack of Transportation (Medical): No   Lack of Transportation (Non-Medical): No  Physical Activity: Sufficiently Active   Days of Exercise per Week: 7 days   Minutes of Exercise per Session: 30 min  Stress: No Stress Concern Present   Feeling of Stress : Not at all  Social Connections: Unknown   Frequency of Communication with Friends and Family: Not on file   Frequency of Social Gatherings with Friends and Family: More than three times a week   Attends Religious Services: Not on Electrical engineer or Organizations: Not on file   Attends Archivist Meetings: Not on file   Marital Status: Not on file    Tobacco Counseling Ready to quit: Not Answered Counseling given: Not Answered Tobacco comments: quit for several years.  Has recently started back.   Clinical Intake:  Pre-visit preparation completed: Yes    Nutrition Risk Assessment: Has the patient had any N/V/D within the last 2 months?   No  Does the patient have any non-healing wounds?  No  Has the patient had any unintentional weight loss or weight gain?  No   Diabetes: If diabetic, was a CBG obtained today?  No  Did the patient bring in their glucometer from home?  No  How often do you monitor your CBG's? In office.   Financial Strains and Diabetes Management: Are you having any financial strains with the device, your supplies or your medication? No .  Does the patient want to be seen by Chronic Care Management for management of their diabetes?  No  Would the patient like to be referred to a Nutritionist or for Diabetic Management?  No   Diabetes:  (Diabetes ([place]))  How often do you need to have someone help you when you read instructions, pamphlets, or other written materials from your doctor or pharmacy?: 1 - Never  Interpreter Needed?: No      Activities of Daily Living In your present state of health, do you have any difficulty performing the following activities: 03/11/2021 03/18/2020  Hearing? N N  Vision? N N  Difficulty concentrating or making decisions? N N  Walking or climbing stairs? N N  Dressing or bathing? N N  Doing errands, shopping? N -  Preparing Food and eating ? N -  Using the Toilet? N -  In the past six months, have you accidently leaked urine? N -  Do you have problems with loss of bowel control? N -  Managing your Medications? N -  Managing your Finances? N -  Comment Son assists as needed -  Housekeeping or managing your Housekeeping? N -  Some recent data might be hidden    Patient Care Team: [name], MD as PCP - General (Internal Medicine)  Indicate any recent Medical Services you may have received from other than [facility] providers in the past year (date may be approximate).     Assessment:   This is a routine wellness examination for Sisters.  I connected with Ezmeralda today by telephone and verified that I am speaking with the correct person using two  identifiers. Location patient: home Location provider: work Persons participating in the virtual visit: patient, Marine scientist.    I discussed the limitations, risks, security and privacy concerns of performing an evaluation and management service by telephone and the availability of in person appointments. The patient expressed understanding and verbally consented to this telephonic visit.    Interactive audio and video telecommunications were attempted between this provider and patient, however failed, due to patient having technical difficulties OR patient did not have access to video capability.  We continued and completed visit with audio only.  Some vital signs may be absent or patient reported.   Hearing/Vision screen Hearing Screening - Comments:: Patient is able to hear conversational tones without difficulty.  No issues reported. Vision Screening - Comments:: Wears corrective lenses when reading.  They have seen their ophthalmologist in the last 12 months.   Dietary issues and exercise activities discussed: Current Exercise Habits: Home exercise routine, Type of exercise: walking, Time (Minutes): 30, Frequency (Times/Week): 7, Weekly Exercise (Minutes/Week): 210, Intensity: Mild Regular diet Good water intake   Goals Addressed             This Visit's Progress    Weight goal 140lb-145lb       Stay hydrated and drink plenty of fluids. Low carb foods.  Lean meats and vegetables. Stay active.       Depression Screen PHQ 2/9 Scores 03/11/2021 09/16/2020 02/22/2020 02/21/2019 12/16/2017 12/10/2016 12/11/2015  PHQ - 2 Score 0 0 0 0 0 0 0  PHQ- 9 Score - - - - - 0 -    Fall Risk Fall Risk  03/11/2021 09/16/2020 03/14/2020 02/22/2020 07/05/2019  Falls in the past year? 0 1 0 0 0  Comment - - - - Emmi Telephone Survey: data to providers prior to load  Number falls in past yr: 0 0 - 0 -  Injury with Fall? 0 0 - - -  Follow up Falls evaluation completed  Falls evaluation completed Falls evaluation  completed Falls evaluation completed -    FALL RISK PREVENTION PERTAINING TO THE HOME: Adequate lighting in your home to reduce risk of falls? Yes   ASSISTIVE DEVICES UTILIZED TO PREVENT FALLS: Use of a cane, walker or w/c? No   TIMED UP AND GO: Was the test performed? No .   Cognitive Function: Patient is alert and oriented x3.  Enjoys Bible study, electronic brain health games and brain health.  MMSE/6CIT deferred. Normal by direct communication/observation.  MMSE - Mini Mental State Exam 02/22/2020 12/16/2017 12/10/2016 12/11/2015  Not completed: Unable to complete - - -  Orientation to time - '5 5 5  '$ Orientation to Place - '5 5 5  '$ Registration - '3 3 3  '$ Attention/ Calculation - '5 5 5  '$ Recall - '3 3 3  '$ Language- name 2 objects - '2 2 2  '$ Language- repeat - '1 1 1  '$ Language- follow 3 step command - '3 3 3  '$ Language- read & follow direction - '1 1 1  '$ Write a sentence - '1 1 1  '$ Copy design - '1 1 1  '$ Total score - '30 30 30     '$ 6CIT Screen 02/21/2019  What Year? 0 points  What month? 0 points  What time? 0 points  Count back from 20 0 points  Months in reverse 0 points  Repeat phrase 0 points  Total Score 0    Immunizations Immunization History  Administered Date(s) Administered   Influenza, High Dose Seasonal PF 08/11/2018   Influenza,inj,Quad PF,6+ Mos 05/24/2013, 04/30/2014   Moderna SARS-COV2 Booster Vaccination 07/01/2020   Moderna Sars-Covid-2 Vaccination 10/02/2019, 10/31/2019   PPD Test 09/18/2014   Pneumococcal Conjugate-13 05/28/2014   Pneumococcal Polysaccharide-23 05/29/2010, 08/18/2018   Tdap 05/28/2010    TDAP status: Due, Education has been provided regarding the importance of this vaccine. Advised may receive this vaccine at local pharmacy or Health Dept. Aware to provide a copy of the vaccination record if obtained from local pharmacy or Health Dept. Verbalized acceptance and understanding. Deferred.   Shingrix vaccine- Due, Education has been provided regarding  the importance of this vaccine. Advised may receive this vaccine at local pharmacy or Health Dept. Aware to provide a copy of the vaccination record if obtained from local pharmacy or Health Dept. Verbalized acceptance and understanding. Deferred.   Covid vaccine- completed 3 vaccines.  Health Maintenance Health Maintenance  Topic Date Due   INFLUENZA VACCINE  03/10/2021   COVID-19 Vaccine (4 - Booster for Moderna series) 03/27/2021 (Originally 10/29/2020)   Zoster Vaccines- Shingrix (1 of 2) 06/11/2021 (Originally 11/10/1996)   TETANUS/TDAP  03/11/2022 (Originally 05/28/2020)   FOOT EXAM  03/14/2021   HEMOGLOBIN A1C  03/16/2021   Fecal DNA (Cologuard)  09/02/2021   MAMMOGRAM  10/04/2021   OPHTHALMOLOGY EXAM  01/23/2022   DEXA SCAN  Completed   Hepatitis C Screening  Completed   PNA vac Low Risk Adult  Completed   HPV VACCINES  Aged Out   Vision Screening: Recommended annual ophthalmology exams for early detection of glaucoma and other disorders of the eye.  Dental Screening: Recommended annual dental exams for proper oral hygiene.  Community Resource Referral / Chronic Care Management: CRR required this visit?  No   CCM required this visit?  No      Plan:   Keep all routine maintenance appointments.   I have personally reviewed and noted the following in the patient's chart:   Medical and social history Use  of alcohol, tobacco or illicit drugs  Current medications and supplements including opioid prescriptions. Patient is not currently taking opioid.  Functional ability and status Nutritional status Physical activity Advanced directives List of other physicians Hospitalizations, surgeries, and ER visits in previous 12 months Vitals Screenings to include cognitive, depression, and falls Referrals and appointments  In addition, I have reviewed and discussed with patient certain preventive protocols, quality metrics, and best practice recommendations. A written  personalized care plan for preventive services as well as general preventive health recommendations were provided to patient via mychart.     Varney Biles, LPN   [id number]

## 2021-03-17 ENCOUNTER — Ambulatory Visit: Payer: Medicare HMO | Admitting: Internal Medicine

## 2021-03-19 ENCOUNTER — Encounter: Payer: Self-pay | Admitting: Internal Medicine

## 2021-03-19 ENCOUNTER — Ambulatory Visit (INDEPENDENT_AMBULATORY_CARE_PROVIDER_SITE_OTHER): Payer: Medicare HMO | Admitting: Internal Medicine

## 2021-03-19 ENCOUNTER — Other Ambulatory Visit: Payer: Self-pay

## 2021-03-19 VITALS — BP 160/80 | HR 68 | Temp 96.8°F | Resp 15 | Ht 67.0 in | Wt 185.4 lb

## 2021-03-19 DIAGNOSIS — I10 Essential (primary) hypertension: Secondary | ICD-10-CM

## 2021-03-19 DIAGNOSIS — E785 Hyperlipidemia, unspecified: Secondary | ICD-10-CM | POA: Diagnosis not present

## 2021-03-19 DIAGNOSIS — I152 Hypertension secondary to endocrine disorders: Secondary | ICD-10-CM

## 2021-03-19 DIAGNOSIS — E1159 Type 2 diabetes mellitus with other circulatory complications: Secondary | ICD-10-CM | POA: Diagnosis not present

## 2021-03-19 DIAGNOSIS — E119 Type 2 diabetes mellitus without complications: Secondary | ICD-10-CM | POA: Diagnosis not present

## 2021-03-19 DIAGNOSIS — Z716 Tobacco abuse counseling: Secondary | ICD-10-CM

## 2021-03-19 DIAGNOSIS — E1169 Type 2 diabetes mellitus with other specified complication: Secondary | ICD-10-CM

## 2021-03-19 DIAGNOSIS — E559 Vitamin D deficiency, unspecified: Secondary | ICD-10-CM

## 2021-03-19 MED ORDER — VARENICLINE TARTRATE 0.5 MG X 11 & 1 MG X 42 PO MISC: ORAL | 0 refills | Status: DC

## 2021-03-19 MED ORDER — TETANUS-DIPHTH-ACELL PERTUSSIS 5-2.5-18.5 LF-MCG/0.5 IM SUSY
0.5000 mL | PREFILLED_SYRINGE | Freq: Once | INTRAMUSCULAR | 0 refills | Status: AC
Start: 2021-03-19 — End: 2021-03-19

## 2021-03-19 MED ORDER — ZOSTER VAC RECOMB ADJUVANTED 50 MCG/0.5ML IM SUSR
0.5000 mL | Freq: Once | INTRAMUSCULAR | 1 refills | Status: AC
Start: 2021-03-19 — End: 2021-03-19

## 2021-03-19 NOTE — Assessment & Plan Note (Signed)
Not at goal4  . Lab Results  Component Value Date   MICROALBUR 1.5 09/16/2020   MICROALBUR 2.0 (H) 03/14/2020

## 2021-03-19 NOTE — Patient Instructions (Addendum)
Schedule  an RN visit for BP calibration  bring home machine  with you  TODAY!   YOU ARE OVERDUE FOR YOUR TETANUS VACCINE AND YOUR SHINGLES VACCINE   YOU ARE NOT AT RISK FOR MONKEYPOX!!

## 2021-03-19 NOTE — Progress Notes (Signed)
Subjective:  Patient ID: Alexandra Copeland, female    DOB: September 04, 1945  Age: 75 y.o. MRN: [id number]  CC: The primary encounter diagnosis was Type 2 diabetes mellitus with other specified complication, without long-term current use of insulin ([place]). Diagnoses of Hypertension associated with diabetes ([place]), Vitamin D deficiency, Primary hypertension, Hyperlipidemia associated with type 2 diabetes mellitus ([place]), Type 2 diabetes mellitus without complication, without long-term current use of insulin ([place]), and Tobacco abuse counseling were also pertinent to this visit.  HPI SALEISHA OSSMAN presents for follow up on depressive disorder, type 2 dm and new onset hip complaint   This visit occurred during the SARS-CoV-2 public health emergency.  Safety protocols were in place, including screening questions prior to the visit, additional usage of staff PPE, and extensive cleaning of exam room while observing appropriate contact time as indicated for disinfecting solutions.    Depression :  she states that her mood has improved,  depression score is zero, symptoms were transient and brought on by the she felt after family gathering was done. D HTN:  taking amlodipine 2.5 losartan 100 and hctz 25 . Home readings all under [id number] systolic.  Last several office readings elevated  DM type 2:  diet controlled.  has lost ten lbs following a low GI diet. Adherence to diet was aided by extraction of 4 molars in prep for a partial denture ,  requiired soft foods. . Daughter has lost 55 lbs on the [place].  Taking statin  Hypertension: patient checks blood pressure twice weekly at home.  Readings have been for the most part >140/80 at rest . Patient is following a reduced salt diet most days and is taking amlodipine  as prescribed , 2.5 mg daily  .  Tobacco:  has decided to quit. spending $240 /month . Discussed repeat trial of chantix .    Hip issue:  feels like it is "catching " on the lateral side .  Walks  on treadmill daily ,  pushes lawnmower. Taking glucosamine sulfate with potassium, bc couldn't  find "joint juice"    Outpatient Medications Prior to Visit  Medication Sig Dispense Refill   amLODipine (NORVASC) 2.5 MG tablet Take 1 tablet by mouth once daily 90 tablet 0   aspirin 325 MG tablet Take 325 mg by mouth daily.     atorvastatin (LIPITOR) 80 MG tablet Take 1 tablet by mouth once daily 90 tablet 3   celecoxib (CELEBREX) 200 MG capsule Take 1 capsule (200 mg total) by mouth daily as needed. 30 capsule 0   cholecalciferol (VITAMIN D) 1000 UNITS tablet Take 1,000 Units by mouth daily.     EUTHYROX 88 MCG tablet TAKE 1 TABLET BY MOUTH ONCE DAILY BEFORE BREAKFAST 90 tablet 0   famotidine (PEPCID) 20 MG tablet Take 20 mg by mouth daily.     Glucosamine Sulfate Potassium POWD 1,000 mg 2 (two) times daily.     hydrochlorothiazide (HYDRODIURIL) 25 MG tablet Take 1 tablet by mouth once daily 90 tablet 0   losartan (COZAAR) 100 MG tablet Take 1 tablet by mouth once daily 90 tablet 0   Multiple Vitamin (MULTIVITAMIN) tablet Take 1 tablet by mouth daily.     NYSTATIN powder APPLY ONE APPLICATION TWICE DAILY TO RASH UNTIL RESOLVED 15 g 0   triamcinolone cream (KENALOG) 0.1 % Apply 1 application topically 2 (two) times daily. 80 g 0   nicotine (NICODERM CQ - DOSED IN MG/24 HOURS) 14 mg/24hr patch Place 14 mg  onto the skin daily as needed. (Patient not taking: Reported on 03/19/2021)     No facility-administered medications prior to visit.    Review of Systems;  Patient denies headache, fevers, malaise, unintentional weight loss, skin rash, eye pain, sinus congestion and sinus pain, sore throat, dysphagia,  hemoptysis , cough, dyspnea, wheezing, chest pain, palpitations, orthopnea, edema, abdominal pain, nausea, melena, diarrhea, constipation, flank pain, dysuria, hematuria, urinary  Frequency, nocturia, numbness, tingling, seizures,  Focal weakness, Loss of consciousness,  Tremor, insomnia,  depression, anxiety, and suicidal ideation.      Objective:  BP (!) 160/80 (BP Location: Left Arm, Patient Position: Sitting, Cuff Size: Normal)   Pulse 68   Temp (!) 96.8 F (36 C) (Temporal)   Resp 15   Ht '5\' 7"'$  (1.702 m)   Wt 185 lb 6.4 oz (84.1 kg)   SpO2 98%   BMI 29.04 kg/m   BP Readings from Last 3 Encounters:  03/21/21 (!) 146/68  03/19/21 (!) 160/80  09/16/20 (!) 160/74    Wt Readings from Last 3 Encounters:  03/19/21 185 lb 6.4 oz (84.1 kg)  03/11/21 182 lb (82.6 kg)  09/16/20 193 lb 8 oz (87.8 kg)    General appearance: alert, cooperative and appears stated age Ears: normal TM's and external ear canals both ears Throat: lips, mucosa, and tongue normal; teeth and gums normal Neck: no adenopathy, no carotid bruit, supple, symmetrical, trachea midline and thyroid not enlarged, symmetric, no tenderness/mass/nodules Back: symmetric, no curvature. ROM normal. No CVA tenderness. Lungs: clear to auscultation bilaterally Heart: regular rate and rhythm, S1, S2 normal, no murmur, click, rub or gallop Abdomen: soft, non-tender; bowel sounds normal; no masses,  no organomegaly Pulses: 2+ and symmetric Skin: Skin color, texture, turgor normal. No rashes or lesions Lymph nodes: Cervical, supraclavicular, and axillary nodes normal.  Lab Results  Component Value Date   HGBA1C 6.2 03/19/2021   HGBA1C 6.2 09/16/2020   HGBA1C 6.2 03/14/2020    Lab Results  Component Value Date   CREATININE 0.82 03/19/2021   CREATININE 0.81 09/16/2020   CREATININE 0.81 03/14/2020    Lab Results  Component Value Date   WBC 6.5 03/14/2020   HGB 15.1 (H) 03/14/2020   HCT 43.0 03/14/2020   PLT 220.0 03/14/2020   GLUCOSE 84 03/19/2021   CHOL 173 03/19/2021   TRIG 233.0 (H) 03/19/2021   HDL 43.60 03/19/2021   LDLDIRECT 103.0 03/19/2021   LDLCALC 94 03/14/2020   ALT 22 03/19/2021   AST 29 03/19/2021   NA 142 03/19/2021   K 3.5 03/19/2021   CL 101 03/19/2021   CREATININE 0.82  03/19/2021   BUN 10 03/19/2021   CO2 29 03/19/2021   TSH 1.75 09/16/2020   HGBA1C 6.2 03/19/2021   MICROALBUR 1.5 09/16/2020    MM 3D SCREEN BREAST BILATERAL  Result Date: 10/08/2020 CLINICAL DATA:  Screening. EXAM: DIGITAL SCREENING BILATERAL MAMMOGRAM WITH TOMOSYNTHESIS AND CAD TECHNIQUE: Bilateral screening digital craniocaudal and mediolateral oblique mammograms were obtained. Bilateral screening digital breast tomosynthesis was performed. The images were evaluated with computer-aided detection. COMPARISON:  Previous exam(s). ACR Breast Density Category b: There are scattered areas of fibroglandular density. FINDINGS: There are no findings suspicious for malignancy. The images were evaluated with computer-aided detection. IMPRESSION: No mammographic evidence of malignancy. A result letter of this screening mammogram will be mailed directly to the patient. RECOMMENDATION: Screening mammogram in one year. (Code:SM-B-01Y) BI-RADS CATEGORY  1: Negative. Electronically Signed   By: Dedra Skeens.D.  On: 10/08/2020 10:47    Assessment & Plan:   Problem List Items Addressed This Visit       Unprioritized   Hypertension    Not at goal on losartan 100 mg , hctz 25 mg and amlodipine 2.5 mg daily  Advised to bring home machine in for calibration,  Will need to  increase amlodipine to 5 mg daily  Lab Results  Component Value Date   CREATININE 0.82 03/19/2021   Lab Results  Component Value Date   NA 142 03/19/2021   K 3.5 03/19/2021   CL 101 03/19/2021   CO2 29 03/19/2021         Hyperlipidemia associated with type 2 diabetes mellitus ([place])    LDL is < 100 on atorvastatin and LFTs are normal.  No changes today  Lab Results  Component Value Date   CHOL 173 03/19/2021   HDL 43.60 03/19/2021   LDLCALC 94 03/14/2020   LDLDIRECT 103.0 03/19/2021   TRIG 233.0 (H) 03/19/2021   CHOLHDL 4 03/19/2021         Tobacco abuse counseling    Risks of continued tobacco use were discussed.  She is  currently interested in tobacco cessation and is willing to restart Chantix.  3 minutes of counselling given ;regarding the way chanitx works,  The need to continue medication for several months to build good habits.       Type 2 diabetes mellitus without complications ([place])    Remains  well-controlled on diet alone  . Patient is reminded to schedule an annual eye exam and foot exam is normal today. Patient has minimal microalbuminuria. Patient is tolerating statin therapy for CAD risk reduction and on ACE/ARB for renal protection and hypertension .  Encouraged to continue daily exercise and follow low GI diet. .  There is a strong  FH of type 2 DM.  Lab Results  Component Value Date   HGBA1C 6.2 03/19/2021   Lab Results  Component Value Date   MICROALBUR 1.5 09/16/2020         Vitamin D deficiency   Relevant Orders   VITAMIN D 25 Hydroxy (Vit-D Deficiency, Fractures) (Completed)   Hypertension associated with diabetes ([place])    Not at goal4  . Lab Results  Component Value Date   MICROALBUR 1.5 09/16/2020   MICROALBUR 2.0 (H) 03/14/2020           Other Visit Diagnoses     Type 2 diabetes mellitus with other specified complication, without long-term current use of insulin ([facility])    -  Primary   Relevant Orders   Hemoglobin A1c (Completed)   Comprehensive metabolic panel (Completed)   Lipid panel (Completed)       Meds ordered this encounter  Medications   varenicline (CHANTIX PAK) 0.5 MG X 11 & 1 MG X 42 tablet    Sig: Take one 0.5 mg tablet by mouth once daily for 3 days, then increase to one 0.5 mg tablet twice daily for 4 days, then increase to one 1 mg tablet twice daily.    Dispense:  53 tablet    Refill:  0   Tdap (BOOSTRIX) 5-2.5-18.5 LF-MCG/0.5 injection    Sig: Inject 0.5 mLs into the muscle once for 1 dose.    Dispense:  0.5 mL    Refill:  0   Zoster Vaccine Adjuvanted [facility]) injection    Sig: Inject 0.5 mLs into the muscle once for 1 dose.     Dispense:  1  each    Refill:  1    Medications Discontinued During This Encounter  Medication Reason   nicotine (NICODERM CQ - DOSED IN MG/24 HOURS) 14 mg/24hr patch     I provided  30 minutes  during this encounter reviewing patient's glycemic control,  dietary measures,  blood pressure readings.  recurrent problems and past surgeries, labs and imaging studies, providing counseling on her tobacco abuse and plans to quit  in a face to face visit  , and coordination  of care .    Follow-up: Return in about 6 months (around 09/19/2021).   [name], MD

## 2021-03-20 LAB — LIPID PANEL
Cholesterol: 173 mg/dL (ref 0–200)
HDL: 43.6 mg/dL (ref >39.00)
NonHDL: 129.43
Total CHOL/HDL Ratio: 4
Triglycerides: 233 mg/dL — ABNORMAL HIGH (ref 0.0–149.0)
VLDL: 46.6 mg/dL — ABNORMAL HIGH (ref 0.0–40.0)

## 2021-03-20 LAB — HEMOGLOBIN A1C: Hgb A1c MFr Bld: 6.2 % (ref 4.6–6.5)

## 2021-03-20 LAB — COMPREHENSIVE METABOLIC PANEL
ALT: 22 U/L (ref 0–35)
AST: 29 U/L (ref 0–37)
Albumin: 4.4 g/dL (ref 3.5–5.2)
Alkaline Phosphatase: 103 U/L (ref 39–117)
BUN: 10 mg/dL (ref 6–23)
CO2: 29 mEq/L (ref 19–32)
Calcium: 9.7 mg/dL (ref 8.4–10.5)
Chloride: 101 mEq/L (ref 96–112)
Creatinine, Ser: 0.82 mg/dL (ref 0.40–1.20)
GFR: 70.5 mL/min (ref >60.00)
Glucose, Bld: 84 mg/dL (ref 70–99)
Potassium: 3.5 mEq/L (ref 3.5–5.1)
Sodium: 142 mEq/L (ref 135–145)
Total Bilirubin: 0.8 mg/dL (ref 0.2–1.2)
Total Protein: 6.6 g/dL (ref 6.0–8.3)

## 2021-03-20 LAB — LDL CHOLESTEROL, DIRECT: Direct LDL: 103 mg/dL

## 2021-03-20 LAB — VITAMIN D 25 HYDROXY (VIT D DEFICIENCY, FRACTURES): VITD: 43.21 ng/mL (ref 30.00–100.00)

## 2021-03-21 ENCOUNTER — Ambulatory Visit (INDEPENDENT_AMBULATORY_CARE_PROVIDER_SITE_OTHER): Payer: Medicare HMO

## 2021-03-21 ENCOUNTER — Other Ambulatory Visit: Payer: Self-pay

## 2021-03-21 VITALS — BP 146/68 | HR 58

## 2021-03-21 DIAGNOSIS — I152 Hypertension secondary to endocrine disorders: Secondary | ICD-10-CM | POA: Diagnosis not present

## 2021-03-21 DIAGNOSIS — E1159 Type 2 diabetes mellitus with other circulatory complications: Secondary | ICD-10-CM | POA: Diagnosis not present

## 2021-03-21 NOTE — Progress Notes (Signed)
Patient is here for a BP check due to bp being high at last visit, as per patient.  Currently patients BP is 146/68 and BPM is 58. Ten minutes prior BP was 176/78.  Patient has no complaints of headaches, blurry vision, chest pain, arm pain, light headedness, dizziness, and nor jaw pain. Please see previous note for order.

## 2021-03-22 NOTE — Assessment & Plan Note (Signed)
Risks of continued tobacco use were discussed. She is  currently interested in tobacco cessation and is willing to restart Chantix.  3 minutes of counselling given ;regarding the way chanitx works,  The need to continue medication for several months to build good habits.

## 2021-03-22 NOTE — Assessment & Plan Note (Signed)
LDL is < 100 on atorvastatin and LFTs are normal.  No changes today  Lab Results  Component Value Date   CHOL 173 03/19/2021   HDL 43.60 03/19/2021   LDLCALC 94 03/14/2020   LDLDIRECT 103.0 03/19/2021   TRIG 233.0 (H) 03/19/2021   CHOLHDL 4 03/19/2021

## 2021-03-22 NOTE — Assessment & Plan Note (Signed)
Remains  well-controlled on diet alone  . Patient is reminded to schedule an annual eye exam and foot exam is normal today. Patient has minimal microalbuminuria. Patient is tolerating statin therapy for CAD risk reduction and on ACE/ARB for renal protection and hypertension .  Encouraged to continue daily exercise and follow low GI diet. .  There is a strong  FH of type 2 DM.  Lab Results  Component Value Date   HGBA1C 6.2 03/19/2021   Lab Results  Component Value Date   MICROALBUR 1.5 09/16/2020

## 2021-03-22 NOTE — Assessment & Plan Note (Addendum)
Not at goal on losartan 100 mg , hctz 25 mg and amlodipine 2.5 mg daily  Advised to bring home machine in for calibration,  Will need to  increase amlodipine to 5 mg daily  Lab Results  Component Value Date   CREATININE 0.82 03/19/2021   Lab Results  Component Value Date   NA 142 03/19/2021   K 3.5 03/19/2021   CL 101 03/19/2021   CO2 29 03/19/2021

## 2021-03-25 MED ORDER — AMLODIPINE BESYLATE 5 MG PO TABS: 5.0000 mg | ORAL_TABLET | Freq: Every day | ORAL | 1 refills | Status: DC

## 2021-03-25 NOTE — Addendum Note (Signed)
Addended by: [name] on: 03/25/2021 10:15 AM   Modules accepted: Orders

## 2021-05-12 ENCOUNTER — Other Ambulatory Visit: Payer: Self-pay | Admitting: Internal Medicine

## 2021-05-27 ENCOUNTER — Other Ambulatory Visit: Payer: Self-pay | Admitting: Internal Medicine

## 2021-05-27 DIAGNOSIS — E034 Atrophy of thyroid (acquired): Secondary | ICD-10-CM

## 2021-06-16 ENCOUNTER — Other Ambulatory Visit: Payer: Self-pay

## 2021-06-16 DIAGNOSIS — E034 Atrophy of thyroid (acquired): Secondary | ICD-10-CM

## 2021-06-16 MED ORDER — LEVOTHYROXINE SODIUM 88 MCG PO TABS: 88.0000 ug | ORAL_TABLET | Freq: Every day | ORAL | 0 refills | Status: DC

## 2021-07-07 ENCOUNTER — Telehealth: Payer: Self-pay | Admitting: Internal Medicine

## 2021-07-07 NOTE — Telephone Encounter (Signed)
Spoke with pharmacy and gave a verbal okay to change manufacturers. Pt is aware and able to pick up medication.

## 2021-07-07 NOTE — Telephone Encounter (Signed)
Pt called in regards to refill for EUTHYROX 88 MCG tablet [[id number]]  Pt states the medication is discontinued and changed vendors. Her new medication needs verbal approval. Pt uses [facility] on [address]. Pt has been completely out of medication for two weeks.

## 2021-08-05 ENCOUNTER — Other Ambulatory Visit: Payer: Self-pay | Admitting: Internal Medicine

## 2021-08-12 ENCOUNTER — Other Ambulatory Visit: Payer: Self-pay | Admitting: Internal Medicine

## 2021-09-19 ENCOUNTER — Other Ambulatory Visit: Payer: Self-pay

## 2021-09-19 ENCOUNTER — Ambulatory Visit (INDEPENDENT_AMBULATORY_CARE_PROVIDER_SITE_OTHER): Payer: Medicare HMO | Admitting: Internal Medicine

## 2021-09-19 ENCOUNTER — Encounter: Payer: Self-pay | Admitting: Internal Medicine

## 2021-09-19 VITALS — BP 130/70 | HR 67 | Temp 97.9°F | Ht 65.0 in | Wt 165.0 lb

## 2021-09-19 DIAGNOSIS — Z716 Tobacco abuse counseling: Secondary | ICD-10-CM

## 2021-09-19 DIAGNOSIS — I152 Hypertension secondary to endocrine disorders: Secondary | ICD-10-CM

## 2021-09-19 DIAGNOSIS — Z1231 Encounter for screening mammogram for malignant neoplasm of breast: Secondary | ICD-10-CM | POA: Diagnosis not present

## 2021-09-19 DIAGNOSIS — E119 Type 2 diabetes mellitus without complications: Secondary | ICD-10-CM

## 2021-09-19 DIAGNOSIS — E669 Obesity, unspecified: Secondary | ICD-10-CM

## 2021-09-19 DIAGNOSIS — E1159 Type 2 diabetes mellitus with other circulatory complications: Secondary | ICD-10-CM

## 2021-09-19 DIAGNOSIS — I10 Essential (primary) hypertension: Secondary | ICD-10-CM

## 2021-09-19 DIAGNOSIS — R5383 Other fatigue: Secondary | ICD-10-CM

## 2021-09-19 DIAGNOSIS — D751 Secondary polycythemia: Secondary | ICD-10-CM | POA: Diagnosis not present

## 2021-09-19 DIAGNOSIS — Z1211 Encounter for screening for malignant neoplasm of colon: Secondary | ICD-10-CM | POA: Diagnosis not present

## 2021-09-19 DIAGNOSIS — E034 Atrophy of thyroid (acquired): Secondary | ICD-10-CM | POA: Diagnosis not present

## 2021-09-19 DIAGNOSIS — E1169 Type 2 diabetes mellitus with other specified complication: Secondary | ICD-10-CM | POA: Diagnosis not present

## 2021-09-19 DIAGNOSIS — E785 Hyperlipidemia, unspecified: Secondary | ICD-10-CM

## 2021-09-19 LAB — CBC WITH DIFFERENTIAL/PLATELET
Basophils Absolute: 0 10*3/uL (ref 0.0–0.1)
Basophils Relative: 0.8 % (ref 0.0–3.0)
Eosinophils Absolute: 0.1 10*3/uL (ref 0.0–0.7)
Eosinophils Relative: 1.1 % (ref 0.0–5.0)
HCT: 45.2 % (ref 36.0–46.0)
Hemoglobin: 15.1 g/dL — ABNORMAL HIGH (ref 12.0–15.0)
Lymphocytes Relative: 23.6 % (ref 12.0–46.0)
Lymphs Abs: 1.3 10*3/uL (ref 0.7–4.0)
MCHC: 33.3 g/dL (ref 30.0–36.0)
MCV: 91.4 fl (ref 78.0–100.0)
Monocytes Absolute: 0.5 10*3/uL (ref 0.1–1.0)
Monocytes Relative: 9.3 % (ref 3.0–12.0)
Neutro Abs: 3.5 10*3/uL (ref 1.4–7.7)
Neutrophils Relative %: 65.2 % (ref 43.0–77.0)
Platelets: 189 10*3/uL (ref 150.0–400.0)
RBC: 4.95 Mil/uL (ref 3.87–5.11)
RDW: 13.7 % (ref 11.5–15.5)
WBC: 5.3 10*3/uL (ref 4.0–10.5)

## 2021-09-19 LAB — TSH: TSH: 2.04 u[IU]/mL (ref 0.35–5.50)

## 2021-09-19 LAB — COMPREHENSIVE METABOLIC PANEL
ALT: 24 U/L (ref 0–35)
AST: 34 U/L (ref 0–37)
Albumin: 4.3 g/dL (ref 3.5–5.2)
Alkaline Phosphatase: 66 U/L (ref 39–117)
BUN: 16 mg/dL (ref 6–23)
CO2: 32 mEq/L (ref 19–32)
Calcium: 9.6 mg/dL (ref 8.4–10.5)
Chloride: 100 mEq/L (ref 96–112)
Creatinine, Ser: 0.78 mg/dL (ref 0.40–1.20)
GFR: 74.59 mL/min (ref >60.00)
Glucose, Bld: 109 mg/dL — ABNORMAL HIGH (ref 70–99)
Potassium: 3.9 mEq/L (ref 3.5–5.1)
Sodium: 139 mEq/L (ref 135–145)
Total Bilirubin: 0.6 mg/dL (ref 0.2–1.2)
Total Protein: 6 g/dL (ref 6.0–8.3)

## 2021-09-19 LAB — MICROALBUMIN / CREATININE URINE RATIO
Creatinine,U: 15.7 mg/dL
Microalb Creat Ratio: 4.5 mg/g (ref 0.0–30.0)
Microalb, Ur: <0.7 mg/dL (ref 0.0–1.9)

## 2021-09-19 LAB — LIPID PANEL
Cholesterol: 109 mg/dL (ref 0–200)
HDL: 40.6 mg/dL (ref >39.00)
LDL Cholesterol: 53 mg/dL (ref 0–99)
NonHDL: 68.56
Total CHOL/HDL Ratio: 3
Triglycerides: 80 mg/dL (ref 0.0–149.0)
VLDL: 16 mg/dL (ref 0.0–40.0)

## 2021-09-19 LAB — HEMOGLOBIN A1C: Hgb A1c MFr Bld: 5.6 % (ref 4.6–6.5)

## 2021-09-19 MED ORDER — TETANUS-DIPHTH-ACELL PERTUSSIS 5-2.5-18.5 LF-MCG/0.5 IM SUSY: 0.5000 mL | PREFILLED_SYRINGE | Freq: Once | INTRAMUSCULAR | 0 refills | Status: AC

## 2021-09-19 MED ORDER — ZOSTER VAC RECOMB ADJUVANTED 50 MCG/0.5ML IM SUSR: 0.5000 mL | Freq: Once | INTRAMUSCULAR | 1 refills | Status: AC

## 2021-09-19 NOTE — Progress Notes (Signed)
Subjective:  Patient ID: Alexandra Copeland, female    DOB: [birth year]  Age: 76 y.o. MRN: [id number]  CC: The primary encounter diagnosis was Primary hypertension. Diagnoses of Hypothyroidism due to acquired atrophy of thyroid, Type 2 diabetes mellitus without complication, without long-term current use of insulin ([place]), Encounter for screening mammogram for malignant neoplasm of breast, Hyperlipidemia associated with type 2 diabetes mellitus ([place]), Other fatigue, Colon cancer screening, Obesity, diabetes, and hypertension syndrome ([place]), Tobacco abuse counseling, and Polycythemia secondary to smoking were also pertinent to this visit.   This visit occurred during the SARS-CoV-2 public health emergency.  Safety protocols were in place, including screening questions prior to the visit, additional usage of staff PPE, and extensive cleaning of exam room while observing appropriate contact time as indicated for disinfecting solutions.    HPI Alexandra Copeland presents for  Chief Complaint  Patient presents with   Follow-up    [id number] up for HTN and D.M   1) htn:  Patient is taking her medications as prescribed and notes no adverse effects.  Home BP readings have been done about once per week and are  generally < 130/80 .  She is avoiding added salt in her diet and walking regularly about 3 times per week for exercise  .   2) T2DM:  She  feels generally well,   is  exercising regularly and steadily losing weight.  Drinking 100 ounces of water daily . Down 27 lbs by her scales,  since August. . Has lost 3 inches off her chest and waist .   following the [employer] diet.  Joints no longer hurting .. no longer having reflux either.  Walking regularly  Checking  blood sugars less than once daily at variable times, usually only if she feels she may be having a hypoglycemic event. .  BS have been under 130 fasting and < 150 post prandially.  Denies any recent hypoglyemic events.  Taking   medications as  directed. Following a carbohydrate modified diet 7 days per week. Denies numbness, burning and tingling of extremities. Appetite is good.    3) Dry skin    Outpatient Medications Prior to Visit  Medication Sig Dispense Refill   amLODipine (NORVASC) 5 MG tablet Take 1 tablet (5 mg total) by mouth daily. 90 tablet 1   aspirin 325 MG tablet Take 325 mg by mouth daily.     atorvastatin (LIPITOR) 80 MG tablet Take 1 tablet by mouth once daily 90 tablet 3   cholecalciferol (VITAMIN D) 1000 UNITS tablet Take 1,000 Units by mouth daily.     hydrochlorothiazide (HYDRODIURIL) 25 MG tablet Take 1 tablet by mouth once daily 90 tablet 0   levothyroxine (SYNTHROID) 88 MCG tablet Take 1 tablet (88 mcg total) by mouth daily before breakfast. 90 tablet 0   losartan (COZAAR) 100 MG tablet Take 1 tablet by mouth once daily 90 tablet 1   Multiple Vitamin (MULTIVITAMIN) tablet Take 1 tablet by mouth daily.     NYSTATIN powder APPLY ONE APPLICATION TWICE DAILY TO RASH UNTIL RESOLVED 15 g 0   triamcinolone cream (KENALOG) 0.1 % Apply 1 application topically 2 (two) times daily. 80 g 0   famotidine (PEPCID) 20 MG tablet Take 20 mg by mouth daily. (Patient not taking: Reported on 09/19/2021)     Glucosamine Sulfate Potassium POWD 1,000 mg 2 (two) times daily. (Patient not taking: Reported on 09/19/2021)     varenicline (CHANTIX PAK) 0.5 MG X 11 &  1 MG X 42 tablet Take one 0.5 mg tablet by mouth once daily for 3 days, then increase to one 0.5 mg tablet twice daily for 4 days, then increase to one 1 mg tablet twice daily. (Patient not taking: Reported on 09/19/2021) 53 tablet 0   celecoxib (CELEBREX) 200 MG capsule Take 1 capsule (200 mg total) by mouth daily as needed. (Patient not taking: Reported on 09/19/2021) 30 capsule 0   No facility-administered medications prior to visit.    Review of Systems;  Patient denies headache, fevers, malaise, unintentional weight loss, skin rash, eye pain, sinus congestion and sinus  pain, sore throat, dysphagia,  hemoptysis , cough, dyspnea, wheezing, chest pain, palpitations, orthopnea, edema, abdominal pain, nausea, melena, diarrhea, constipation, flank pain, dysuria, hematuria, urinary  Frequency, nocturia, numbness, tingling, seizures,  Focal weakness, Loss of consciousness,  Tremor, insomnia, depression, anxiety, and suicidal ideation.      Objective:  BP 130/70 (BP Location: Left Arm, Patient Position: Sitting, Cuff Size: Small)    Pulse 67    Temp 97.9 F (36.6 C) (Oral)    Ht 5' 5" (1.651 m)    Wt 165 lb (74.8 kg)    SpO2 98%    BMI 27.46 kg/m   BP Readings from Last 3 Encounters:  09/19/21 130/70  03/21/21 (!) 146/68  03/19/21 (!) 160/80    Wt Readings from Last 3 Encounters:  09/19/21 165 lb (74.8 kg)  03/19/21 185 lb 6.4 oz (84.1 kg)  03/11/21 182 lb (82.6 kg)    General appearance: alert, cooperative and appears stated age Ears: normal TM's and external ear canals both ears Throat: lips, mucosa, and tongue normal; teeth and gums normal Neck: no adenopathy, no carotid bruit, supple, symmetrical, trachea midline and thyroid not enlarged, symmetric, no tenderness/mass/nodules Back: symmetric, no curvature. ROM normal. No CVA tenderness. Lungs: clear to auscultation bilaterally Heart: regular rate and rhythm, S1, S2 normal, no murmur, click, rub or gallop Abdomen: soft, non-tender; bowel sounds normal; no masses,  no organomegaly Pulses: 2+ and symmetric Skin: Skin color, texture, turgor normal. No rashes or lesions Lymph nodes: Cervical, supraclavicular, and axillary nodes normal.  Lab Results  Component Value Date   HGBA1C 5.6 09/19/2021   HGBA1C 6.2 03/19/2021   HGBA1C 6.2 09/16/2020    Lab Results  Component Value Date   CREATININE 0.78 09/19/2021   CREATININE 0.82 03/19/2021   CREATININE 0.81 09/16/2020    Lab Results  Component Value Date   WBC 5.3 09/19/2021   HGB 15.1 (H) 09/19/2021   HCT 45.2 09/19/2021   PLT 189.0 09/19/2021    GLUCOSE 109 (H) 09/19/2021   CHOL 109 09/19/2021   TRIG 80.0 09/19/2021   HDL 40.60 09/19/2021   LDLDIRECT 103.0 03/19/2021   LDLCALC 53 09/19/2021   ALT 24 09/19/2021   AST 34 09/19/2021   NA 139 09/19/2021   K 3.9 09/19/2021   CL 100 09/19/2021   CREATININE 0.78 09/19/2021   BUN 16 09/19/2021   CO2 32 09/19/2021   TSH 2.04 09/19/2021   HGBA1C 5.6 09/19/2021   MICROALBUR <0.7 09/19/2021    MM 3D SCREEN BREAST BILATERAL  Result Date: 10/08/2020 CLINICAL DATA:  Screening. EXAM: DIGITAL SCREENING BILATERAL MAMMOGRAM WITH TOMOSYNTHESIS AND CAD TECHNIQUE: Bilateral screening digital craniocaudal and mediolateral oblique mammograms were obtained. Bilateral screening digital breast tomosynthesis was performed. The images were evaluated with computer-aided detection. COMPARISON:  Previous exam(s). ACR Breast Density Category b: There are scattered areas of fibroglandular density. FINDINGS: There are  no findings suspicious for malignancy. The images were evaluated with computer-aided detection. IMPRESSION: No mammographic evidence of malignancy. A result letter of this screening mammogram will be mailed directly to the patient. RECOMMENDATION: Screening mammogram in one year. (Code:SM-B-01Y) BI-RADS CATEGORY  1: Negative. Electronically Signed   By: Lajean Manes M.D.   On: 10/08/2020 10:47    Assessment & Plan:   Problem List Items Addressed This Visit     Breast cancer screening   Relevant Orders   MM 3D SCREEN BREAST BILATERAL   Colon cancer screening   Relevant Orders   Cologuard   Hyperlipidemia associated with type 2 diabetes mellitus ([place])    LDL is < 70 on atorvastatin and LFTs are normal.  No changes today  Lab Results  Component Value Date   CHOL 109 09/19/2021   HDL 40.60 09/19/2021   LDLCALC 53 09/19/2021   LDLDIRECT 103.0 03/19/2021   TRIG 80.0 09/19/2021   CHOLHDL 3 09/19/2021         Relevant Orders   Lipid Profile (Completed)   Hypertension - Primary    Relevant Orders   Comp Met (CMET) (Completed)   Urine Microalbumin w/creat. ratio (Completed)   Hypothyroidism    Thyroid function is WNL on 88 mcg levothyroxine  No current changes needed.   Lab Results  Component Value Date   TSH 2.04 09/19/2021         Relevant Orders   TSH (Completed)   Obesity, diabetes, and hypertension syndrome ([employer])    Historically   well-controlled on diet alone . Has lost 27 lbs since last visit . Patient is advised to dc hctz and follow BPs at home.  She reminded to schedule an annual eye exam and foot exam is normal today. Patient has minimal microalbuminuria. Patient is tolerating statin therapy for CAD risk reduction and on ACE/ARB for renal protection and hypertension .  Encouraged to continue daily exercise and follow low GI diet. .        Tobacco abuse counseling    She quit for several months using Chantix.  She has resumed due to family stressors and plans to quit using nicoderm       Other Visit Diagnoses     Other fatigue       Polycythemia secondary to smoking       Relevant Orders   CBC with Differential/Platelet (Completed)       I spent 30 minutes dedicated to the care of this patient on the date of this encounter to include pre-visit review of patient's medical history,  most recent imaging studies, Face-to-face time with the patient , and post visit ordering of testing and therapeutics.    Follow-up: Return in about 6 months (around 03/19/2022) for follow up diabetes.   [name], MD

## 2021-09-19 NOTE — Assessment & Plan Note (Addendum)
Historically   well-controlled on diet alone . Has lost 27 lbs since last visit . Patient is advised to dc hctz and follow BPs at home.  She reminded to schedule an annual eye exam and foot exam is normal today. Patient has minimal microalbuminuria. Patient is tolerating statin therapy for CAD risk reduction and on ACE/ARB for renal protection and hypertension .  Encouraged to continue daily exercise and follow low GI diet. Marland Kitchen

## 2021-09-19 NOTE — Patient Instructions (Addendum)
Congratulations!  For your blood pressure:  You can stop  the hctz and continue the losartan and the amlodipine  Send me an update in 2 weeks:  check  your BP and check ankles for fluid retention     Moisturize your legs and feet!  Use a good quality moisturizer:  Ceravie Eucerin [place]

## 2021-09-19 NOTE — Assessment & Plan Note (Addendum)
She quit for several months using Chantix.  She has resumed due to family stressors and plans to quit using nicoderm

## 2021-09-20 NOTE — Assessment & Plan Note (Addendum)
Thyroid function is WNL on 88 mcg levothyroxine  No current changes needed.   Lab Results  Component Value Date   TSH 2.04 09/19/2021

## 2021-09-20 NOTE — Assessment & Plan Note (Signed)
LDL is < 70 on atorvastatin and LFTs are normal.  No changes today  Lab Results  Component Value Date   CHOL 109 09/19/2021   HDL 40.60 09/19/2021   LDLCALC 53 09/19/2021   LDLDIRECT 103.0 03/19/2021   TRIG 80.0 09/19/2021   CHOLHDL 3 09/19/2021

## 2021-09-29 DIAGNOSIS — Z1211 Encounter for screening for malignant neoplasm of colon: Secondary | ICD-10-CM | POA: Diagnosis not present

## 2021-10-04 LAB — COLOGUARD: COLOGUARD: POSITIVE — AB

## 2021-10-05 ENCOUNTER — Encounter: Payer: Self-pay | Admitting: Internal Medicine

## 2021-10-05 ENCOUNTER — Other Ambulatory Visit: Payer: Self-pay | Admitting: Internal Medicine

## 2021-10-06 ENCOUNTER — Encounter: Payer: Self-pay | Admitting: Internal Medicine

## 2021-10-06 DIAGNOSIS — R195 Other fecal abnormalities: Secondary | ICD-10-CM

## 2021-10-07 ENCOUNTER — Telehealth: Payer: Self-pay

## 2021-10-07 NOTE — Telephone Encounter (Signed)
Scheduled for 12/29/2021

## 2021-10-26 ENCOUNTER — Other Ambulatory Visit: Payer: Self-pay | Admitting: Internal Medicine

## 2021-11-23 ENCOUNTER — Other Ambulatory Visit: Payer: Self-pay | Admitting: Internal Medicine

## 2021-12-22 ENCOUNTER — Ambulatory Visit
Admission: RE | Admit: 2021-12-22 | Discharge: 2021-12-22 | Disposition: A | Discharge status: Home / Self Care | Payer: Medicare HMO | Source: Ambulatory Visit | Attending: Internal Medicine | Admitting: Internal Medicine

## 2021-12-22 DIAGNOSIS — Z1231 Encounter for screening mammogram for malignant neoplasm of breast: Secondary | ICD-10-CM | POA: Diagnosis not present

## 2021-12-29 ENCOUNTER — Encounter: Payer: Self-pay | Admitting: Gastroenterology

## 2021-12-29 ENCOUNTER — Ambulatory Visit: Payer: Medicare HMO | Admitting: Gastroenterology

## 2021-12-29 ENCOUNTER — Other Ambulatory Visit: Payer: Self-pay

## 2021-12-29 VITALS — BP 197/70 | HR 76 | Temp 98.1°F | Ht 67.0 in | Wt 146.0 lb

## 2021-12-29 DIAGNOSIS — R195 Other fecal abnormalities: Secondary | ICD-10-CM | POA: Diagnosis not present

## 2021-12-29 NOTE — Progress Notes (Signed)
Jonathon Bellows MD, MRCP(U.K) [address]  [place]  [place], [place] [address]  Main: [phone]  Fax: [phone]   Gastroenterology Consultation  Referring Provider:     [name], MD Primary Care Physician:  [name], MD Primary Gastroenterologist:  Dr. Jonathon Bellows  Reason for Consultation:     Positive Cologuard        HPI:   Alexandra Copeland is a 76 y.o. y/o female referred for consultation & management  by Dr. Derrel Nip, Aris Everts, MD.    She has been referred to see me for a positive Cologuard test.  Performed on 09/29/2021.  Hemoglobin 15.1 g. Ex nurse retired.  Never had a colonoscopy.  No change in bowel habits.  No blood in the stool.  No change in shape of stool.  No unintentional weight loss Past Medical History:  Diagnosis Date   Hyperlipidemia    Hypertension    Hypothyroid    Scarlet fever    as a child   Stroke [facility]) June 2010   left parietal hypertensive    Past Surgical History:  Procedure Laterality Date   BREAST CYST ASPIRATION Left 1978   neg   BREAST SURGERY  1975   abscess drained   CATARACT EXTRACTION W/PHACO Left 02/20/2020   Procedure: CATARACT EXTRACTION PHACO AND INTRAOCULAR LENS PLACEMENT (IOC) LEFT DIABETIC 6.77 00:41.8;  Surgeon: [name], MD;  Location: [place];  Service: Ophthalmology;  Laterality: Left;   CATARACT EXTRACTION W/PHACO Right 03/18/2020   Procedure: CATARACT EXTRACTION PHACO AND INTRAOCULAR LENS PLACEMENT (IOC) RIGHT DIABETIC 9.71  01:00.9;  Surgeon: [name], MD;  Location: [place];  Service: Ophthalmology;  Laterality: Right;   TUBAL LIGATION      Prior to Admission medications   Medication Sig Start Date End Date Taking? Authorizing Provider  amLODipine (NORVASC) 5 MG tablet Take 1 tablet by mouth once daily 10/06/21   [name], MD  aspirin 325 MG tablet Take 325 mg by mouth daily.    [provider]  atorvastatin (LIPITOR) 80 MG tablet Take 1  tablet by mouth once daily 10/27/21   [name], MD  hydrochlorothiazide (HYDRODIURIL) 25 MG tablet Take 1 tablet by mouth once daily 11/24/21   [name], MD  levothyroxine (SYNTHROID) 88 MCG tablet Take 1 tablet (88 mcg total) by mouth daily before breakfast. 06/16/21   [name], MD  losartan (COZAAR) 100 MG tablet Take 1 tablet by mouth once daily 08/13/21   [name], MD  NYSTATIN powder APPLY ONE APPLICATION TWICE DAILY TO RASH UNTIL RESOLVED 08/06/20   [name], MD  triamcinolone cream (KENALOG) 0.1 % Apply 1 application topically 2 (two) times daily. 03/14/20   [name], MD    Family History  Problem Relation Age of Onset   Heart disease Mother    Hypertension Mother    Stroke Father    Hyperlipidemia Father    Vision loss Father    Hypertension Brother    Rheum arthritis Brother    Diabetes Paternal Grandmother    Breast cancer Neg Hx      Social History   Tobacco Use   Smoking status: Every Day    Packs/day: 1.00    Years: 26.00    Pack years: 26.00    Types: Cigarettes    Start date: 05/10/2018   Smokeless tobacco: Never   Tobacco comments:    quit  for several years.  Has recently started back.  Vaping Use   Vaping Use: Never used  Substance Use Topics   Alcohol use: Yes    Alcohol/week: 1.0 standard drink    Types: 1 Glasses of wine per week    Comment: OCC glass of wine   Drug use: No    Allergies as of 12/29/2021   (No Known Allergies)    Review of Systems:    All systems reviewed and negative except where noted in HPI.   Physical Exam:  There were no vitals taken for this visit. No LMP recorded. Patient is postmenopausal. Psych:  Alert and cooperative. Normal mood and affect. General:   Alert,  Well-developed, well-nourished, pleasant and cooperative in NAD Head:  Normocephalic and atraumatic. Neurologic:  Alert and oriented x3;  grossly normal neurologically. Psych:  Alert and cooperative. Normal mood and  affect.  Imaging Studies: MM 3D SCREEN BREAST BILATERAL  Result Date: 12/23/2021 CLINICAL DATA:  Screening. EXAM: DIGITAL SCREENING BILATERAL MAMMOGRAM WITH TOMOSYNTHESIS AND CAD TECHNIQUE: Bilateral screening digital craniocaudal and mediolateral oblique mammograms were obtained. Bilateral screening digital breast tomosynthesis was performed. The images were evaluated with computer-aided detection. COMPARISON:  Previous exam(s). ACR Breast Density Category b: There are scattered areas of fibroglandular density. FINDINGS: There are no findings suspicious for malignancy. IMPRESSION: No mammographic evidence of malignancy. A result letter of this screening mammogram will be mailed directly to the patient. RECOMMENDATION: Screening mammogram in one year. (Code:SM-B-01Y) BI-RADS CATEGORY  1: Negative. Electronically Signed   By: Fidela Salisbury M.D.   On: 12/23/2021 09:34    Assessment and Plan:   Alexandra Copeland is a 76 y.o. y/o female has been referred for a positive Cologuard test  Plan Diagnostic colonoscopy   I have discussed alternative options, risks & benefits,  which include, but are not limited to, bleeding, infection, perforation,respiratory complication & drug reaction.  The patient agrees with this plan & written consent will be obtained.     Follow up in as needed  Dr Jonathon Bellows MD,MRCP(U.K)

## 2021-12-30 ENCOUNTER — Other Ambulatory Visit: Payer: Self-pay | Admitting: Internal Medicine

## 2021-12-30 DIAGNOSIS — E034 Atrophy of thyroid (acquired): Secondary | ICD-10-CM

## 2022-01-02 ENCOUNTER — Encounter: Payer: Self-pay | Admitting: Gastroenterology

## 2022-01-06 ENCOUNTER — Ambulatory Visit: Payer: Medicare HMO | Admitting: Registered Nurse

## 2022-01-06 ENCOUNTER — Encounter
Admission: RE | Disposition: A | Discharge status: Home / Self Care | Payer: Self-pay | Source: Home / Self Care | Attending: Gastroenterology

## 2022-01-06 ENCOUNTER — Other Ambulatory Visit: Payer: Self-pay

## 2022-01-06 ENCOUNTER — Ambulatory Visit
Admission: RE | Admit: 2022-01-06 | Discharge: 2022-01-06 | Disposition: A | Discharge status: Home / Self Care | Payer: Medicare HMO | Attending: Gastroenterology | Admitting: Gastroenterology

## 2022-01-06 ENCOUNTER — Encounter: Payer: Self-pay | Admitting: Gastroenterology

## 2022-01-06 DIAGNOSIS — Z8673 Personal history of transient ischemic attack (TIA), and cerebral infarction without residual deficits: Secondary | ICD-10-CM | POA: Diagnosis not present

## 2022-01-06 DIAGNOSIS — K635 Polyp of colon: Secondary | ICD-10-CM | POA: Insufficient documentation

## 2022-01-06 DIAGNOSIS — K5730 Diverticulosis of large intestine without perforation or abscess without bleeding: Secondary | ICD-10-CM | POA: Insufficient documentation

## 2022-01-06 DIAGNOSIS — F17210 Nicotine dependence, cigarettes, uncomplicated: Secondary | ICD-10-CM | POA: Insufficient documentation

## 2022-01-06 DIAGNOSIS — R195 Other fecal abnormalities: Secondary | ICD-10-CM | POA: Diagnosis not present

## 2022-01-06 DIAGNOSIS — E119 Type 2 diabetes mellitus without complications: Secondary | ICD-10-CM | POA: Diagnosis not present

## 2022-01-06 DIAGNOSIS — Z1211 Encounter for screening for malignant neoplasm of colon: Secondary | ICD-10-CM | POA: Insufficient documentation

## 2022-01-06 DIAGNOSIS — I10 Essential (primary) hypertension: Secondary | ICD-10-CM | POA: Insufficient documentation

## 2022-01-06 DIAGNOSIS — E039 Hypothyroidism, unspecified: Secondary | ICD-10-CM | POA: Insufficient documentation

## 2022-01-06 DIAGNOSIS — E785 Hyperlipidemia, unspecified: Secondary | ICD-10-CM | POA: Insufficient documentation

## 2022-01-06 DIAGNOSIS — D128 Benign neoplasm of rectum: Secondary | ICD-10-CM | POA: Diagnosis not present

## 2022-01-06 HISTORY — PX: COLONOSCOPY WITH PROPOFOL: SHX5780

## 2022-01-06 SURGERY — COLONOSCOPY WITH PROPOFOL
Anesthesia: General

## 2022-01-06 MED ORDER — EPHEDRINE SULFATE (PRESSORS) 50 MG/ML IJ SOLN
INTRAMUSCULAR | Status: DC | PRN
  Administered 2022-01-06: 5 mg via INTRAVENOUS
  Administered 2022-01-06: 10 mg via INTRAVENOUS

## 2022-01-06 MED ORDER — LIDOCAINE HCL (CARDIAC) PF 100 MG/5ML IV SOSY
PREFILLED_SYRINGE | INTRAVENOUS | Status: DC | PRN
  Administered 2022-01-06: 100 mg via INTRAVENOUS

## 2022-01-06 MED ORDER — PROPOFOL 10 MG/ML IV BOLUS
INTRAVENOUS | Status: DC | PRN
  Administered 2022-01-06: 60 mg via INTRAVENOUS
  Administered 2022-01-06: 20 mg via INTRAVENOUS

## 2022-01-06 MED ORDER — STERILE WATER FOR IRRIGATION IR SOLN
Status: DC | PRN
  Administered 2022-01-06: 50 mL

## 2022-01-06 MED ORDER — PROPOFOL 500 MG/50ML IV EMUL
INTRAVENOUS | Status: DC | PRN
  Administered 2022-01-06: 150 ug/kg/min via INTRAVENOUS

## 2022-01-06 MED ORDER — SODIUM CHLORIDE 0.9 % IV SOLN: INTRAVENOUS | Status: DC

## 2022-01-06 NOTE — Anesthesia Postprocedure Evaluation (Signed)
Anesthesia Post Note  Patient: MAKINZY CLEERE  Procedure(s) Performed: COLONOSCOPY WITH PROPOFOL  Patient location during evaluation: Endoscopy Anesthesia Type: General Level of consciousness: awake and alert Pain management: pain level controlled Vital Signs Assessment: post-procedure vital signs reviewed and stable Respiratory status: spontaneous breathing, nonlabored ventilation, respiratory function stable and patient connected to nasal cannula oxygen Cardiovascular status: blood pressure returned to baseline and stable Postop Assessment: no apparent nausea or vomiting Anesthetic complications: no   No notable events documented.   Last Vitals:  Vitals:   01/06/22 1129 01/06/22 1139  BP: (!) 114/45 (!) 126/48  Pulse: 67 (!) 59  Resp: 18 17  Temp:    SpO2: 96% 98%    Last Pain:  Vitals:   01/06/22 1139  TempSrc:   PainSc: 0-No pain                 Precious Haws Bradi [place]

## 2022-01-06 NOTE — Transfer of Care (Signed)
Immediate Anesthesia Transfer of Care Note  Patient: Alexandra Copeland  Procedure(s) Performed: COLONOSCOPY WITH PROPOFOL  Patient Location: Endoscopy Unit  Anesthesia Type:General  Level of Consciousness: drowsy  Airway & Oxygen Therapy: Patient Spontanous Breathing  Post-op Assessment: Report given to RN and Post -op Vital signs reviewed and stable  Post vital signs: Reviewed and stable  Last Vitals:  Vitals Value Taken Time  BP 100/38 01/06/22 1120  Temp    Pulse 60 01/06/22 1121  Resp 18 01/06/22 1121  SpO2 97 % 01/06/22 1121  Vitals shown include unvalidated device data.  Last Pain:  Vitals:   01/06/22 1032  TempSrc: Temporal  PainSc: 0-No pain         Complications: No notable events documented.

## 2022-01-06 NOTE — Op Note (Signed)
[facility] Patient Name: Shalissa Easterwood Procedure Date: 01/06/2022 10:40 AM MRN: [id number] Account #: [id number] Date of Birth: 09/18/1945 Admit Type: Outpatient Age: 76 Room: [facility] ENDO ROOM 2 Gender: Female Note Status: Finalized Instrument Name: Peds Colonoscope [id number] Procedure:             Colonoscopy Indications:           Positive Cologuard test Providers:             Jonathon Bellows MD, MD Referring MD:          Deborra Medina, MD (Referring MD) Medicines:             Monitored Anesthesia Care Complications:         No immediate complications. Procedure:             Pre-Anesthesia Assessment:                        - Prior to the procedure, a History and Physical was                         performed, and patient medications, allergies and                         sensitivities were reviewed. The patient's tolerance                         of previous anesthesia was reviewed.                        - The risks and benefits of the procedure and the                         sedation options and risks were discussed with the                         patient. All questions were answered and informed                         consent was obtained.                        - ASA Grade Assessment: II - A patient with mild                         systemic disease.                        After obtaining informed consent, the colonoscope was                         passed under direct vision. Throughout the procedure,                         the patient's blood pressure, pulse, and oxygen                         saturations were monitored continuously. The                         Colonoscope was introduced through  the anus and                         advanced to the the cecum, identified by the                         appendiceal orifice. The colonoscopy was performed                         with ease. The patient tolerated the procedure well.                          The quality of the bowel preparation was good. Findings:      The perianal and digital rectal examinations were normal.      A 5 mm polyp was found in the rectum. The polyp was sessile. The polyp       was removed with a cold snare. Resection and retrieval were complete.      The exam was otherwise without abnormality on direct and retroflexion       views.      Multiple small and large-mouthed diverticula were found in the entire       colon.      The exam was otherwise without abnormality on direct and retroflexion       views. Impression:            - One 5 mm polyp in the rectum, removed with a cold                         snare. Resected and retrieved.                        - The examination was otherwise normal on direct and                         retroflexion views.                        - Diverticulosis in the entire examined colon.                        - The examination was otherwise normal on direct and                         retroflexion views. Recommendation:        - Discharge patient to home (with escort).                        - Resume previous diet.                        - Continue present medications.                        - Await pathology results.                        - Repeat colonoscopy is not recommended due to current                         age (70 years  or older) for surveillance. Procedure Code(s):     --- Professional ---                        [phone], Colonoscopy, flexible; with removal of                         tumor(s), polyp(s), or other lesion(s) by snare                         technique Diagnosis Code(s):     --- Professional ---                        K62.1, Rectal polyp                        R19.5, Other fecal abnormalities                        K57.30, Diverticulosis of large intestine without                         perforation or abscess without bleeding CPT copyright 2019 American Medical Association. All rights  reserved. The codes documented in this report are preliminary and upon coder review may  be revised to meet current compliance requirements. Jonathon Bellows, MD Jonathon Bellows MD, MD 01/06/2022 11:18:22 AM This report has been signed electronically. Number of Addenda: 0 Note Initiated On: 01/06/2022 10:40 AM Scope Withdrawal Time: 0 hours 18 minutes 23 seconds  Total Procedure Duration: 0 hours 22 minutes 12 seconds  Estimated Blood Loss:  Estimated blood loss: none.      [facility]

## 2022-01-06 NOTE — H&P (Signed)
Jonathon Bellows, MD [address] [place], [place], [place], [address] [address], [place], [place], [place], [address] Phone: [phone]  Fax: [phone]  Primary Care Physician:  [name], MD   Pre-Procedure History & Physical: HPI:  Alexandra Copeland is a 75 y.o. female is here for an colonoscopy.   Past Medical History:  Diagnosis Date   Hyperlipidemia    Hypertension    Hypothyroid    Scarlet fever    as a child   Stroke [facility]) June 2010   left parietal hypertensive    Past Surgical History:  Procedure Laterality Date   BREAST CYST ASPIRATION Left 1978   neg   BREAST SURGERY  1975   abscess drained   CATARACT EXTRACTION W/PHACO Left 02/20/2020   Procedure: CATARACT EXTRACTION PHACO AND INTRAOCULAR LENS PLACEMENT (IOC) LEFT DIABETIC 6.77 00:41.8;  Surgeon: [name], MD;  Location: [place];  Service: Ophthalmology;  Laterality: Left;   CATARACT EXTRACTION W/PHACO Right 03/18/2020   Procedure: CATARACT EXTRACTION PHACO AND INTRAOCULAR LENS PLACEMENT (IOC) RIGHT DIABETIC 9.71  01:00.9;  Surgeon: [name], MD;  Location: [place];  Service: Ophthalmology;  Laterality: Right;   TUBAL LIGATION      Prior to Admission medications   Medication Sig Start Date End Date Taking? Authorizing Provider  amLODipine (NORVASC) 5 MG tablet Take 1 tablet by mouth once daily 12/30/21  Yes [name], MD  hydrochlorothiazide (HYDRODIURIL) 25 MG tablet Take 1 tablet by mouth once daily 11/24/21  Yes [name], MD  levothyroxine (SYNTHROID) 88 MCG tablet TAKE 1 TABLET BY MOUTH ONCE DAILY BEFORE BREAKFAST 12/30/21  Yes [name], MD  losartan (COZAAR) 100 MG tablet Take 1 tablet by mouth once daily 08/13/21  Yes [name], MD  aspirin 325 MG tablet Take 325 mg by mouth daily.    [provider]  atorvastatin (LIPITOR) 80 MG tablet Take 1 tablet by mouth once daily 10/27/21   [name], MD  magnesium  chloride (SLOW-MAG) 64 MG TBEC SR tablet Take 100 mg by mouth daily.    [provider]  NYSTATIN powder APPLY ONE APPLICATION TWICE DAILY TO RASH UNTIL RESOLVED 08/06/20   [name], MD  triamcinolone cream (KENALOG) 0.1 % Apply 1 application topically 2 (two) times daily. 03/14/20   [name], MD    Allergies as of 12/29/2021   (No Known Allergies)    Family History  Problem Relation Age of Onset   Heart disease Mother    Hypertension Mother    Stroke Father    Hyperlipidemia Father    Vision loss Father    Hypertension Brother    Rheum arthritis Brother    Diabetes Paternal Grandmother    Breast cancer Neg Hx     Social History   Socioeconomic History   Marital status: Single    Spouse name: Not on file   Number of children: Not on file   Years of education: Not on file   Highest education level: Not on file  Occupational History   Not on file  Tobacco Use   Smoking status: Every Day    Packs/day: 1.00    Years: 26.00    Pack years: 26.00    Types: Cigarettes    Start date: 05/10/2018   Smokeless tobacco: Never   Tobacco comments:    quit for several years.  Has recently started back.  Vaping  Use   Vaping Use: Never used  Substance and Sexual Activity   Alcohol use: Yes    Alcohol/week: 1.0 standard drink    Types: 1 Glasses of wine per week    Comment: OCC glass of wine.none last 24hrs   Drug use: No   Sexual activity: Never  Other Topics Concern   Not on file  Social History Narrative   Not on file   Social Determinants of Health   Financial Resource Strain: Low Risk    Difficulty of Paying Living Expenses: Not hard at all  Food Insecurity: No Food Insecurity   Worried About Charity fundraiser in the Last Year: Never true   [place] in the Last Year: Never true  Transportation Needs: No Transportation Needs   Lack of Transportation (Medical): No   Lack of Transportation (Non-Medical): No  Physical Activity: Sufficiently  Active   Days of Exercise per Week: 7 days   Minutes of Exercise per Session: 30 min  Stress: No Stress Concern Present   Feeling of Stress : Not at all  Social Connections: Unknown   Frequency of Communication with Friends and Family: Not on file   Frequency of Social Gatherings with Friends and Family: More than three times a week   Attends Religious Services: Not on Electrical engineer or Organizations: Not on file   Attends Archivist Meetings: Not on file   Marital Status: Not on file  Intimate Partner Violence: Not At Risk   Fear of Current or Ex-Partner: No   Emotionally Abused: No   Physically Abused: No   Sexually Abused: No    Review of Systems: See HPI, otherwise negative ROS  Physical Exam: BP (!) 141/68   Pulse 73   Temp (!) 97.1 F (36.2 C) (Temporal)   Resp 16   Ht '5\' 7"'$  (1.702 m)   Wt 66 kg   SpO2 97%   BMI 22.79 kg/m  General:   Alert,  pleasant and cooperative in NAD Head:  Normocephalic and atraumatic. Neck:  Supple; no masses or thyromegaly. Lungs:  Clear throughout to auscultation, normal respiratory effort.    Heart:  +S1, +S2, Regular rate and rhythm, No edema. Abdomen:  Soft, nontender and nondistended. Normal bowel sounds, without guarding, and without rebound.   Neurologic:  Alert and  oriented x4;  grossly normal neurologically.  Impression/Plan: Alexandra Copeland is here for an colonoscopy to be performed for positive cologuard Risks, benefits, limitations, and alternatives regarding  colonoscopy have been reviewed with the patient.  Questions have been answered.  All parties agreeable.   Jonathon Bellows, MD  01/06/2022, 10:35 AM

## 2022-01-06 NOTE — Anesthesia Preprocedure Evaluation (Addendum)
Anesthesia Evaluation  Patient identified by MRN, date of birth, ID band Patient awake    Reviewed: Allergy & Precautions, NPO status , Patient's Chart, lab work & pertinent test results  History of Anesthesia Complications Negative for: history of anesthetic complications  Airway Mallampati: III  TM Distance: <3 FB Neck ROM: full    Dental  (+) Chipped   Pulmonary neg shortness of breath, COPD, Current Smoker and Patient abstained from smoking.,    Pulmonary exam normal        Cardiovascular Exercise Tolerance: Good hypertension, (-) angina(-) Past MI and (-) DOE Normal cardiovascular exam     Neuro/Psych PSYCHIATRIC DISORDERS CVA    GI/Hepatic negative GI ROS, Neg liver ROS, neg GERD  ,  Endo/Other  diabetes, Type 2Hypothyroidism   Renal/GU negative Renal ROS  negative genitourinary   Musculoskeletal   Abdominal   Peds  Hematology negative hematology ROS (+)   Anesthesia Other Findings Past Medical History: No date: Hyperlipidemia No date: Hypertension No date: Hypothyroid No date: Scarlet fever     Comment:  as a child June 2010: Stroke [facility])     Comment:  left parietal hypertensive  Past Surgical History: 1978: BREAST CYST ASPIRATION; Left     Comment:  neg 1975: BREAST SURGERY     Comment:  abscess drained 02/20/2020: CATARACT EXTRACTION W/PHACO; Left     Comment:  Procedure: CATARACT EXTRACTION PHACO AND INTRAOCULAR               LENS PLACEMENT (IOC) LEFT DIABETIC 6.77 00:41.8;                Surgeon: [name], MD;  Location: [place];  Service: Ophthalmology;  Laterality: Left; 03/18/2020: CATARACT EXTRACTION W/PHACO; Right     Comment:  Procedure: CATARACT EXTRACTION PHACO AND INTRAOCULAR               LENS PLACEMENT (IOC) RIGHT DIABETIC 9.71  01:00.9;                Surgeon: [name], MD;  Location: [place];  Service:  Ophthalmology;  Laterality:               Right; No date: TUBAL LIGATION     Reproductive/Obstetrics negative OB ROS                            Anesthesia Physical Anesthesia Plan  ASA: 3  Anesthesia Plan: General   Post-op Pain Management:    Induction: Intravenous  PONV Risk Score and Plan: Propofol infusion and TIVA  Airway Management Planned: Natural Airway and Nasal Cannula  Additional Equipment:   Intra-op Plan:   Post-operative Plan:   Informed Consent: I have reviewed the patients History and Physical, chart, labs and discussed the procedure including the risks, benefits and alternatives for the proposed anesthesia with the patient or authorized representative who has indicated his/her understanding and acceptance.     Dental Advisory Given  Plan Discussed with: Anesthesiologist, CRNA and Surgeon  Anesthesia Plan Comments: (Patient consented for risks of anesthesia including but not limited to:  - adverse reactions to medications - risk of airway placement if required - damage to eyes, teeth, lips or other oral mucosa - nerve damage due to positioning  -  sore throat or hoarseness - Damage to heart, brain, nerves, lungs, other parts of body or loss of life  Patient voiced understanding.)        Anesthesia Quick Evaluation

## 2022-01-07 ENCOUNTER — Encounter: Payer: Self-pay | Admitting: Gastroenterology

## 2022-01-07 LAB — SURGICAL PATHOLOGY

## 2022-01-11 ENCOUNTER — Encounter: Payer: Self-pay | Admitting: Gastroenterology

## 2022-01-29 DIAGNOSIS — H26493 Other secondary cataract, bilateral: Secondary | ICD-10-CM | POA: Diagnosis not present

## 2022-01-29 DIAGNOSIS — E119 Type 2 diabetes mellitus without complications: Secondary | ICD-10-CM | POA: Diagnosis not present

## 2022-01-29 DIAGNOSIS — Z0100 Encounter for examination of eyes and vision without abnormal findings: Secondary | ICD-10-CM | POA: Diagnosis not present

## 2022-01-29 LAB — HM DIABETES EYE EXAM

## 2022-02-02 ENCOUNTER — Other Ambulatory Visit: Payer: Self-pay | Admitting: Internal Medicine

## 2022-02-18 ENCOUNTER — Other Ambulatory Visit: Payer: Self-pay | Admitting: Internal Medicine

## 2022-03-12 ENCOUNTER — Ambulatory Visit (INDEPENDENT_AMBULATORY_CARE_PROVIDER_SITE_OTHER): Payer: Medicare HMO

## 2022-03-12 VITALS — Ht 67.0 in | Wt 135.0 lb

## 2022-03-12 DIAGNOSIS — Z0000 Encounter for general adult medical examination without abnormal findings: Secondary | ICD-10-CM

## 2022-03-12 NOTE — Patient Instructions (Addendum)
  Alexandra Copeland , Thank you for taking time to come for your Medicare Wellness Visit. I appreciate your ongoing commitment to your health goals. Please review the following plan we discussed and let me know if I can assist you in the future.   These are the goals we discussed:  Goals       Patient Stated     Quit Smoking (pt-stated)      Follow up with pcp      Other     Maintain weight goal 135lb      Stay hydrated and drink plenty of fluids. Low carb foods.  Lean meats and vegetables. Stay active, monitor steps        This is a list of the screening recommended for you and due dates:  Health Maintenance  Topic Date Due   Flu Shot  03/10/2022   Complete foot exam   03/21/2022*   COVID-19 Vaccine (3 - Moderna series) 03/28/2022*   Zoster (Shingles) Vaccine (1 of 2) 06/12/2022*   Tetanus Vaccine  03/13/2023*   Hemoglobin A1C  03/19/2022   Mammogram  12/23/2022   Eye exam for diabetics  01/24/2023   Cologuard (Stool DNA test)  09/29/2024   Pneumonia Vaccine  Completed   DEXA scan (bone density measurement)  Completed   Hepatitis C Screening: USPSTF Recommendation to screen - Ages 59-79 yo.  Completed   HPV Vaccine  Aged Out  *Topic was postponed. The date shown is not the original due date.

## 2022-03-12 NOTE — Progress Notes (Addendum)
Subjective:   ARIENNE GARTIN is a 76 y.o. female who presents for Medicare Annual (Subsequent) preventive examination.  Review of Systems    No ROS.  Medicare Wellness Virtual Visit.  Visual/audio telehealth visit, UTA vital signs.   See social history for additional risk factors.   Cardiac Risk Factors include: advanced age (>21mn, >>33women)     Objective:    Today's Vitals   03/12/22 1119  Weight: 135 lb (61.2 kg)  Height: '5\' 7"'$  (1.702 m)   Body mass index is 21.14 kg/m.     03/12/2022   11:40 AM 01/06/2022   10:21 AM 03/11/2021   10:55 AM 03/18/2020    7:41 AM 02/22/2020   11:13 AM 02/20/2020    9:45 AM 02/21/2019    1:05 PM  Advanced Directives  Does Patient Have a Medical Advance Directive? No No No No No No Yes  Type of ATransport plannerLiving will  Does patient want to make changes to medical advance directive?       No - Patient declined  Copy of [place] Chart?       No - copy requested  Would patient like information on creating a medical advance directive? No - Patient declined No - Patient declined No - Patient declined No - Patient declined No - Patient declined No - Patient declined     Current Medications (verified) Outpatient Encounter Medications as of 03/12/2022  Medication Sig   amLODipine (NORVASC) 5 MG tablet Take 1 tablet by mouth once daily   aspirin 325 MG tablet Take 325 mg by mouth daily.   atorvastatin (LIPITOR) 80 MG tablet Take 1 tablet by mouth once daily   hydrochlorothiazide (HYDRODIURIL) 25 MG tablet Take 1 tablet by mouth once daily   levothyroxine (SYNTHROID) 88 MCG tablet TAKE 1 TABLET BY MOUTH ONCE DAILY BEFORE BREAKFAST   losartan (COZAAR) 100 MG tablet Take 1 tablet by mouth once daily   magnesium chloride (SLOW-MAG) 64 MG TBEC SR tablet Take 100 mg by mouth daily.   NYSTATIN powder APPLY ONE APPLICATION TWICE DAILY TO RASH UNTIL RESOLVED   triamcinolone cream (KENALOG) 0.1  % Apply 1 application topically 2 (two) times daily.   No facility-administered encounter medications on file as of 03/12/2022.    Allergies (verified) Patient has no known allergies.   History: Past Medical History:  Diagnosis Date   Hyperlipidemia    Hypertension    Hypothyroid    Scarlet fever    as a child   Stroke ([facility] June 2010   left parietal hypertensive   Past Surgical History:  Procedure Laterality Date   BREAST CYST ASPIRATION Left 1978   neg   BREAST SURGERY  1975   abscess drained   CATARACT EXTRACTION W/PHACO Left 02/20/2020   Procedure: CATARACT EXTRACTION PHACO AND INTRAOCULAR LENS PLACEMENT (IOC) LEFT DIABETIC 6.77 00:41.8;  Surgeon: KEulogio Bear MD;  [facility]  Service: Ophthalmology;  Laterality: Left;   CATARACT EXTRACTION W/PHACO Right 03/18/2020   Procedure: CATARACT EXTRACTION PHACO AND INTRAOCULAR LENS PLACEMENT (IOC) RIGHT DIABETIC 9.71  01:00.9;  Surgeon: KEulogio Bear MD;  [facility]  Service: Ophthalmology;  Laterality: Right;   COLONOSCOPY WITH PROPOFOL N/A 01/06/2022   Procedure: COLONOSCOPY WITH PROPOFOL;  Surgeon: AJonathon Bellows MD;  [facility]  Service: Gastroenterology;  Laterality: N/A;   TUBAL LIGATION     Family History  Problem Relation Age of Onset   Heart disease Mother    Hypertension Mother    Stroke Father    Hyperlipidemia Father    Vision loss Father    Hypertension Brother    Rheum arthritis Brother    Diabetes Paternal Grandmother    Breast cancer Neg Hx    Social History   Socioeconomic History   Marital status: Single    Spouse name: Not on file   Number of children: Not on file   Years of education: Not on file   Highest education level: Not on file  Occupational History   Not on file  Tobacco Use   Smoking status: Every Day    Packs/day: 1.00    Years: 26.00    Total pack years: 26.00    Types: Cigarettes    Start date: 05/10/2018   Smokeless  tobacco: Never   Tobacco comments:    quit for several years.  Has recently started back.  Vaping Use   Vaping Use: Never used  Substance and Sexual Activity   Alcohol use: Yes    Alcohol/week: 1.0 standard drink of alcohol    Types: 1 Glasses of wine per week    Comment: OCC glass of wine.none last 24hrs   Drug use: No   Sexual activity: Never  Other Topics Concern   Not on file  Social History Narrative   Not on file   Social Determinants of Health   Financial Resource Strain: Low Risk  (03/12/2022)   Overall Financial Resource Strain (CARDIA)    Difficulty of Paying Living Expenses: Not hard at all  Food Insecurity: No Food Insecurity (03/12/2022)   Hunger Vital Sign    Worried About Running Out of Food in the Last Year: Never true    Ran Out of Food in the Last Year: Never true  Transportation Needs: No Transportation Needs (03/12/2022)   PRAPARE - Hydrologist (Medical): No    Lack of Transportation (Non-Medical): No  Physical Activity: Sufficiently Active (03/12/2022)   Exercise Vital Sign    Days of Exercise per Week: 7 days    Minutes of Exercise per Session: 30 min  Stress: No Stress Concern Present (03/12/2022)   [place]    Feeling of Stress : Not at all  Social Connections: Unknown (03/12/2022)   Social Connection and Isolation Panel [NHANES]    Frequency of Communication with Friends and Family: Not on file    Frequency of Social Gatherings with Friends and Family: More than three times a week    Attends Religious Services: Not on file    Active Member of Clubs or Organizations: Not on file    Attends Archivist Meetings: Not on file    Marital Status: Not on file    Tobacco Counseling Ready to quit: Not Answered Counseling given: Not Answered Tobacco comments: quit for several years.  Has recently started back.   Clinical Intake:  Pre-visit preparation  completed: Yes        Diabetes: Yes (Followed by PCP)  How often do you need to have someone help you when you read instructions, pamphlets, or other written materials from your doctor or pharmacy?: 1 - Never   Interpreter Needed?: No    Activities of Daily Living    03/12/2022   11:21 AM  In your present state of health, do you have any difficulty performing the following activities:  Preparing Food and eating ? N  Using the Toilet? N  In the past six months, have you accidently leaked urine? N  Do you have problems with loss of bowel control? N  Managing your Medications? N  Managing your Finances? N  Housekeeping or managing your Housekeeping? N   Patient Care Team: [name], MD as PCP - General (Internal Medicine)  Indicate any recent Medical Services you may have received from other than [facility] providers in the past year (date may be approximate).     Assessment:   This is a routine wellness examination for [place].  Virtual Visit via Telephone Note  I connected with  Bubba Hales on 03/12/22 at 11:15 AM EDT by telephone and verified that I am speaking with the correct person using two identifiers.  Location: Patient: home  Provider: office Persons participating in the virtual visit: patient/Nurse Health Advisor   I discussed the limitations of performing an evaluation and management service by telehealth. We continued and completed visit with audio only. Some vital signs may be absent or patient reported.   Hearing/Vision screen Hearing Screening - Comments:: Patient is able to hear conversational tones without difficulty. No issues reported. Vision Screening - Comments:: Followed by [facility] Cataract extraction, bilateral They have seen their ophthalmologist in the last 12 months.   Dietary issues and exercise activities discussed: Current Exercise Habits: Home exercise routine, Type of exercise: treadmill;walking (Water aerobics), Time  (Minutes): > 60, Frequency (Times/Week): 5, Weekly Exercise (Minutes/Week): 0, Intensity: Mild Healthy diet Good water intake   Goals Addressed             This Visit's Progress    Maintain weight goal 135lb       Stay hydrated and drink plenty of fluids. Low carb foods.  Lean meats and vegetables. Stay active, monitor steps       Depression Screen    03/12/2022   11:40 AM 09/19/2021   10:32 AM 03/19/2021    1:53 PM 03/11/2021   10:55 AM 09/16/2020    2:09 PM 02/22/2020   11:14 AM 02/21/2019    1:10 PM  PHQ 2/9 Scores  PHQ - 2 Score 0 0 0 0 0 0 0  PHQ- 9 Score   0        Fall Risk    09/19/2021   10:32 AM 03/19/2021    1:32 PM 03/11/2021   10:56 AM 09/16/2020    2:09 PM 03/14/2020    9:38 AM  Fall Risk   Falls in the past year? 0 0 0 1 0  Number falls in past yr:   0 0   Injury with Fall?   0 0   Risk for fall due to : No Fall Risks      Follow up Falls evaluation completed Falls evaluation completed Falls evaluation completed Falls evaluation completed Falls evaluation completed    [place]: Home free of loose throw rugs in walkways, pet beds, electrical cords, etc? Yes  Adequate lighting in your home to reduce risk of falls? Yes   ASSISTIVE DEVICES UTILIZED TO PREVENT FALLS: Life alert? No  Use of a cane, walker or w/c? No   TIMED UP AND GO: Was the test performed? No .   Cognitive Function: Patient is alert and oriented x3.      02/22/2020   11:29 AM 12/16/2017    9:46 AM 12/10/2016   10:46 AM 12/11/2015  10:21 AM  MMSE - Mini Mental State Exam  Not completed: Unable to complete     Orientation to time  '5 5 5  '$ Orientation to Place  '5 5 5  '$ Registration  '3 3 3  '$ Attention/ Calculation  '5 5 5  '$ Recall  '3 3 3  '$ Language- name 2 objects  '2 2 2  '$ Language- repeat  '1 1 1  '$ Language- follow 3 step command  '3 3 3  '$ Language- read & follow direction  '1 1 1  '$ Write a sentence  '1 1 1  '$ Copy design  '1 1 1  '$ Total score  '30 30 30         '$ 02/21/2019    1:11 PM  6CIT Screen  What Year? 0 points  What month? 0 points  What time? 0 points  Count back from 20 0 points  Months in reverse 0 points  Repeat phrase 0 points  Total Score 0 points    Immunizations Immunization History  Administered Date(s) Administered   Influenza, High Dose Seasonal PF 08/11/2018   Influenza,inj,Quad PF,6+ Mos 05/24/2013, 04/30/2014   Moderna SARS-COV2 Booster Vaccination 07/01/2020   Moderna Sars-Covid-2 Vaccination 10/02/2019, 10/31/2019   PPD Test 09/18/2014   Pneumococcal Conjugate-13 05/28/2014   Pneumococcal Polysaccharide-23 05/29/2010, 08/18/2018   Tdap 05/28/2010   TDAP status: Due, Education has been provided regarding the importance of this vaccine. Advised may receive this vaccine at local pharmacy or Health Dept. Aware to provide a copy of the vaccination record if obtained from local pharmacy or Health Dept. Verbalized acceptance and understanding.  Shingrix Completed?: No.    Education has been provided regarding the importance of this vaccine. Patient has been advised to call insurance company to determine out of pocket expense if they have not yet received this vaccine. Advised may also receive vaccine at local pharmacy or Health Dept. Verbalized acceptance and understanding.  Screening Tests Health Maintenance  Topic Date Due   INFLUENZA VACCINE  03/10/2022   FOOT EXAM  03/21/2022 (Originally 03/14/2021)   COVID-19 Vaccine (3 - Moderna series) 03/28/2022 (Originally 08/26/2020)   Zoster Vaccines- Shingrix (1 of 2) 06/12/2022 (Originally 11/10/1996)   TETANUS/TDAP  03/13/2023 (Originally 05/28/2020)   HEMOGLOBIN A1C  03/19/2022   MAMMOGRAM  12/23/2022   OPHTHALMOLOGY EXAM  01/24/2023   Fecal DNA (Cologuard)  09/29/2024   Pneumonia Vaccine 45+ Years old  Completed   DEXA SCAN  Completed   Hepatitis C Screening  Completed   HPV VACCINES  Aged Out   Health Maintenance Health Maintenance Due  Topic Date Due   INFLUENZA  VACCINE  03/10/2022   Lung Cancer Screening: (Low Dose CT Chest recommended if Age 1-80 years, 30 pack-year currently smoking OR have quit w/in 15years.) does not qualify.   Vision Screening: Recommended annual ophthalmology exams for early detection of glaucoma and other disorders of the eye.  Dental Screening: Recommended annual dental exams for proper oral hygiene  Community Resource Referral / Chronic Care Management: CRR required this visit?  No   CCM required this visit?  No      Plan:   Keep all routine maintenance appointments.   I have personally reviewed and noted the following in the patient's chart:   Medical and social history Use of alcohol, tobacco or illicit drugs  Current medications and supplements including opioid prescriptions.  Functional ability and status Nutritional status Physical activity Advanced directives List of other physicians Hospitalizations, surgeries, and ER visits in previous 12 months Vitals  Screenings to include cognitive, depression, and falls Referrals and appointments  In addition, I have reviewed and discussed with patient certain preventive protocols, quality metrics, and best practice recommendations. A written personalized care plan for preventive services as well as general preventive health recommendations were provided to patient.     OBrien-Blaney, Adalynn Corne L, LPN   04/16/4717      I have reviewed the above information and agree with above.   Deborra Medina, MD

## 2022-03-23 ENCOUNTER — Ambulatory Visit: Payer: Medicare HMO | Admitting: Internal Medicine

## 2022-03-26 ENCOUNTER — Ambulatory Visit (INDEPENDENT_AMBULATORY_CARE_PROVIDER_SITE_OTHER): Payer: Medicare HMO | Admitting: Internal Medicine

## 2022-03-26 ENCOUNTER — Telehealth: Payer: Self-pay

## 2022-03-26 ENCOUNTER — Encounter: Payer: Self-pay | Admitting: Internal Medicine

## 2022-03-26 VITALS — BP 170/66 | HR 45 | Temp 97.4°F | Ht 67.0 in | Wt 136.8 lb

## 2022-03-26 DIAGNOSIS — R001 Bradycardia, unspecified: Secondary | ICD-10-CM | POA: Diagnosis not present

## 2022-03-26 DIAGNOSIS — E034 Atrophy of thyroid (acquired): Secondary | ICD-10-CM

## 2022-03-26 DIAGNOSIS — R634 Abnormal weight loss: Secondary | ICD-10-CM | POA: Insufficient documentation

## 2022-03-26 DIAGNOSIS — E785 Hyperlipidemia, unspecified: Secondary | ICD-10-CM

## 2022-03-26 DIAGNOSIS — E1169 Type 2 diabetes mellitus with other specified complication: Secondary | ICD-10-CM

## 2022-03-26 DIAGNOSIS — K219 Gastro-esophageal reflux disease without esophagitis: Secondary | ICD-10-CM | POA: Insufficient documentation

## 2022-03-26 DIAGNOSIS — I10 Essential (primary) hypertension: Secondary | ICD-10-CM

## 2022-03-26 DIAGNOSIS — K2100 Gastro-esophageal reflux disease with esophagitis, without bleeding: Secondary | ICD-10-CM

## 2022-03-26 DIAGNOSIS — E1159 Type 2 diabetes mellitus with other circulatory complications: Secondary | ICD-10-CM | POA: Diagnosis not present

## 2022-03-26 DIAGNOSIS — I152 Hypertension secondary to endocrine disorders: Secondary | ICD-10-CM

## 2022-03-26 HISTORY — DX: Gastro-esophageal reflux disease without esophagitis: K21.9

## 2022-03-26 LAB — COMPREHENSIVE METABOLIC PANEL
ALT: 22 U/L (ref 0–35)
AST: 25 U/L (ref 0–37)
Albumin: 4.3 g/dL (ref 3.5–5.2)
Alkaline Phosphatase: 81 U/L (ref 39–117)
BUN: 21 mg/dL (ref 6–23)
CO2: 31 mEq/L (ref 19–32)
Calcium: 9.2 mg/dL (ref 8.4–10.5)
Chloride: 102 mEq/L (ref 96–112)
Creatinine, Ser: 0.67 mg/dL (ref 0.40–1.20)
GFR: 85.53 mL/min (ref >60.00)
Glucose, Bld: 102 mg/dL — ABNORMAL HIGH (ref 70–99)
Potassium: 3.8 mEq/L (ref 3.5–5.1)
Sodium: 142 mEq/L (ref 135–145)
Total Bilirubin: 0.6 mg/dL (ref 0.2–1.2)
Total Protein: 6.3 g/dL (ref 6.0–8.3)

## 2022-03-26 LAB — LIPID PANEL
Cholesterol: 182 mg/dL (ref 0–200)
HDL: 47.3 mg/dL (ref >39.00)
LDL Cholesterol: 111 mg/dL — ABNORMAL HIGH (ref 0–99)
NonHDL: 134.85
Total CHOL/HDL Ratio: 4
Triglycerides: 119 mg/dL (ref 0.0–149.0)
VLDL: 23.8 mg/dL (ref 0.0–40.0)

## 2022-03-26 LAB — TSH: TSH: 2.05 u[IU]/mL (ref 0.35–5.50)

## 2022-03-26 LAB — LDL CHOLESTEROL, DIRECT: Direct LDL: 111 mg/dL

## 2022-03-26 LAB — HEMOGLOBIN A1C: Hgb A1c MFr Bld: 6.1 % (ref 4.6–6.5)

## 2022-03-26 MED ORDER — ATORVASTATIN CALCIUM 80 MG PO TABS: 40.0000 mg | ORAL_TABLET | Freq: Every day | ORAL | 3 refills | Status: DC

## 2022-03-26 NOTE — Telephone Encounter (Signed)
At check-out today, scheduled patient's nurse visit in one week on 04/02/2022.  Scheduled patient's follow-up with Dr. Deborra Medina on 04/06/2022, which was next available.

## 2022-03-26 NOTE — Assessment & Plan Note (Signed)
Home BP readings are MUCH lower than in the office.  Needs RN visit in 1 week for BP check and home machine calibration.  Advised to resume losartan and sto phctz and amlodipine given bradycardia

## 2022-03-26 NOTE — Assessment & Plan Note (Signed)
With flatulence and nausea.  aggravated by high fruit diet  Improved now with omeprazole and dietary restriction

## 2022-03-26 NOTE — Assessment & Plan Note (Signed)
New onset.  Etiology unclear. I have ordered and reviewed a 12 lead EKG and find that there are no acute changes and patient is in sinus bradycardia

## 2022-03-26 NOTE — Telephone Encounter (Signed)
Noted  

## 2022-03-26 NOTE — Progress Notes (Signed)
Subjective:  Patient ID: Alexandra Copeland, female    DOB: October 18, 1945  Age: 76 y.o. MRN: [id number]  CC: The primary encounter diagnosis was Hyperlipidemia associated with type 2 diabetes mellitus ([place]). Diagnoses of Hypothyroidism due to acquired atrophy of thyroid, Hypertension associated with type 2 diabetes mellitus ([place]), Unintentional weight loss of less than 10% body weight within 6 months, Gastroesophageal reflux disease with esophagitis without hemorrhage, and Bradycardia were also pertinent to this visit.   HPI Alexandra Copeland presents for  Chief Complaint  Patient presents with   Follow-up    6 month follow up on diabetes    1) Type 2 DM:  she feels generally well, is exercising several times per week and checking blood sugars once daily at variable times.  BS have been under 130 fasting and < 150 post prandially.  Denies any recent hypoglyemic events.  Taking his medications as directed. Following a carbohydrate modified diet 6 days per week. Denies numbness, burning and tingling of extremities. Appetite is good.      2) HTN: home readings have been < 120 and pulse is in the high 40's  despite stopping losartan.  Still taking amlodipine in the am and hctz in the am.  Stopped lipitor one week ago   Outpatient Medications Prior to Visit  Medication Sig Dispense Refill   aspirin 325 MG tablet Take 325 mg by mouth daily.     levothyroxine (SYNTHROID) 88 MCG tablet TAKE 1 TABLET BY MOUTH ONCE DAILY BEFORE BREAKFAST 90 tablet 1   losartan (COZAAR) 100 MG tablet Take 1 tablet by mouth once daily 90 tablet 0   magnesium chloride (SLOW-MAG) 64 MG TBEC SR tablet Take 100 mg by mouth daily.     NYSTATIN powder APPLY ONE APPLICATION TWICE DAILY TO RASH UNTIL RESOLVED 15 g 0   triamcinolone cream (KENALOG) 0.1 % Apply 1 application topically 2 (two) times daily. 80 g 0   amLODipine (NORVASC) 5 MG tablet Take 1 tablet by mouth once daily 90 tablet 1   atorvastatin (LIPITOR) 80 MG tablet  Take 1 tablet by mouth once daily 90 tablet 3   hydrochlorothiazide (HYDRODIURIL) 25 MG tablet Take 1 tablet by mouth once daily 90 tablet 1   No facility-administered medications prior to visit.    Review of Systems;  Patient denies headache, fevers, malaise, unintentional weight loss, skin rash, eye pain, sinus congestion and sinus pain, sore throat, dysphagia,  hemoptysis , cough, dyspnea, wheezing, chest pain, palpitations, orthopnea, edema, abdominal pain, nausea, melena, diarrhea, constipation, flank pain, dysuria, hematuria, urinary  Frequency, nocturia, numbness, tingling, seizures,  Focal weakness, Loss of consciousness,  Tremor, insomnia, depression, anxiety, and suicidal ideation.      Objective:  BP (!) 170/66 (BP Location: Left Arm, Patient Position: Sitting, Cuff Size: Normal)   Pulse (!) 45   Temp (!) 97.4 F (36.3 C) (Oral)   Ht 5' 7"  (1.702 m)   Wt 136 lb 12.8 oz (62.1 kg)   SpO2 98%   BMI 21.43 kg/m   BP Readings from Last 3 Encounters:  03/26/22 (!) 170/66  01/06/22 (!) 126/48  12/29/21 (!) 197/70    Wt Readings from Last 3 Encounters:  03/26/22 136 lb 12.8 oz (62.1 kg)  03/12/22 135 lb (61.2 kg)  01/06/22 145 lb 8.1 oz (66 kg)    General appearance: alert, cooperative and appears stated age Ears: normal TM's and external ear canals both ears Throat: lips, mucosa, and tongue normal; teeth and gums  normal Neck: no adenopathy, no carotid bruit, supple, symmetrical, trachea midline and thyroid not enlarged, symmetric, no tenderness/mass/nodules Back: symmetric, no curvature. ROM normal. No CVA tenderness. Lungs: clear to auscultation bilaterally Heart: regular rate and rhythm, S1, S2 normal, no murmur, click, rub or gallop Abdomen: soft, non-tender; bowel sounds normal; no masses,  no organomegaly Pulses: 2+ and symmetric Skin: Skin color, texture, turgor normal. No rashes or lesions Lymph nodes: Cervical, supraclavicular, and axillary nodes normal.  Lab  Results  Component Value Date   HGBA1C 6.1 03/26/2022   HGBA1C 5.6 09/19/2021   HGBA1C 6.2 03/19/2021    Lab Results  Component Value Date   CREATININE 0.67 03/26/2022   CREATININE 0.78 09/19/2021   CREATININE 0.82 03/19/2021    Lab Results  Component Value Date   WBC 5.3 09/19/2021   HGB 15.1 (H) 09/19/2021   HCT 45.2 09/19/2021   PLT 189.0 09/19/2021   GLUCOSE 102 (H) 03/26/2022   CHOL 182 03/26/2022   TRIG 119.0 03/26/2022   HDL 47.30 03/26/2022   LDLDIRECT 111.0 03/26/2022   LDLCALC 111 (H) 03/26/2022   ALT 22 03/26/2022   AST 25 03/26/2022   NA 142 03/26/2022   K 3.8 03/26/2022   CL 102 03/26/2022   CREATININE 0.67 03/26/2022   BUN 21 03/26/2022   CO2 31 03/26/2022   TSH 2.05 03/26/2022   HGBA1C 6.1 03/26/2022   MICROALBUR <0.7 09/19/2021    No results found.  Assessment & Plan:   Problem List Items Addressed This Visit     Bradycardia    New onset.  Etiology unclear. I have ordered and reviewed a 12 lead EKG and find that there are no acute changes and patient is in sinus bradycardia         Relevant Orders   EKG 12-Lead (Completed)   GERD (gastroesophageal reflux disease)    With flatulence and nausea.  aggravated by high fruit diet  Improved now with omeprazole and dietary restriction       Hyperlipidemia associated with type 2 diabetes mellitus ([place]) - Primary    He is tolerating  atorvastatin and LFTs are normal.  No changes today  Lab Results  Component Value Date   CHOL 182 03/26/2022   HDL 47.30 03/26/2022   LDLCALC 111 (H) 03/26/2022   LDLDIRECT 111.0 03/26/2022   TRIG 119.0 03/26/2022   CHOLHDL 4 03/26/2022         Relevant Medications   atorvastatin (LIPITOR) 80 MG tablet   Other Relevant Orders   Lipid Profile (Completed)   Direct LDL (Completed)   Hypertension associated with type 2 diabetes mellitus ([place])    Home BP readings are MUCH lower than in the office.  Needs RN visit in 1 week for BP check and home machine  calibration.  Advised to resume losartan and sto phctz and amlodipine given bradycardia       Relevant Medications   atorvastatin (LIPITOR) 80 MG tablet   Other Relevant Orders   HgB A1c (Completed)   Comp Met (CMET) (Completed)   Hypothyroidism    Thyroid function is WNL on 88 mcg levothyroxine  No current changes needed.   Lab Results  Component Value Date   TSH 2.05 03/26/2022         Relevant Orders   TSH (Completed)   Unintentional weight loss of less than 10% body weight within 6 months    She has lost an excessive amount of  weight following the [employer].  [place]  lbs .  She reached a nadir of 131 lbs at home scale and has gained 4 lbs.           I spent a total of   minutes with this patient in a face to face visit on the date of this encounter reviewing the last office visit with me on        ,  most recent with patient's cardiologist in    ,  patient'ss diet and eating habits, home blood pressure readings ,  most recent imaging study ,   and post visit ordering of testing and therapeutics.    Follow-up: Return in about 1 week (around 04/02/2022).   [name], MD

## 2022-03-26 NOTE — Patient Instructions (Signed)
I proprose that you:  Resume losartan 100 mg daily  at night.  Stop the hctz and the amlodipine.  Return in one week with BP machine for an RN visit.    Reduce  atorvastatin  to 1/2 tablet every other  day

## 2022-03-26 NOTE — Assessment & Plan Note (Addendum)
She has lost an excessive amount of  weight following the [employer].  29 lbs .  She reached a nadir of 131 lbs at home scale and has gained 4 lbs.

## 2022-03-28 NOTE — Assessment & Plan Note (Signed)
Thyroid function is WNL on 88 mcg levothyroxine  No current changes needed.   Lab Results  Component Value Date   TSH 2.05 03/26/2022

## 2022-03-28 NOTE — Assessment & Plan Note (Signed)
He is tolerating  atorvastatin and LFTs are normal.  No changes today  Lab Results  Component Value Date   CHOL 182 03/26/2022   HDL 47.30 03/26/2022   LDLCALC 111 (H) 03/26/2022   LDLDIRECT 111.0 03/26/2022   TRIG 119.0 03/26/2022   CHOLHDL 4 03/26/2022

## 2022-04-02 ENCOUNTER — Other Ambulatory Visit: Payer: Self-pay | Admitting: Internal Medicine

## 2022-04-02 ENCOUNTER — Ambulatory Visit (INDEPENDENT_AMBULATORY_CARE_PROVIDER_SITE_OTHER): Payer: Medicare HMO

## 2022-04-02 VITALS — BP 148/64 | HR 47

## 2022-04-02 DIAGNOSIS — R001 Bradycardia, unspecified: Secondary | ICD-10-CM

## 2022-04-02 NOTE — Progress Notes (Addendum)
Patient came in today for blood pressure check against patient's home machine. Home machine reading done in left arm was 148/80 P 47. When checked with manual normal sized cuff BP was 148/64 P 48. Dr. Derrel Nip was consulted and was decided to do EKG due to low pulse rate. EKG performed & results given to Dr. Derrel Nip who them spoke with patient. Patent was referred to cardiology & was cleared for my letting her go.     I have reviewed the above information and agree with above.   I have ordered and reviewed a 12 lead EKG and find that there [place] a subacute change as  patient is in sinus bradycardia      Deborra Medina, MD

## 2022-04-06 ENCOUNTER — Ambulatory Visit: Payer: Medicare HMO | Admitting: Internal Medicine

## 2022-04-19 ENCOUNTER — Encounter: Payer: Self-pay | Admitting: Internal Medicine

## 2022-04-21 MED ORDER — ATORVASTATIN CALCIUM 20 MG PO TABS: 20.0000 mg | ORAL_TABLET | Freq: Every day | ORAL | 3 refills | Status: DC

## 2022-05-25 ENCOUNTER — Other Ambulatory Visit: Payer: Self-pay | Admitting: Internal Medicine

## 2022-05-28 ENCOUNTER — Ambulatory Visit (INDEPENDENT_AMBULATORY_CARE_PROVIDER_SITE_OTHER): Payer: Medicare HMO

## 2022-05-28 ENCOUNTER — Ambulatory Visit: Payer: Medicare HMO | Attending: Cardiology | Admitting: Cardiology

## 2022-05-28 ENCOUNTER — Encounter: Payer: Self-pay | Admitting: Cardiology

## 2022-05-28 VITALS — BP 180/72 | HR 59 | Ht 67.0 in | Wt 144.8 lb

## 2022-05-28 DIAGNOSIS — I10 Essential (primary) hypertension: Secondary | ICD-10-CM | POA: Diagnosis not present

## 2022-05-28 DIAGNOSIS — R001 Bradycardia, unspecified: Secondary | ICD-10-CM

## 2022-05-28 DIAGNOSIS — E7800 Pure hypercholesterolemia, unspecified: Secondary | ICD-10-CM

## 2022-05-28 NOTE — Progress Notes (Signed)
Cardiology Office Note:    Date:  05/28/2022   ID:  Alexandra Copeland, DOB February 28, 1946, MRN [id number]  PCP:  [name], MD   [facility] Cardiologist:  Kate Sable, MD     Referring MD: [name], MD   Chief Complaint  Patient presents with   New Patient (Initial Visit)    Bradycardia, PCP ref, no concerns today     History of Present Illness:    Alexandra Copeland is a 76 y.o. female with a hx of hypertension, hyperlipidemia, CVA, whitecoat syndrome, who presents due to bradycardia.  She is a retired Marine scientist, states having a left parietal stroke back in 2009.  Was noted to be bradycardic at the time.  Was placed on aspirin and Lipitor 80.  Developed some muscle aches, Lipitor dose decreased by PCP.  She also lost some weight which has made her to feel well overall.  Currently denies any muscle aches.  Able to work in her yard without any symptoms of chest pain, shortness of breath.  Had 1 episode of feeling dizzy when working outside in the heat, attributed this to dehydration.    Saw PCP recently, EKG obtained 03/26/2022 showed sinus bradycardia heart rate 42.  EKG in 2009 showed sinus rhythm heart rate 68.   Past Medical History:  Diagnosis Date   Hyperlipidemia    Hypertension    Hypothyroid    Scarlet fever    as a child   Stroke [facility]) June 2010   left parietal hypertensive    Past Surgical History:  Procedure Laterality Date   BREAST CYST ASPIRATION Left 1978   neg   BREAST SURGERY  1975   abscess drained   CATARACT EXTRACTION W/PHACO Left 02/20/2020   Procedure: CATARACT EXTRACTION PHACO AND INTRAOCULAR LENS PLACEMENT (IOC) LEFT DIABETIC 6.77 00:41.8;  Surgeon: [name], MD;  Location: [facility];  Service: Ophthalmology;  Laterality: Left;   CATARACT EXTRACTION W/PHACO Right 03/18/2020   Procedure: CATARACT EXTRACTION PHACO AND INTRAOCULAR LENS PLACEMENT (IOC) RIGHT DIABETIC 9.71  01:00.9;  Surgeon: [name], MD;  Location: [facility];  Service: Ophthalmology;  Laterality: Right;   COLONOSCOPY WITH PROPOFOL N/A 01/06/2022   Procedure: COLONOSCOPY WITH PROPOFOL;  Surgeon: Jonathon Bellows, MD;  Location: [facility] ENDOSCOPY;  Service: Gastroenterology;  Laterality: N/A;   TUBAL LIGATION      Current Medications: Current Meds  Medication Sig   aspirin 325 MG tablet Take 325 mg by mouth daily.   atorvastatin (LIPITOR) 20 MG tablet Take 1 tablet (20 mg total) by mouth daily.   levothyroxine (SYNTHROID) 88 MCG tablet TAKE 1 TABLET BY MOUTH ONCE DAILY BEFORE BREAKFAST   losartan (COZAAR) 100 MG tablet Take 1 tablet by mouth once daily (Patient taking differently: Take 100 mg by mouth at bedtime.)   magnesium chloride (SLOW-MAG) 64 MG TBEC SR tablet Take 100 mg by mouth daily.   NYSTATIN powder APPLY ONE APPLICATION TWICE DAILY TO RASH UNTIL RESOLVED   triamcinolone cream (KENALOG) 0.1 % Apply 1 application topically 2 (two) times daily.     Allergies:   Patient has no known allergies.   Social History   Socioeconomic History   Marital status: Single    Spouse name: Not on file   Number of children: Not on file   Years of education: Not on file   Highest education level: Not on file  Occupational History   Not on file  Tobacco Use  Smoking status: Every Day    Packs/day: 1.00    Years: 26.00    Total pack years: 26.00    Types: Cigarettes    Start date: 05/10/2018   Smokeless tobacco: Never   Tobacco comments:    quit for several years.  Has recently started back.  Vaping Use   Vaping Use: Never used  Substance and Sexual Activity   Alcohol use: Not Currently    Alcohol/week: 1.0 standard drink of alcohol    Types: 1 Glasses of wine per week    Comment: OCC glass of wine.none last 24hrs   Drug use: No   Sexual activity: Never  Other Topics Concern   Not on file  Social History Narrative   Not on file   Social Determinants of Health   Financial Resource Strain: Low Risk   (03/12/2022)   Overall Financial Resource Strain (CARDIA)    Difficulty of Paying Living Expenses: Not hard at all  Food Insecurity: No Food Insecurity (03/12/2022)   Hunger Vital Sign    Worried About Running Out of Food in the Last Year: Never true    Ran Out of Food in the Last Year: Never true  Transportation Needs: No Transportation Needs (03/12/2022)   PRAPARE - Hydrologist (Medical): No    Lack of Transportation (Non-Medical): No  Physical Activity: Sufficiently Active (03/12/2022)   Exercise Vital Sign    Days of Exercise per Week: 7 days    Minutes of Exercise per Session: 30 min  Stress: No Stress Concern Present (03/12/2022)   [place]    Feeling of Stress : Not at all  Social Connections: Unknown (03/12/2022)   Social Connection and Isolation Panel [NHANES]    Frequency of Communication with Friends and Family: Not on file    Frequency of Social Gatherings with Friends and Family: More than three times a week    Attends Religious Services: Not on Advertising copywriter or Organizations: Not on file    Attends Archivist Meetings: Not on file    Marital Status: Not on file     Family History: The patient's family history includes Diabetes in her paternal grandmother; Heart disease in her mother; Hyperlipidemia in her father; Hypertension in her brother and mother; Rheum arthritis in her brother; Stroke in her father; Vision loss in her father. There is no history of Breast cancer.  ROS:   Please see the history of present illness.     All other systems reviewed and are negative.  EKGs/Labs/Other Studies Reviewed:    The following studies were reviewed today:  EKG:  EKG is  ordered today.  The ekg ordered today demonstrates sinus bradycardia heart rate 59  Recent Labs: 09/19/2021: Hemoglobin 15.1; Platelets 189.0 03/26/2022: ALT 22; BUN 21; Creatinine, Ser 0.67;  Potassium 3.8; Sodium 142; TSH 2.05  Recent Lipid Panel    Component Value Date/Time   CHOL 182 03/26/2022 1109   TRIG 119.0 03/26/2022 1109   HDL 47.30 03/26/2022 1109   CHOLHDL 4 03/26/2022 1109   VLDL 23.8 03/26/2022 1109   LDLCALC 111 (H) 03/26/2022 1109   LDLDIRECT 111.0 03/26/2022 1109     Risk Assessment/Calculations:     Physical Exam:    VS:  BP (!) 180/72 (BP Location: Left Arm, Patient Position: Sitting, Cuff Size: Normal)   Pulse (!) 59   Ht '5\' 7"'$  (1.702 m)  Wt 144 lb 12.8 oz (65.7 kg)   SpO2 98%   BMI 22.68 kg/m     Wt Readings from Last 3 Encounters:  05/28/22 144 lb 12.8 oz (65.7 kg)  03/26/22 136 lb 12.8 oz (62.1 kg)  03/12/22 135 lb (61.2 kg)     GEN:  Well nourished, well developed in no acute distress HEENT: Normal NECK: No JVD; No carotid bruits CARDIAC: RRR, no murmurs, rubs, gallops RESPIRATORY:  Clear to auscultation without rales, wheezing or rhonchi  ABDOMEN: Soft, non-tender, non-distended MUSCULOSKELETAL:  No edema; No deformity  SKIN: Warm and dry NEUROLOGIC:  Alert and oriented x 3 PSYCHIATRIC:  Normal affect   ASSESSMENT:    1. Bradycardia   2. White coat syndrome with diagnosis of hypertension   3. Pure hypercholesterolemia    PLAN:    In order of problems listed above:  Sinus bradycardia, heart rate 59 today.  Previous EKGs showing sinus bradycardia as low as 42 bpm.  Place cardiac monitor to rule out any higher degree AV block.  She is otherwise asymptomatic.  No further testing indicated if monitor shows no significant AV blocks. Hypertension, BP better at home.  Component of whitecoat syndrome.  Continue losartan 100 mg daily. Hyperlipidemia, history of CVA, muscle aches on higher doses of Lipitor.  Continue Lipitor 20 as she is tolerating this dose.  Follow-up after cardiac monitor      Medication Adjustments/Labs and Tests Ordered: Current medicines are reviewed at length with the patient today.  Concerns regarding  medicines are outlined above.  Orders Placed This Encounter  Procedures   LONG TERM MONITOR (3-14 DAYS)   EKG 12-Lead   No orders of the defined types were placed in this encounter.   Patient Instructions  Medication Instructions:   Your physician recommends that you continue on your current medications as directed. Please refer to the Current Medication list given to you today.   *If you need a refill on your cardiac medications before your next appointment, please call your pharmacy*   Testing/Procedures:  Your physician has recommended that you wear a Zio XT monitor for 2 weeks. This will be mailed to your home address in 4-5 business days.   Your clinician has requested a Zio heart rhythm monitor by [employer] to be mailed to your home for you to wear for 14 days. You should expect a small box to arrive via USPS (or FedEx in some cases) within this next week. If you do not receive it please call [employer] at [phone].  Closely watching your heart at this time will help your care team understand more and provide information needed to develop your plan of care.  Please apply your Zio patch monitor the day you receive it. Keep this packaging, you will use this to return your Zio monitor.  You will easily be able to apply the monitor with the instructions provided in the Patient Guide.  If you need assistance, [employer] representatives are available 24/7 at [phone].  You can also download the [facility] app on your phone to view detailed application instructions and log symptoms.  After you wear your monitor for 14 days, place it back in the blue box or envelope, along with your Symptom Log.  To send your monitor back: Simply use the pre-addressed and pre-paid box/envelope.  Send it back through [employer] the same day you remove it via your local post office or by placing it in your mailbox.  As soon as we receive the  results, they will be reviewed and your clinician will  contact you.  For the first 24 hours- it is essential to not shower or exercise, to allow the patch to adhere to your skin. Avoid excessive sweating to help maximize wear time. Do not submerge the device, no hot tubs, and no swimming pools. Keep any lotions or oils away from the patch. After 24 hours you may shower with the patch on. Take brief showers with your back facing the shower head.  Do not remove patch once it has been placed because that will interrupt data and decrease adhesive wear time. Push the button when you have any symptoms and write down what you were feeling. Once you have completed wearing your monitor, remove and place into box which has postage paid and place in your outgoing mailbox.  If for some reason you have misplaced your box then call our office and we can provide another box and/or mail it off for you.    Follow-Up: At [facility], you and your health needs are our priority.  As part of our continuing mission to provide you with exceptional heart care, we have created designated Provider Care Teams.  These Care Teams include your primary Cardiologist (physician) and Advanced Practice Providers (APPs -  Physician Assistants and Nurse Practitioners) who all work together to provide you with the care you need, when you need it.  We recommend signing up for the patient portal called "MyChart".  Sign up information is provided on this After Visit Summary.  MyChart is used to connect with patients for Virtual Visits (Telemedicine).  Patients are able to view lab/test results, encounter notes, upcoming appointments, etc.  Non-urgent messages can be sent to your provider as well.   To learn more about what you can do with MyChart, go to [email or web address].    Your next appointment:   6-8 week(s)  The format for your next appointment:   In Person  Provider:   You may see Kate Sable, MD or one of the following Advanced Practice Providers on your  designated Care Team:   Murray Hodgkins, NP Christell Faith, PA-C Cadence Kathlen Mody, PA-C Gerrie Nordmann, NP    Other Instructions    Important Information About Sugar         Signed, Kate Sable, MD  05/28/2022 11:13 AM    [place]

## 2022-05-28 NOTE — Patient Instructions (Signed)
Medication Instructions:   Your physician recommends that you continue on your current medications as directed. Please refer to the Current Medication list given to you today.   *If you need a refill on your cardiac medications before your next appointment, please call your pharmacy*   Testing/Procedures:  Your physician has recommended that you wear a Zio XT monitor for 2 weeks. This will be mailed to your home address in 4-5 business days.   Your clinician has requested a Zio heart rhythm monitor by [employer] to be mailed to your home for you to wear for 14 days. You should expect a small box to arrive via USPS (or FedEx in some cases) within this next week. If you do not receive it please call [employer] at [phone].  Closely watching your heart at this time will help your care team understand more and provide information needed to develop your plan of care.  Please apply your Zio patch monitor the day you receive it. Keep this packaging, you will use this to return your Zio monitor.  You will easily be able to apply the monitor with the instructions provided in the Patient Guide.  If you need assistance, [employer] representatives are available 24/7 at [phone].  You can also download the [facility] app on your phone to view detailed application instructions and log symptoms.  After you wear your monitor for 14 days, place it back in the blue box or envelope, along with your Symptom Log.  To send your monitor back: Simply use the pre-addressed and pre-paid box/envelope.  Send it back through [employer] the same day you remove it via your local post office or by placing it in your mailbox.  As soon as we receive the results, they will be reviewed and your clinician will contact you.  For the first 24 hours- it is essential to not shower or exercise, to allow the patch to adhere to your skin. Avoid excessive sweating to help maximize wear time. Do not submerge the device, no hot  tubs, and no swimming pools. Keep any lotions or oils away from the patch. After 24 hours you may shower with the patch on. Take brief showers with your back facing the shower head.  Do not remove patch once it has been placed because that will interrupt data and decrease adhesive wear time. Push the button when you have any symptoms and write down what you were feeling. Once you have completed wearing your monitor, remove and place into box which has postage paid and place in your outgoing mailbox.  If for some reason you have misplaced your box then call our office and we can provide another box and/or mail it off for you.    Follow-Up: At [facility], you and your health needs are our priority.  As part of our continuing mission to provide you with exceptional heart care, we have created designated Provider Care Teams.  These Care Teams include your primary Cardiologist (physician) and Advanced Practice Providers (APPs -  Physician Assistants and Nurse Practitioners) who all work together to provide you with the care you need, when you need it.  We recommend signing up for the patient portal called "MyChart".  Sign up information is provided on this After Visit Summary.  MyChart is used to connect with patients for Virtual Visits (Telemedicine).  Patients are able to view lab/test results, encounter notes, upcoming appointments, etc.  Non-urgent messages can be sent to your provider as well.   To  learn more about what you can do with MyChart, go to [email or web address].    Your next appointment:   6-8 week(s)  The format for your next appointment:   In Person  Provider:   You may see Kate Sable, MD or one of the following Advanced Practice Providers on your designated Care Team:   Murray Hodgkins, NP Christell Faith, PA-C Cadence Kathlen Mody, PA-C Gerrie Nordmann, NP    Other Instructions    Important Information About Sugar

## 2022-05-31 DIAGNOSIS — R001 Bradycardia, unspecified: Secondary | ICD-10-CM

## 2022-06-02 ENCOUNTER — Other Ambulatory Visit: Payer: Self-pay

## 2022-06-02 MED ORDER — LOSARTAN POTASSIUM 100 MG PO TABS: 100.0000 mg | ORAL_TABLET | Freq: Every day | ORAL | 3 refills | Status: DC

## 2022-06-19 DIAGNOSIS — R001 Bradycardia, unspecified: Secondary | ICD-10-CM | POA: Diagnosis not present

## 2022-06-21 ENCOUNTER — Other Ambulatory Visit: Payer: Self-pay | Admitting: Internal Medicine

## 2022-06-21 DIAGNOSIS — E034 Atrophy of thyroid (acquired): Secondary | ICD-10-CM

## 2022-07-16 ENCOUNTER — Encounter: Payer: Self-pay | Admitting: Cardiology

## 2022-07-16 ENCOUNTER — Ambulatory Visit: Payer: Medicare HMO | Attending: Cardiology | Admitting: Cardiology

## 2022-07-16 VITALS — BP 172/68 | HR 67 | Ht 67.0 in | Wt 146.0 lb

## 2022-07-16 DIAGNOSIS — I10 Essential (primary) hypertension: Secondary | ICD-10-CM | POA: Diagnosis not present

## 2022-07-16 DIAGNOSIS — E7800 Pure hypercholesterolemia, unspecified: Secondary | ICD-10-CM | POA: Diagnosis not present

## 2022-07-16 DIAGNOSIS — R001 Bradycardia, unspecified: Secondary | ICD-10-CM | POA: Diagnosis not present

## 2022-07-16 NOTE — Patient Instructions (Signed)
Medication Instructions:   Your physician recommends that you continue on your current medications as directed. Please refer to the Current Medication list given to you today.  *If you need a refill on your cardiac medications before your next appointment, please call your pharmacy*   Lab Work:  None ordered  If you have labs (blood work) drawn today and your tests are completely normal, you will receive your results only by: [place] (if you have MyChart) OR A paper copy in the mail If you have any lab test that is abnormal or we need to change your treatment, we will call you to review the results.   Testing/Procedures:  None Ordered    Follow-Up: At [facility], you and your health needs are our priority.  As part of our continuing mission to provide you with exceptional heart care, we have created designated Provider Care Teams.  These Care Teams include your primary Cardiologist (physician) and Advanced Practice Providers (APPs -  Physician Assistants and Nurse Practitioners) who all work together to provide you with the care you need, when you need it.  We recommend signing up for the patient portal called "MyChart".  Sign up information is provided on this After Visit Summary.  MyChart is used to connect with patients for Virtual Visits (Telemedicine).  Patients are able to view lab/test results, encounter notes, upcoming appointments, etc.  Non-urgent messages can be sent to your provider as well.   To learn more about what you can do with MyChart, go to [email or web address].    Your next appointment:  PRN   Important Information About Sugar

## 2022-07-16 NOTE — Progress Notes (Signed)
Cardiology Office Note:    Date:  07/16/2022   ID:  Alexandra Copeland, DOB 12/12/1945, MRN [id number]  PCP:  [name], MD   [place] Prov[facility] Cardiologist:  Kate Sable, MD     Referring MD: [name], MD   Chief Complaint  Patient presents with   Follow-up    Monitor follow up, no new cardiac concerns     History of Present Illness:    Alexandra Copeland is a 76 y.o. female with a hx of hypertension, hyperlipidemia, CVA, hypertension with whitecoat syndrome, who presents for follow-up.  Previously seen due to bradycardia.  Cardiac monitor was placed to evaluate any high degree AV block.  She otherwise feels well, denies any symptoms of dizziness, syncope.  She is very active, compliant with medications as prescribed.  Blood pressures at home are controlled with systolics in the 283T.  Prior notes Does not tolerate higher dose of Lipitor.  Past Medical History:  Diagnosis Date   Hyperlipidemia    Hypertension    Hypothyroid    Scarlet fever    as a child   Stroke [facility]) June 2010   left parietal hypertensive    Past Surgical History:  Procedure Laterality Date   BREAST CYST ASPIRATION Left 1978   neg   BREAST SURGERY  1975   abscess drained   CATARACT EXTRACTION W/PHACO Left 02/20/2020   Procedure: CATARACT EXTRACTION PHACO AND INTRAOCULAR LENS PLACEMENT (IOC) LEFT DIABETIC 6.77 00:41.8;  Surgeon: [name], MD;  Location: [place];  Service: Ophthalmology;  Laterality: Left;   CATARACT EXTRACTION W/PHACO Right 03/18/2020   Procedure: CATARACT EXTRACTION PHACO AND INTRAOCULAR LENS PLACEMENT (IOC) RIGHT DIABETIC 9.71  01:00.9;  Surgeon: [name], MD;  Location: [place];  Service: Ophthalmology;  Laterality: Right;   COLONOSCOPY WITH PROPOFOL N/A 01/06/2022   Procedure: COLONOSCOPY WITH PROPOFOL;  Surgeon: Jonathon Bellows, MD;  Location: [facility] ENDOSCOPY;  Service: Gastroenterology;  Laterality: N/A;   TUBAL  LIGATION      Current Medications: Current Meds  Medication Sig   aspirin 325 MG tablet Take 325 mg by mouth daily.   atorvastatin (LIPITOR) 20 MG tablet Take 1 tablet (20 mg total) by mouth daily.   levothyroxine (SYNTHROID) 88 MCG tablet TAKE 1 TABLET BY MOUTH ONCE DAILY BEFORE BREAKFAST   losartan (COZAAR) 100 MG tablet Take 1 tablet (100 mg total) by mouth daily.   magnesium chloride (SLOW-MAG) 64 MG TBEC SR tablet Take 100 mg by mouth daily.   Multiple Vitamin (MULTIVITAMIN) tablet Take 1 tablet by mouth daily.   NYSTATIN powder APPLY ONE APPLICATION TWICE DAILY TO RASH UNTIL RESOLVED   omeprazole (PRILOSEC) 20 MG capsule Take 20 mg by mouth daily.   triamcinolone cream (KENALOG) 0.1 % Apply 1 application topically 2 (two) times daily.     Allergies:   Patient has no known allergies.   Social History   Socioeconomic History   Marital status: Single    Spouse name: Not on file   Number of children: Not on file   Years of education: Not on file   Highest education level: Not on file  Occupational History   Not on file  Tobacco Use   Smoking status: Every Day    Packs/day: 1.00    Years: 26.00    Total pack years: 26.00    Types: Cigarettes    Start date: 05/10/2018   Smokeless tobacco: Never   Tobacco comments:  quit for several years.  Has recently started back.  Vaping Use   Vaping Use: Never used  Substance and Sexual Activity   Alcohol use: Not Currently    Alcohol/week: 1.0 standard drink of alcohol    Types: 1 Glasses of wine per week    Comment: OCC glass of wine.none last 24hrs   Drug use: No   Sexual activity: Never  Other Topics Concern   Not on file  Social History Narrative   Not on file   Social Determinants of Health   Financial Resource Strain: Low Risk  (03/12/2022)   Overall Financial Resource Strain (CARDIA)    Difficulty of Paying Living Expenses: Not hard at all  Food Insecurity: No Food Insecurity (03/12/2022)   Hunger Vital Sign     Worried About Running Out of Food in the Last Year: Never true    Ran Out of Food in the Last Year: Never true  Transportation Needs: No Transportation Needs (03/12/2022)   PRAPARE - Hydrologist (Medical): No    Lack of Transportation (Non-Medical): No  Physical Activity: Sufficiently Active (03/12/2022)   Exercise Vital Sign    Days of Exercise per Week: 7 days    Minutes of Exercise per Session: 30 min  Stress: No Stress Concern Present (03/12/2022)   [place]    Feeling of Stress : Not at all  Social Connections: Unknown (03/12/2022)   Social Connection and Isolation Panel [NHANES]    Frequency of Communication with Friends and Family: Not on file    Frequency of Social Gatherings with Friends and Family: More than three times a week    Attends Religious Services: Not on Advertising copywriter or Organizations: Not on file    Attends Archivist Meetings: Not on file    Marital Status: Not on file     Family History: The patient's family history includes Diabetes in her paternal grandmother; Heart disease in her mother; Hyperlipidemia in her father; Hypertension in her brother and mother; Rheum arthritis in her brother; Stroke in her father; Vision loss in her father. There is no history of Breast cancer.  ROS:   Please see the history of present illness.     All other systems reviewed and are negative.  EKGs/Labs/Other Studies Reviewed:    The following studies were reviewed today:  EKG:  EKG not ordered today.    Recent Labs: 09/19/2021: Hemoglobin 15.1; Platelets 189.0 03/26/2022: ALT 22; BUN 21; Creatinine, Ser 0.67; Potassium 3.8; Sodium 142; TSH 2.05  Recent Lipid Panel    Component Value Date/Time   CHOL 182 03/26/2022 1109   TRIG 119.0 03/26/2022 1109   HDL 47.30 03/26/2022 1109   CHOLHDL 4 03/26/2022 1109   VLDL 23.8 03/26/2022 1109   LDLCALC 111 (H)  03/26/2022 1109   LDLDIRECT 111.0 03/26/2022 1109     Risk Assessment/Calculations:     Physical Exam:    VS:  BP (!) 172/68 (BP Location: Left Arm, Patient Position: Sitting, Cuff Size: Normal)   Pulse 67   Ht '5\' 7"'$  (1.702 m)   Wt 146 lb (66.2 kg)   SpO2 98%   BMI 22.87 kg/m     Wt Readings from Last 3 Encounters:  07/16/22 146 lb (66.2 kg)  05/28/22 144 lb 12.8 oz (65.7 kg)  03/26/22 136 lb 12.8 oz (62.1 kg)     GEN:  Well nourished,  well developed in no acute distress HEENT: Normal NECK: No JVD; No carotid bruits CARDIAC: RRR, no murmurs, rubs, gallops RESPIRATORY:  Clear to auscultation without rales, wheezing or rhonchi  ABDOMEN: Soft, non-tender, non-distended MUSCULOSKELETAL:  No edema; No deformity  SKIN: Warm and dry NEUROLOGIC:  Alert and oriented x 3 PSYCHIATRIC:  Normal affect   ASSESSMENT:    1. Bradycardia   2. White coat syndrome with diagnosis of hypertension   3. Pure hypercholesterolemia    PLAN:    In order of problems listed above:  Sinus bradycardia, cardiac monitor with average heart rate 57, no evidence of high degree AV block.  No indication for additional testing, continue to monitor.  Patient asymptomatic. Hypertension, BP controlled at home.  Component of whitecoat syndrome.  Continue losartan 100 mg daily. Hyperlipidemia, history of CVA, muscle aches on higher doses of Lipitor.  Continue Lipitor 20 as she is tolerating this dose.  Follow-up as needed      Medication Adjustments/Labs and Tests Ordered: Current medicines are reviewed at length with the patient today.  Concerns regarding medicines are outlined above.  No orders of the defined types were placed in this encounter.  No orders of the defined types were placed in this encounter.   Patient Instructions  Medication Instructions:   Your physician recommends that you continue on your current medications as directed. Please refer to the Current Medication list given to you  today.  *If you need a refill on your cardiac medications before your next appointment, please call your pharmacy*   Lab Work:  None ordered  If you have labs (blood work) drawn today and your tests are completely normal, you will receive your results only by: [place] (if you have MyChart) OR A paper copy in the mail If you have any lab test that is abnormal or we need to change your treatment, we will call you to review the results.   Testing/Procedures:  None Ordered    Follow-Up: At [facility], you and your health needs are our priority.  As part of our continuing mission to provide you with exceptional heart care, we have created designated Provider Care Teams.  These Care Teams include your primary Cardiologist (physician) and Advanced Practice Providers (APPs -  Physician Assistants and Nurse Practitioners) who all work together to provide you with the care you need, when you need it.  We recommend signing up for the patient portal called "MyChart".  Sign up information is provided on this After Visit Summary.  MyChart is used to connect with patients for Virtual Visits (Telemedicine).  Patients are able to view lab/test results, encounter notes, upcoming appointments, etc.  Non-urgent messages can be sent to your provider as well.   To learn more about what you can do with MyChart, go to [email or web address].    Your next appointment:  PRN   Important Information About Sugar         Signed, Kate Sable, MD  07/16/2022 11:02 AM    [place]

## 2022-09-19 ENCOUNTER — Other Ambulatory Visit: Payer: Self-pay | Admitting: Internal Medicine

## 2022-09-19 DIAGNOSIS — E034 Atrophy of thyroid (acquired): Secondary | ICD-10-CM

## 2022-10-05 ENCOUNTER — Encounter: Payer: Self-pay | Admitting: Internal Medicine

## 2022-10-05 ENCOUNTER — Ambulatory Visit (INDEPENDENT_AMBULATORY_CARE_PROVIDER_SITE_OTHER): Payer: No Typology Code available for payment source | Admitting: Internal Medicine

## 2022-10-05 VITALS — BP 121/72 | HR 59 | Temp 97.4°F | Ht 67.0 in | Wt 146.4 lb

## 2022-10-05 DIAGNOSIS — Z1231 Encounter for screening mammogram for malignant neoplasm of breast: Secondary | ICD-10-CM | POA: Diagnosis not present

## 2022-10-05 DIAGNOSIS — Z8673 Personal history of transient ischemic attack (TIA), and cerebral infarction without residual deficits: Secondary | ICD-10-CM | POA: Diagnosis not present

## 2022-10-05 DIAGNOSIS — E785 Hyperlipidemia, unspecified: Secondary | ICD-10-CM | POA: Diagnosis not present

## 2022-10-05 DIAGNOSIS — E034 Atrophy of thyroid (acquired): Secondary | ICD-10-CM

## 2022-10-05 DIAGNOSIS — D582 Other hemoglobinopathies: Secondary | ICD-10-CM

## 2022-10-05 DIAGNOSIS — E119 Type 2 diabetes mellitus without complications: Secondary | ICD-10-CM

## 2022-10-05 DIAGNOSIS — Z716 Tobacco abuse counseling: Secondary | ICD-10-CM

## 2022-10-05 DIAGNOSIS — F321 Major depressive disorder, single episode, moderate: Secondary | ICD-10-CM | POA: Diagnosis not present

## 2022-10-05 DIAGNOSIS — F325 Major depressive disorder, single episode, in full remission: Secondary | ICD-10-CM | POA: Diagnosis not present

## 2022-10-05 DIAGNOSIS — I152 Hypertension secondary to endocrine disorders: Secondary | ICD-10-CM | POA: Diagnosis not present

## 2022-10-05 DIAGNOSIS — E1159 Type 2 diabetes mellitus with other circulatory complications: Secondary | ICD-10-CM | POA: Diagnosis not present

## 2022-10-05 DIAGNOSIS — E1169 Type 2 diabetes mellitus with other specified complication: Secondary | ICD-10-CM | POA: Diagnosis not present

## 2022-10-05 LAB — COMPREHENSIVE METABOLIC PANEL
ALT: 19 U/L (ref 0–35)
AST: 24 U/L (ref 0–37)
Albumin: 4.5 g/dL (ref 3.5–5.2)
Alkaline Phosphatase: 81 U/L (ref 39–117)
BUN: 22 mg/dL (ref 6–23)
CO2: 33 mEq/L — ABNORMAL HIGH (ref 19–32)
Calcium: 10.2 mg/dL (ref 8.4–10.5)
Chloride: 100 mEq/L (ref 96–112)
Creatinine, Ser: 0.81 mg/dL (ref 0.40–1.20)
GFR: 70.77 mL/min (ref >60.00)
Glucose, Bld: 82 mg/dL (ref 70–99)
Potassium: 3.7 mEq/L (ref 3.5–5.1)
Sodium: 142 mEq/L (ref 135–145)
Total Bilirubin: 0.7 mg/dL (ref 0.2–1.2)
Total Protein: 7.1 g/dL (ref 6.0–8.3)

## 2022-10-05 LAB — LDL CHOLESTEROL, DIRECT: Direct LDL: 91 mg/dL

## 2022-10-05 LAB — MICROALBUMIN / CREATININE URINE RATIO
Creatinine,U: 53.7 mg/dL
Microalb Creat Ratio: 2 mg/g (ref 0.0–30.0)
Microalb, Ur: 1.1 mg/dL (ref 0.0–1.9)

## 2022-10-05 LAB — LIPID PANEL
Cholesterol: 166 mg/dL (ref 0–200)
HDL: 48.4 mg/dL (ref >39.00)
LDL Cholesterol: 89 mg/dL (ref 0–99)
NonHDL: 117.21
Total CHOL/HDL Ratio: 3
Triglycerides: 143 mg/dL (ref 0.0–149.0)
VLDL: 28.6 mg/dL (ref 0.0–40.0)

## 2022-10-05 LAB — HEMOGLOBIN A1C: Hgb A1c MFr Bld: 5.8 % (ref 4.6–6.5)

## 2022-10-05 LAB — TSH: TSH: 2.28 u[IU]/mL (ref 0.35–5.50)

## 2022-10-05 LAB — MAGNESIUM: Magnesium: 2 mg/dL (ref 1.5–2.5)

## 2022-10-05 NOTE — Assessment & Plan Note (Signed)
Continue losartan 100 mg daily.  Her diabetes has been Historically well-controlled without  medications.  hemoglobin A1c has been consistently at or  less than 7.0 . Patient is up-to-date on eye exams and foot exam is normal Patient is due for urine microalbumin to creatinine ratio at next visit. Patient is tolerating statin therapy for CAD risk reduction and on ACE/ARB for reduction in proteinuria.  Current labs are pending

## 2022-10-05 NOTE — Patient Instructions (Addendum)
You still need your tetanus-diptheria-pertussis vaccine (TDaP)   Please reconsider allowing me to refer you for Lung Cancer screening   YOUR BLOOD PRESSURE should be around 130/80 or less.  Please continue to  check your blood pressure  once a week at home and send me the readings if readings start to trend up above 130/80 so I can determine if you need to increase your MEDICATION.  ALWAYS take your scheduled blood pressure medications before you come to see me

## 2022-10-05 NOTE — Assessment & Plan Note (Signed)
Continue aggressive BP management.  she reports compliance with medication regimen  but has an elevated reading today in office.  Home readings reviewed, she  is reporting readings from 120/70   to 130/80

## 2022-10-05 NOTE — Progress Notes (Signed)
Subjective:  Patient ID: Alexandra Copeland, female    DOB: 1946/02/17  Age: 77 y.o. MRN: [id number]  CC: The primary encounter diagnosis was Hypertension associated with type 2 diabetes mellitus ([place]). Diagnoses of Hyperlipidemia associated with type 2 diabetes mellitus ([place]), Hypothyroidism due to acquired atrophy of thyroid, Type 2 diabetes mellitus without complication, without long-term current use of insulin ([place]), Encounter for screening mammogram for malignant neoplasm of breast, Major depressive disorder, single episode, moderate ([place]), Elevated hemoglobin ([place]), Major depressive disorder with single episode, in full remission ([place]), Hypomagnesemia, History of CVA (cerebrovascular accident) without residual deficits, and Tobacco abuse counseling were also pertinent to this visit.   HPI Alexandra Copeland presents for  Chief Complaint  Patient presents with   Medical Management of Chronic Issues    6 month follow up    1) Type 2 DM:  she feels great . She is walking daily   Not checking blood sugars.    Denies any recent hypoglyemic events.  She is following a low carbohydrate modified diet 6 days per week.  Gained 5 lbs over Christmas but lost it again and has maintained her weight loss Denies numbness, burning and tingling of extremities. Appetite is good.    2) arrhythmia: wore the ZIO for 2 weeks and followed up with cardiology.   No conduction issues ,. Walking hr is 140,  resting heart rate ins in the 50's     3) taking mg,  electrolytes,  essential amino acids,  and 100 ounces water plus coffee,  4) short term memory concerns:  walks into a room to get something ; can't remember what for. Very proud of her granddaughter, who was on the first [place] team  that  sailed from [place] to [place] (granddaughter  is in the [employer])  5) h/o CVA:  has taken up crocheting again,  finds knitting more challenging due to multiple steps     Outpatient Medications Prior to Visit   Medication Sig Dispense Refill   aspirin 325 MG tablet Take 325 mg by mouth daily.     atorvastatin (LIPITOR) 20 MG tablet Take 1 tablet (20 mg total) by mouth daily. 90 tablet 3   levothyroxine (SYNTHROID) 88 MCG tablet TAKE 1 TABLET BY MOUTH ONCE DAILY BEFORE BREAKFAST 90 tablet 1   losartan (COZAAR) 100 MG tablet Take 1 tablet (100 mg total) by mouth daily. 90 tablet 3   magnesium chloride (SLOW-MAG) 64 MG TBEC SR tablet Take 100 mg by mouth daily.     Multiple Vitamin (MULTIVITAMIN) tablet Take 1 tablet by mouth daily.     omeprazole (PRILOSEC) 20 MG capsule Take 20 mg by mouth daily.     triamcinolone cream (KENALOG) 0.1 % Apply 1 application topically 2 (two) times daily. 80 g 0   NYSTATIN powder APPLY ONE APPLICATION TWICE DAILY TO RASH UNTIL RESOLVED (Patient not taking: Reported on 10/05/2022) 15 g 0   No facility-administered medications prior to visit.    Review of Systems;  Patient denies headache, fevers, malaise, unintentional weight loss, skin rash, eye pain, sinus congestion and sinus pain, sore throat, dysphagia,  hemoptysis , cough, dyspnea, wheezing, chest pain, palpitations, orthopnea, edema, abdominal pain, nausea, melena, diarrhea, constipation, flank pain, dysuria, hematuria, urinary  Frequency, nocturia, numbness, tingling, seizures,  Focal weakness, Loss of consciousness,  Tremor, insomnia, depression, anxiety, and suicidal ideation.      Objective:  BP 121/72   Pulse (!) 59   Temp (!) 97.4  F (36.3 C) (Oral)   Ht '5\' 7"'$  (1.702 m)   Wt 146 lb 6.4 oz (66.4 kg)   SpO2 97%   BMI 22.93 kg/m   BP Readings from Last 3 Encounters:  10/05/22 121/72  07/16/22 (!) 172/68  05/28/22 (!) 180/72    Wt Readings from Last 3 Encounters:  10/05/22 146 lb 6.4 oz (66.4 kg)  07/16/22 146 lb (66.2 kg)  05/28/22 144 lb 12.8 oz (65.7 kg)    Physical Exam Vitals reviewed.  Constitutional:      General: She is not in acute distress.    Appearance: Normal appearance. She  is normal weight. She is not ill-appearing, toxic-appearing or diaphoretic.  HENT:     Head: Normocephalic.  Eyes:     General: No scleral icterus.       Right eye: No discharge.        Left eye: No discharge.     Conjunctiva/sclera: Conjunctivae normal.  Cardiovascular:     Rate and Rhythm: Normal rate and regular rhythm.     Heart sounds: Normal heart sounds.  Pulmonary:     Effort: Pulmonary effort is normal. No respiratory distress.     Breath sounds: Normal breath sounds.  Musculoskeletal:        General: Normal range of motion.  Skin:    General: Skin is warm and dry.  Neurological:     General: No focal deficit present.     Mental Status: She is alert and oriented to person, place, and time. Mental status is at baseline.  Psychiatric:        Mood and Affect: Mood normal.        Behavior: Behavior normal.        Thought Content: Thought content normal.        Judgment: Judgment normal.     Lab Results  Component Value Date   HGBA1C 6.1 03/26/2022   HGBA1C 5.6 09/19/2021   HGBA1C 6.2 03/19/2021    Lab Results  Component Value Date   CREATININE 0.67 03/26/2022   CREATININE 0.78 09/19/2021   CREATININE 0.82 03/19/2021    Lab Results  Component Value Date   WBC 5.3 09/19/2021   HGB 15.1 (H) 09/19/2021   HCT 45.2 09/19/2021   PLT 189.0 09/19/2021   GLUCOSE 102 (H) 03/26/2022   CHOL 182 03/26/2022   TRIG 119.0 03/26/2022   HDL 47.30 03/26/2022   LDLDIRECT 111.0 03/26/2022   LDLCALC 111 (H) 03/26/2022   ALT 22 03/26/2022   AST 25 03/26/2022   NA 142 03/26/2022   K 3.8 03/26/2022   CL 102 03/26/2022   CREATININE 0.67 03/26/2022   BUN 21 03/26/2022   CO2 31 03/26/2022   TSH 2.05 03/26/2022   HGBA1C 6.1 03/26/2022   MICROALBUR <0.7 09/19/2021    No results found.  Assessment & Plan:  .Hypertension associated with type 2 diabetes mellitus (HCC) Assessment & Plan: Continue losartan 100 mg daily.  Her diabetes has been Historically well-controlled  without  medications.  hemoglobin A1c has been consistently at or  less than 7.0 . Patient is up-to-date on eye exams and foot exam is normal Patient is due for urine microalbumin to creatinine ratio at next visit. Patient is tolerating statin therapy for CAD risk reduction and on ACE/ARB for reduction in proteinuria.  Current labs are pending    Orders: -     Microalbumin / creatinine urine ratio -     Comprehensive metabolic panel  Hyperlipidemia associated with  type 2 diabetes mellitus ([place]) Assessment & Plan: Tolerating atorvastatin  goal LDL < 70 given h/o CVA   Orders: -     Lipid panel -     LDL cholesterol, direct  Hypothyroidism due to acquired atrophy of thyroid -     TSH  Type 2 diabetes mellitus without complication, without long-term current use of insulin (HCC) -     Microalbumin / creatinine urine ratio -     Comprehensive metabolic panel -     Hemoglobin A1c  Encounter for screening mammogram for malignant neoplasm of breast -     3D Screening Mammogram, Left and Right; Future  Major depressive disorder, single episode, moderate (HCC)  Elevated hemoglobin (HCC)  Major depressive disorder with single episode, in full remission ([place])  Hypomagnesemia -     Magnesium  History of CVA (cerebrovascular accident) without residual deficits Assessment & Plan: Continue aggressive BP management.  she reports compliance with medication regimen  but has an elevated reading today in office.  Home readings reviewed, she  is reporting readings from 120/70   to 130/80    Tobacco abuse counseling Assessment & Plan: She quit for several months using Chantix.  She has resumed  and plans to quit using nicoderm .  She has declined lung CA screening       I provided 30 minutes of face-to-face time during this encounter reviewing patient's last visit with me, patient's  most recent visit with cardiology,   recent surgical and non surgical procedures, previous  labs and imaging  studies, counseling on currently addressed issues,  and post visit ordering to diagnostics and therapeutics .   Follow-up: Return in about 6 months (around 04/05/2023) for follow up diabetes.   [name], MD

## 2022-10-05 NOTE — Assessment & Plan Note (Signed)
Tolerating atorvastatin  goal LDL < 70 given h/o CVA

## 2022-10-05 NOTE — Assessment & Plan Note (Signed)
She quit for several months using Chantix.  She has resumed  and plans to quit using nicoderm .  She has declined lung CA screening

## 2022-11-29 DIAGNOSIS — R112 Nausea with vomiting, unspecified: Secondary | ICD-10-CM | POA: Diagnosis not present

## 2022-11-29 DIAGNOSIS — Z8673 Personal history of transient ischemic attack (TIA), and cerebral infarction without residual deficits: Secondary | ICD-10-CM | POA: Diagnosis not present

## 2022-11-29 DIAGNOSIS — R29700 NIHSS score 0: Secondary | ICD-10-CM | POA: Diagnosis not present

## 2022-11-29 DIAGNOSIS — E785 Hyperlipidemia, unspecified: Secondary | ICD-10-CM | POA: Diagnosis not present

## 2022-11-29 DIAGNOSIS — R2681 Unsteadiness on feet: Secondary | ICD-10-CM | POA: Diagnosis not present

## 2022-11-29 DIAGNOSIS — E039 Hypothyroidism, unspecified: Secondary | ICD-10-CM | POA: Diagnosis not present

## 2022-11-29 DIAGNOSIS — Z79890 Hormone replacement therapy (postmenopausal): Secondary | ICD-10-CM | POA: Diagnosis not present

## 2022-11-29 DIAGNOSIS — I69398 Other sequelae of cerebral infarction: Secondary | ICD-10-CM | POA: Diagnosis not present

## 2022-11-29 DIAGNOSIS — K2100 Gastro-esophageal reflux disease with esophagitis, without bleeding: Secondary | ICD-10-CM | POA: Diagnosis not present

## 2022-11-29 DIAGNOSIS — R079 Chest pain, unspecified: Secondary | ICD-10-CM | POA: Diagnosis not present

## 2022-11-29 DIAGNOSIS — R001 Bradycardia, unspecified: Secondary | ICD-10-CM | POA: Diagnosis not present

## 2022-11-29 DIAGNOSIS — I10 Essential (primary) hypertension: Secondary | ICD-10-CM | POA: Diagnosis not present

## 2022-11-29 DIAGNOSIS — Z7982 Long term (current) use of aspirin: Secondary | ICD-10-CM | POA: Diagnosis not present

## 2022-11-29 DIAGNOSIS — R42 Dizziness and giddiness: Secondary | ICD-10-CM | POA: Diagnosis not present

## 2022-11-29 DIAGNOSIS — I6523 Occlusion and stenosis of bilateral carotid arteries: Secondary | ICD-10-CM | POA: Diagnosis not present

## 2022-11-29 DIAGNOSIS — E119 Type 2 diabetes mellitus without complications: Secondary | ICD-10-CM | POA: Diagnosis not present

## 2022-11-29 DIAGNOSIS — R29818 Other symptoms and signs involving the nervous system: Secondary | ICD-10-CM | POA: Diagnosis not present

## 2022-11-29 DIAGNOSIS — Z79899 Other long term (current) drug therapy: Secondary | ICD-10-CM | POA: Diagnosis not present

## 2022-11-29 DIAGNOSIS — F17210 Nicotine dependence, cigarettes, uncomplicated: Secondary | ICD-10-CM | POA: Diagnosis not present

## 2022-11-30 DIAGNOSIS — E785 Hyperlipidemia, unspecified: Secondary | ICD-10-CM | POA: Diagnosis not present

## 2022-11-30 DIAGNOSIS — E039 Hypothyroidism, unspecified: Secondary | ICD-10-CM | POA: Diagnosis not present

## 2022-11-30 DIAGNOSIS — Z8673 Personal history of transient ischemic attack (TIA), and cerebral infarction without residual deficits: Secondary | ICD-10-CM | POA: Diagnosis not present

## 2022-11-30 DIAGNOSIS — R001 Bradycardia, unspecified: Secondary | ICD-10-CM | POA: Diagnosis not present

## 2022-11-30 DIAGNOSIS — K2100 Gastro-esophageal reflux disease with esophagitis, without bleeding: Secondary | ICD-10-CM | POA: Insufficient documentation

## 2022-11-30 DIAGNOSIS — I10 Essential (primary) hypertension: Secondary | ICD-10-CM | POA: Diagnosis not present

## 2022-11-30 DIAGNOSIS — I6389 Other cerebral infarction: Secondary | ICD-10-CM | POA: Diagnosis not present

## 2022-11-30 DIAGNOSIS — R42 Dizziness and giddiness: Secondary | ICD-10-CM | POA: Diagnosis not present

## 2022-11-30 HISTORY — DX: Bradycardia, unspecified: R00.1

## 2022-12-11 ENCOUNTER — Telehealth: Payer: Self-pay

## 2022-12-11 ENCOUNTER — Encounter: Payer: Self-pay | Admitting: Internal Medicine

## 2022-12-11 ENCOUNTER — Ambulatory Visit: Payer: No Typology Code available for payment source | Admitting: Internal Medicine

## 2022-12-11 VITALS — BP 166/80 | HR 70 | Temp 98.1°F | Ht 67.0 in | Wt 135.2 lb

## 2022-12-11 DIAGNOSIS — R42 Dizziness and giddiness: Secondary | ICD-10-CM | POA: Diagnosis not present

## 2022-12-11 DIAGNOSIS — E1159 Type 2 diabetes mellitus with other circulatory complications: Secondary | ICD-10-CM

## 2022-12-11 DIAGNOSIS — K2970 Gastritis, unspecified, without bleeding: Secondary | ICD-10-CM | POA: Diagnosis not present

## 2022-12-11 DIAGNOSIS — I152 Hypertension secondary to endocrine disorders: Secondary | ICD-10-CM | POA: Diagnosis not present

## 2022-12-11 DIAGNOSIS — Z09 Encounter for follow-up examination after completed treatment for conditions other than malignant neoplasm: Secondary | ICD-10-CM

## 2022-12-11 DIAGNOSIS — R1013 Epigastric pain: Secondary | ICD-10-CM

## 2022-12-11 DIAGNOSIS — D751 Secondary polycythemia: Secondary | ICD-10-CM | POA: Diagnosis not present

## 2022-12-11 DIAGNOSIS — D582 Other hemoglobinopathies: Secondary | ICD-10-CM

## 2022-12-11 LAB — COMPREHENSIVE METABOLIC PANEL
ALT: 15 U/L (ref 0–35)
AST: 23 U/L (ref 0–37)
Albumin: 4.4 g/dL (ref 3.5–5.2)
Alkaline Phosphatase: 74 U/L (ref 39–117)
BUN: 18 mg/dL (ref 6–23)
CO2: 29 mEq/L (ref 19–32)
Calcium: 9.6 mg/dL (ref 8.4–10.5)
Chloride: 98 mEq/L (ref 96–112)
Creatinine, Ser: 0.75 mg/dL (ref 0.40–1.20)
GFR: 77.52 mL/min (ref >60.00)
Glucose, Bld: 154 mg/dL — ABNORMAL HIGH (ref 70–99)
Potassium: 3.5 mEq/L (ref 3.5–5.1)
Sodium: 138 mEq/L (ref 135–145)
Total Bilirubin: 0.7 mg/dL (ref 0.2–1.2)
Total Protein: 6.8 g/dL (ref 6.0–8.3)

## 2022-12-11 LAB — CBC WITH DIFFERENTIAL/PLATELET
Basophils Absolute: 0.1 10*3/uL (ref 0.0–0.1)
Basophils Relative: 0.9 % (ref 0.0–3.0)
Eosinophils Absolute: 0.1 10*3/uL (ref 0.0–0.7)
Eosinophils Relative: 1.7 % (ref 0.0–5.0)
HCT: 46.1 % — ABNORMAL HIGH (ref 36.0–46.0)
Hemoglobin: 15.8 g/dL — ABNORMAL HIGH (ref 12.0–15.0)
Lymphocytes Relative: 23.3 % (ref 12.0–46.0)
Lymphs Abs: 1.7 10*3/uL (ref 0.7–4.0)
MCHC: 34.3 g/dL (ref 30.0–36.0)
MCV: 95.9 fl (ref 78.0–100.0)
Monocytes Absolute: 0.8 10*3/uL (ref 0.1–1.0)
Monocytes Relative: 10 % (ref 3.0–12.0)
Neutro Abs: 4.8 10*3/uL (ref 1.4–7.7)
Neutrophils Relative %: 64.1 % (ref 43.0–77.0)
Platelets: 235 10*3/uL (ref 150.0–400.0)
RBC: 4.81 Mil/uL (ref 3.87–5.11)
RDW: 13.3 % (ref 11.5–15.5)
WBC: 7.5 10*3/uL (ref 4.0–10.5)

## 2022-12-11 LAB — POC HEMOCCULT BLD/STL (OFFICE/1-CARD/DIAGNOSTIC): Fecal Occult Blood, POC: NEGATIVE

## 2022-12-11 LAB — H. PYLORI ANTIBODY, IGG: H Pylori IgG: NEGATIVE

## 2022-12-11 LAB — SEDIMENTATION RATE: Sed Rate: 7 mm/hr (ref 0–30)

## 2022-12-11 LAB — C-REACTIVE PROTEIN: CRP: <1 mg/dL (ref 0.5–20.0)

## 2022-12-11 MED ORDER — CHANTIX STARTING MONTH PAK 0.5 MG X 11 & 1 MG X 42 PO TBPK: ORAL_TABLET | ORAL | 0 refills | Status: DC

## 2022-12-11 MED ORDER — PREDNISONE 10 MG PO TABS: ORAL_TABLET | ORAL | 0 refills | Status: DC

## 2022-12-11 MED ORDER — DIAZEPAM 5 MG PO TABS: 5.0000 mg | ORAL_TABLET | Freq: Two times a day (BID) | ORAL | 0 refills | Status: DC | PRN

## 2022-12-11 MED ORDER — FLUTICASONE PROPIONATE 50 MCG/ACT NA SUSP: 2.0000 | Freq: Every day | NASAL | 6 refills | Status: DC

## 2022-12-11 NOTE — Telephone Encounter (Signed)
Pt is aware.  

## 2022-12-11 NOTE — Telephone Encounter (Signed)
Pt was at checkout and stated that chantix was supposed to be sent in to pharmacy.

## 2022-12-11 NOTE — Patient Instructions (Addendum)
Omeprazole needs to be taken on an empty stomach   to be absorbed  Start the entire dose ,  40 mg in the morning   take it 30 minutes prior to eating   Change thyroid medication to bedtime (also empty stomach) .  Can take it  with lipitor   Take losartan with omeprazole .  We are adding amlodipine 2.5 mg daily for the blood pressure take at night     For the vertigo:  steroid nasal spray AND prednisone taper .  Valium for the nausea/anxiety

## 2022-12-11 NOTE — Progress Notes (Unsigned)
Subjective:  Patient ID: Alexandra Copeland, female    DOB: 01-21-1946  Age: 77 y.o. MRN: [id number]  CC: There were no encounter diagnoses.   HPI ELLODY WELTZIN presents for  Chief Complaint  Patient presents with   Hospitalization Follow-up    77 yr old female with h/o CVA, HTN, HLD and hypothyroid admitted to [facility] ;on April 21 with CODE STROKE PROTOCOL WHLE IN [facility] WITH DAUGHTER   for  sudden onset vertigo with nausea and  emesis.    Had worked in the yard the day prior and did not wear a mask,  had a lot of sinus congestion  and sneezing .  Symptoms occurred in the setting of increased  emotional stress from several family emergencies   Symptoms of  gastritis started on Easter Sunday after eating dinner.  First  episode of pain and vomiting started then,  since then she has eliminated her caffeine and  changed her diet ,  bland  diet.  But still having epigastic pain  .  For the last 2 weeks has been taking omeprazole 20 mg bid after eating  stools have been  brown   MRI Brain: left mastoid effusion, Left ICA stenosis 65% ECHO: WNL  DISCHARGED ON 4/22 WITH ASA, 325  STATIN (all previous)  prilosec dose increase to 40 mg for gastritis.  BP was elevated and is still elevated   STILL NAUSEATED on a daily basis .  Still having vertigo with sudden position changes to standing .  Not present in the  morning.  Wakes up feeling very hungr, gnawing feeling in stomach    Elevated bp on losartan and hct  Outpatient Medications Prior to Visit  Medication Sig Dispense Refill   aspirin 325 MG tablet Take 325 mg by mouth daily.     atorvastatin (LIPITOR) 20 MG tablet Take 1 tablet (20 mg total) by mouth daily. 90 tablet 3   levothyroxine (SYNTHROID) 88 MCG tablet TAKE 1 TABLET BY MOUTH ONCE DAILY BEFORE BREAKFAST 90 tablet 1   losartan (COZAAR) 100 MG tablet Take 1 tablet (100 mg total) by mouth daily. 90 tablet 3   magnesium chloride (SLOW-MAG) 64 MG TBEC SR tablet Take 100 mg by  mouth daily.     Multiple Vitamin (MULTIVITAMIN) tablet Take 1 tablet by mouth daily.     omeprazole (PRILOSEC OTC) 20 MG tablet Take 2 tablets by mouth daily.     triamcinolone cream (KENALOG) 0.1 % Apply 1 application topically 2 (two) times daily. 80 g 0   omeprazole (PRILOSEC) 20 MG capsule Take 20 mg by mouth daily. (Patient not taking: Reported on 12/11/2022)     No facility-administered medications prior to visit.    Review of Systems;  Patient denies headache, fevers, malaise, unintentional weight loss, skin rash, eye pain, sinus congestion and sinus pain, sore throat, dysphagia,  hemoptysis , cough, dyspnea, wheezing, chest pain, palpitations, orthopnea, edema, abdominal pain, nausea, melena, diarrhea, constipation, flank pain, dysuria, hematuria, urinary  Frequency, nocturia, numbness, tingling, seizures,  Focal weakness, Loss of consciousness,  Tremor, insomnia, depression, anxiety, and suicidal ideation.      Objective:  BP (!) 166/80   Pulse 70   Temp 98.1 F (36.7 C) (Oral)   Ht 5\' 7"  (1.702 m)   Wt 135 lb 3.2 oz (61.3 kg)   SpO2 97%   BMI 21.18 kg/m   BP Readings from Last 3 Encounters:  12/11/22 (!) 166/80  10/05/22 121/72  07/16/22 Marland Kitchen)  172/68    Wt Readings from Last 3 Encounters:  12/11/22 135 lb 3.2 oz (61.3 kg)  10/05/22 146 lb 6.4 oz (66.4 kg)  07/16/22 146 lb (66.2 kg)    Physical Exam  Lab Results  Component Value Date   HGBA1C 5.8 10/05/2022   HGBA1C 6.1 03/26/2022   HGBA1C 5.6 09/19/2021    Lab Results  Component Value Date   CREATININE 0.81 10/05/2022   CREATININE 0.67 03/26/2022   CREATININE 0.78 09/19/2021    Lab Results  Component Value Date   WBC 5.3 09/19/2021   HGB 15.1 (H) 09/19/2021   HCT 45.2 09/19/2021   PLT 189.0 09/19/2021   GLUCOSE 82 10/05/2022   CHOL 166 10/05/2022   TRIG 143.0 10/05/2022   HDL 48.40 10/05/2022   LDLDIRECT 91.0 10/05/2022   LDLCALC 89 10/05/2022   ALT 19 10/05/2022   AST 24 10/05/2022   NA 142  10/05/2022   K 3.7 10/05/2022   CL 100 10/05/2022   CREATININE 0.81 10/05/2022   BUN 22 10/05/2022   CO2 33 (H) 10/05/2022   TSH 2.28 10/05/2022   HGBA1C 5.8 10/05/2022   MICROALBUR 1.1 10/05/2022    No results found.  Assessment & Plan:  .There are no diagnoses linked to this encounter.   I provided 30 minutes of face-to-face time during this encounter reviewing patient's last visit with me, patient's  most recent visit with cardiology,  nephrology,  and neurology,  recent surgical and non surgical procedures, previous  labs and imaging studies, counseling on currently addressed issues,  and post visit ordering to diagnostics and therapeutics .   Follow-up: No follow-ups on file.   Sherlene Shams, MD

## 2022-12-13 DIAGNOSIS — Z09 Encounter for follow-up examination after completed treatment for conditions other than malignant neoplasm: Secondary | ICD-10-CM | POA: Insufficient documentation

## 2022-12-13 DIAGNOSIS — K2970 Gastritis, unspecified, without bleeding: Secondary | ICD-10-CM | POA: Insufficient documentation

## 2022-12-13 DIAGNOSIS — R42 Dizziness and giddiness: Secondary | ICD-10-CM | POA: Insufficient documentation

## 2022-12-13 MED ORDER — AMLODIPINE BESYLATE 2.5 MG PO TABS
2.5000 mg | ORAL_TABLET | Freq: Every day | ORAL | 1 refills | Status: DC
Start: 2022-12-13 — End: 2023-05-21

## 2022-12-13 NOTE — Assessment & Plan Note (Signed)
ADDING AMLODIPINE 2.5 MG DAILY TO CURRENT REGIMEN OF LOSARTAN AND HCTZ

## 2022-12-13 NOTE — Assessment & Plan Note (Signed)
Secondary polycythemia due to smoking

## 2022-12-13 NOTE — Assessment & Plan Note (Signed)
She has no evidence of stroke by recent workup. . Given her sinus congestion  will treate with prednisone  and valium

## 2022-12-13 NOTE — Assessment & Plan Note (Signed)
Patient is stable post discharge and has no new issues or questions about discharge plans at the visit today for hospital follow up. All labs , imaging studies and progress notes from admission were reviewed with patient today   

## 2022-12-13 NOTE — Assessment & Plan Note (Signed)
GUAIAC NEGATIVE ON EXAM TODAY.  SHE HAS NOT BEEN TAKING OMEPRAZOLE ON AN EMPTY STOMACH.   ADMINISTRATION CORRECTED

## 2022-12-26 ENCOUNTER — Telehealth: Payer: No Typology Code available for payment source | Admitting: Nurse Practitioner

## 2022-12-26 DIAGNOSIS — K2970 Gastritis, unspecified, without bleeding: Secondary | ICD-10-CM

## 2022-12-26 MED ORDER — ONDANSETRON HCL 4 MG PO TABS: 4.0000 mg | ORAL_TABLET | Freq: Three times a day (TID) | ORAL | 0 refills | Status: DC | PRN

## 2022-12-26 NOTE — Progress Notes (Signed)
E-Visit for Vomiting  We are sorry that you are not feeling well. Here is how we plan to help!  Based on what you have shared with me it looks like you have a Virus that is irritating your GI tract.  Vomiting is the forceful emptying of a portion of the stomach's content through the mouth.  Although nausea and vomiting can make you feel miserable, it's important to remember that these are not diseases, but rather symptoms of an underlying illness.  When we treat short term symptoms, we always caution that any symptoms that persist should be fully evaluated in a medical office.  I have prescribed a medication that will help alleviate your symptoms and allow you to stay hydrated:  Zofran 4 mg 1 tablet every 8 hours as needed for nausea and vomiting  HOME CARE: Drink clear liquids.  This is very important! Dehydration (the lack of fluid) can lead to a serious complication.  Start off with 1 tablespoon every 5 minutes for 8 hours. You may begin eating bland foods after 8 hours without vomiting.  Start with saltine crackers, white bread, rice, mashed potatoes, applesauce. After 48 hours on a bland diet, you may resume a normal diet. Try to go to sleep.  Sleep often empties the stomach and relieves the need to vomit.  GET HELP RIGHT AWAY IF:  Your symptoms do not improve or worsen within 2 days after treatment. You have a fever for over 3 days. You cannot keep down fluids after trying the medication.  MAKE SURE YOU:  Understand these instructions. Will watch your condition. Will get help right away if you are not doing well or get worse.   Thank you for choosing an e-visit.  Your e-visit answers were reviewed by a board certified advanced clinical practitioner to complete your personal care plan. Depending upon the condition, your plan could have included both over the counter or prescription medications.  Please review your pharmacy choice. Make sure the pharmacy is open so you can pick  up prescription now. If there is a problem, you may contact your provider through [employer] and have the prescription routed to another pharmacy.  Your safety is important to [place]. If you have drug allergies check your prescription carefully.   For the next 24 hours you can use MyChart to ask questions about today's visit, request a non-urgent call back, or ask for a work or school excuse. You will get an email in the next two days asking about your experience. I hope that your e-visit has been valuable and will speed your recovery.  Alexandra Daphine Deutscher, FNP   5-10 minutes spent reviewing and documenting in chart. Zofran

## 2022-12-28 ENCOUNTER — Telehealth: Payer: Self-pay | Admitting: Internal Medicine

## 2022-12-28 NOTE — Telephone Encounter (Signed)
Spoke with pt and scheduled her for an appt tomorrow morning with Bethanie Dicker, NP.

## 2022-12-28 NOTE — Telephone Encounter (Signed)
Pt called in staying that she would like to speak to Dr.Tullo. as per pt, she's having nauseas, vomiting, she's not eating, and feels very weak. She would like a phone call back on what to do.

## 2022-12-29 ENCOUNTER — Emergency Department
Admission: EM | Admit: 2022-12-29 | Discharge: 2022-12-29 | Disposition: A | Discharge status: Home / Self Care | Payer: No Typology Code available for payment source | Attending: Emergency Medicine | Admitting: Emergency Medicine

## 2022-12-29 ENCOUNTER — Other Ambulatory Visit: Payer: Self-pay

## 2022-12-29 ENCOUNTER — Emergency Department: Payer: No Typology Code available for payment source

## 2022-12-29 ENCOUNTER — Encounter: Payer: Self-pay | Admitting: Intensive Care

## 2022-12-29 ENCOUNTER — Encounter: Payer: Self-pay | Admitting: Nurse Practitioner

## 2022-12-29 ENCOUNTER — Ambulatory Visit (INDEPENDENT_AMBULATORY_CARE_PROVIDER_SITE_OTHER): Payer: No Typology Code available for payment source | Admitting: Nurse Practitioner

## 2022-12-29 VITALS — BP 100/64 | HR 69 | Temp 98.2°F | Ht 67.0 in | Wt 130.0 lb

## 2022-12-29 DIAGNOSIS — K8020 Calculus of gallbladder without cholecystitis without obstruction: Secondary | ICD-10-CM | POA: Diagnosis not present

## 2022-12-29 DIAGNOSIS — Z8673 Personal history of transient ischemic attack (TIA), and cerebral infarction without residual deficits: Secondary | ICD-10-CM | POA: Insufficient documentation

## 2022-12-29 DIAGNOSIS — R270 Ataxia, unspecified: Secondary | ICD-10-CM | POA: Diagnosis not present

## 2022-12-29 DIAGNOSIS — N281 Cyst of kidney, acquired: Secondary | ICD-10-CM | POA: Diagnosis not present

## 2022-12-29 DIAGNOSIS — R112 Nausea with vomiting, unspecified: Secondary | ICD-10-CM | POA: Diagnosis not present

## 2022-12-29 DIAGNOSIS — I10 Essential (primary) hypertension: Secondary | ICD-10-CM | POA: Insufficient documentation

## 2022-12-29 DIAGNOSIS — R1010 Upper abdominal pain, unspecified: Secondary | ICD-10-CM | POA: Diagnosis present

## 2022-12-29 DIAGNOSIS — R079 Chest pain, unspecified: Secondary | ICD-10-CM | POA: Diagnosis not present

## 2022-12-29 DIAGNOSIS — E039 Hypothyroidism, unspecified: Secondary | ICD-10-CM | POA: Insufficient documentation

## 2022-12-29 DIAGNOSIS — R634 Abnormal weight loss: Secondary | ICD-10-CM | POA: Diagnosis not present

## 2022-12-29 DIAGNOSIS — S0990XA Unspecified injury of head, initial encounter: Secondary | ICD-10-CM | POA: Diagnosis not present

## 2022-12-29 DIAGNOSIS — R1084 Generalized abdominal pain: Secondary | ICD-10-CM | POA: Diagnosis not present

## 2022-12-29 DIAGNOSIS — F17200 Nicotine dependence, unspecified, uncomplicated: Secondary | ICD-10-CM | POA: Insufficient documentation

## 2022-12-29 LAB — CBC WITH DIFFERENTIAL/PLATELET
Abs Immature Granulocytes: 0.03 10*3/uL (ref 0.00–0.07)
Basophils Absolute: 0.1 10*3/uL (ref 0.0–0.1)
Basophils Relative: 1 %
Eosinophils Absolute: 0.1 10*3/uL (ref 0.0–0.5)
Eosinophils Relative: 1 %
HCT: 47.6 % — ABNORMAL HIGH (ref 36.0–46.0)
Hemoglobin: 16.2 g/dL — ABNORMAL HIGH (ref 12.0–15.0)
Immature Granulocytes: 0 %
Lymphocytes Relative: 16 %
Lymphs Abs: 1.4 10*3/uL (ref 0.7–4.0)
MCH: 32.6 pg (ref 26.0–34.0)
MCHC: 34 g/dL (ref 30.0–36.0)
MCV: 95.8 fL (ref 80.0–100.0)
Monocytes Absolute: 0.9 10*3/uL (ref 0.1–1.0)
Monocytes Relative: 9 %
Neutro Abs: 6.7 10*3/uL (ref 1.7–7.7)
Neutrophils Relative %: 73 %
Platelets: 241 10*3/uL (ref 150–400)
RBC: 4.97 MIL/uL (ref 3.87–5.11)
RDW: 12.2 % (ref 11.5–15.5)
WBC: 9.2 10*3/uL (ref 4.0–10.5)
nRBC: 0 % (ref 0.0–0.2)

## 2022-12-29 LAB — TROPONIN I (HIGH SENSITIVITY)
Troponin I (High Sensitivity): 20 ng/L — ABNORMAL HIGH (ref <18)
Troponin I (High Sensitivity): 24 ng/L — ABNORMAL HIGH (ref <18)

## 2022-12-29 LAB — COMPREHENSIVE METABOLIC PANEL
ALT: 17 U/L (ref 0–44)
AST: 26 U/L (ref 15–41)
Albumin: 4.5 g/dL (ref 3.5–5.0)
Alkaline Phosphatase: 66 U/L (ref 38–126)
Anion gap: 10 (ref 5–15)
BUN: 17 mg/dL (ref 8–23)
CO2: 28 mmol/L (ref 22–32)
Calcium: 9.4 mg/dL (ref 8.9–10.3)
Chloride: 98 mmol/L (ref 98–111)
Creatinine, Ser: 0.79 mg/dL (ref 0.44–1.00)
GFR, Estimated: >60 mL/min (ref >60)
Glucose, Bld: 139 mg/dL — ABNORMAL HIGH (ref 70–99)
Potassium: 3 mmol/L — ABNORMAL LOW (ref 3.5–5.1)
Sodium: 136 mmol/L (ref 135–145)
Total Bilirubin: 1.3 mg/dL — ABNORMAL HIGH (ref 0.3–1.2)
Total Protein: 7.3 g/dL (ref 6.5–8.1)

## 2022-12-29 LAB — PROTIME-INR
INR: 1.1 (ref 0.8–1.2)
Prothrombin Time: 14.5 seconds (ref 11.4–15.2)

## 2022-12-29 LAB — LIPASE, BLOOD: Lipase: 27 U/L (ref 11–51)

## 2022-12-29 LAB — APTT: aPTT: 27 seconds (ref 24–36)

## 2022-12-29 MED ORDER — SODIUM CHLORIDE 0.9 % IV BOLUS
500.0000 mL | Freq: Once | INTRAVENOUS | Status: AC
  Administered 2022-12-29: 500 mL via INTRAVENOUS

## 2022-12-29 MED ORDER — ONDANSETRON HCL 4 MG/2ML IJ SOLN
4.0000 mg | Freq: Once | INTRAMUSCULAR | Status: AC
  Administered 2022-12-29: 4 mg via INTRAVENOUS
  Filled 2022-12-29: qty 2

## 2022-12-29 MED ORDER — IOHEXOL 300 MG/ML  SOLN
100.0000 mL | Freq: Once | INTRAMUSCULAR | Status: AC | PRN
  Administered 2022-12-29: 100 mL via INTRAVENOUS

## 2022-12-29 MED ORDER — PANTOPRAZOLE SODIUM 40 MG IV SOLR
40.0000 mg | Freq: Once | INTRAVENOUS | Status: AC
  Administered 2022-12-29: 40 mg via INTRAVENOUS
  Filled 2022-12-29: qty 10

## 2022-12-29 NOTE — Assessment & Plan Note (Addendum)
Worsening symptoms for over one month. She continues to be unable to eat, only small sips of soup and water. Generalized weakness and weight loss. Advised need for imaging and labs. Patient's daughter would like to take her to the ED to be evaluated and for additional work up. Agreed with patient and family to seek care in local ED. Will make PCP aware.

## 2022-12-29 NOTE — Progress Notes (Signed)
Bethanie Dicker, NP-C Phone: [phone]  Alexandra Copeland is a 77 y.o. female who presents today for nausea and vomiting.  She is present with her daughter. Patient reports constant nausea with occasional vomiting since the beginning of April. She was evaluated in the ED for stroke work up on 11/29/2022, all of which was negative. She was seen in the office on 12/11/2022 for gastritis. She has changed how she was taking her Omeprazole. Hemoccult and H. Pylori testing was negative. She was recently seen via E-visit on 12/26/2022 for continuing problems with nausea and vomiting. She was prescribed Zofran. She took 2 doses of Zofran yesterday and did not vomit. She has not taken any today. She reports a constant "gnawing" like feeling in her stomach before and after eating. She is only able to take and keep down small sips of water and soup. She reports feeling like she is constipated. She has been having regular bowel movements. She has lost approximately 10 pounds since she was seen in the ED in April. She reports generalized weakness. Denies fevers.   Social History   Tobacco Use  Smoking Status Every Day   Packs/day: 1.00   Years: 26.00   Additional pack years: 0.00   Total pack years: 26.00   Types: Cigarettes   Start date: 05/10/2018  Smokeless Tobacco Never  Tobacco Comments   quit for several years.  Has recently started back.    Current Outpatient Medications on File Prior to Visit  Medication Sig Dispense Refill   amLODipine (NORVASC) 2.5 MG tablet Take 1 tablet (2.5 mg total) by mouth daily. 90 tablet 1   aspirin 325 MG tablet Take 325 mg by mouth daily.     atorvastatin (LIPITOR) 20 MG tablet Take 1 tablet (20 mg total) by mouth daily. 90 tablet 3   CHANTIX STARTING MONTH PAK 0.5 MG X 11 & 1 MG X 42 TBPK Take as directed 53 each 0   diazepam (VALIUM) 5 MG tablet Take 1 tablet (5 mg total) by mouth every 12 (twelve) hours as needed for anxiety (vertigo). 20 tablet 0   fluticasone  (FLONASE) 50 MCG/ACT nasal spray Place 2 sprays into both nostrils daily. 16 g 6   levothyroxine (SYNTHROID) 88 MCG tablet TAKE 1 TABLET BY MOUTH ONCE DAILY BEFORE BREAKFAST 90 tablet 1   losartan (COZAAR) 100 MG tablet Take 1 tablet (100 mg total) by mouth daily. 90 tablet 3   magnesium chloride (SLOW-MAG) 64 MG TBEC SR tablet Take 100 mg by mouth daily.     Multiple Vitamin (MULTIVITAMIN) tablet Take 1 tablet by mouth daily.     omeprazole (PRILOSEC OTC) 20 MG tablet Take 2 tablets by mouth daily.     ondansetron (ZOFRAN) 4 MG tablet Take 1 tablet (4 mg total) by mouth every 8 (eight) hours as needed for nausea or vomiting. 20 tablet 0   triamcinolone cream (KENALOG) 0.1 % Apply 1 application topically 2 (two) times daily. 80 g 0   No current facility-administered medications on file prior to visit.    ROS see history of present illness  Objective  Physical Exam Vitals:   12/29/22 1114  BP: 100/64  Pulse: 69  Temp: 98.2 F (36.8 C)  SpO2: 97%    BP Readings from Last 3 Encounters:  12/29/22 100/64  12/11/22 (!) 166/80  10/05/22 121/72   Wt Readings from Last 3 Encounters:  12/29/22 130 lb (59 kg)  12/11/22 135 lb 3.2 oz (61.3 kg)  10/05/22 146  lb 6.4 oz (66.4 kg)    Physical Exam Constitutional:      General: She is not in acute distress.    Appearance: Normal appearance.  HENT:     Head: Normocephalic.  Cardiovascular:     Rate and Rhythm: Normal rate and regular rhythm.     Heart sounds: Normal heart sounds.  Pulmonary:     Effort: Pulmonary effort is normal.     Breath sounds: Normal breath sounds.  Abdominal:     General: Abdomen is flat. Bowel sounds are normal.     Palpations: Abdomen is soft.     Tenderness: There is no abdominal tenderness.  Skin:    General: Skin is warm and dry.  Neurological:     General: No focal deficit present.     Mental Status: She is alert.  Psychiatric:        Mood and Affect: Mood normal.        Behavior: Behavior  normal.    Assessment/Plan: Please see individual problem list.  Nausea and vomiting, unspecified vomiting type Assessment & Plan: Worsening symptoms for over one month. She continues to be unable to eat, only small sips of soup and water. Generalized weakness and weight loss. Advised need for imaging and labs. Patient's daughter would like to take her to the ED to be evaluated and for additional work up. Agreed with patient and family to seek care in local ED. Will make PCP aware.     No follow-ups on file.   Bethanie Dicker, NP-C [facility] - [employer]

## 2022-12-29 NOTE — Discharge Instructions (Signed)
You were seen in the emergency department for abdominal pain and nausea.  You had a CT scan done that showed findings concerning for peripheral arterial disease that could be causing your abdominal symptoms.  Your information was provided to vascular surgery and they will be working on scheduling a follow-up appointment in their clinic for further imaging  You had findings concerning for constipation on your CT scan.  Start MiraLAX.  Stay hydrated and drink plenty of fluids.  Constipation management - take MiraLAX (2 capfulls) mixed with 32-64 ounces of fluid.  Drink this entire drink.  If you do not have a bowel movement that day you can repeat the next day and add 2 capfuls for a total of 4 capfuls of MiraLAX.  MiraLAX does not work if you do not drink plenty of fluids with it. Once you have a good bowel movement, take 1 capfull of Miralax daily until having regular bowel movements.

## 2022-12-29 NOTE — ED Provider Notes (Signed)
[facility] Provider Note    Event Date/Time   First MD Initiated Contact with Patient 12/29/22 1514     (approximate)   History   Abdominal Pain   HPI  Alexandra Copeland is a 77 y.o. female past medical history significant for history of CVA, hypertension, hyperlipidemia, hypothyroidism, presents to the emergency department with abdominal pain and nausea.  Patient endorses multiple weeks of ongoing nausea and upper abdominal pain.  Describes a gnawing dull sensation.  Endorses early satiety and feeling full quickly.  Poor appetite over the past couple of days with 15 pounds of weight loss over the past couple of weeks.  Denies any fever or chills.  No prior abdominal surgeries.  Recently on steroids for vertigo and felt like her stomach symptoms improved on steroids.  Symptoms of nausea and upper abdominal pain occurred before starting steroids.  Recently seen a primary care physician office.  Recent colonoscopy last year.  No recent endoscopy.     Physical Exam   Triage Vital Signs: ED Triage Vitals  Enc Vitals Group     BP 12/29/22 1341 (!) 160/85     Pulse Rate 12/29/22 1341 69     Resp 12/29/22 1341 16     Temp 12/29/22 1341 98.1 F (36.7 C)     Temp Source 12/29/22 1341 Oral     SpO2 12/29/22 1341 97 %     Weight 12/29/22 1343 127 lb 13.9 oz (58 kg)     Height 12/29/22 1343 5\' 7"  (1.702 m)     Head Circumference --      Peak Flow --      Pain Score 12/29/22 1343 5     Pain Loc --      Pain Edu? --      Excl. in GC? --     Most recent vital signs: Vitals:   12/29/22 1341 12/29/22 1828  BP: (!) 160/85 120/63  Pulse: 69 (!) 59  Resp: 16 16  Temp: 98.1 F (36.7 C) 98.3 F (36.8 C)  SpO2: 97% 97%    Physical Exam Constitutional:      Appearance: She is well-developed.  HENT:     Head: Atraumatic.  Eyes:     Conjunctiva/sclera: Conjunctivae normal.  Cardiovascular:     Rate and Rhythm: Regular rhythm.  Pulmonary:     Effort: No  respiratory distress.  Abdominal:     General: There is no distension.     Tenderness: There is abdominal tenderness in the epigastric area. There is no right CVA tenderness, left CVA tenderness, guarding or rebound.  Musculoskeletal:        General: Normal range of motion.     Cervical back: Normal range of motion.  Skin:    General: Skin is warm.  Neurological:     Mental Status: She is alert. Mental status is at baseline.     IMPRESSION / MDM / ASSESSMENT AND PLAN / ED COURSE  I reviewed the triage vital signs and the nursing notes.  Differential diagnosis including gastritis/PUD, malignancy, intra-abdominal abscess, dehydration, electrolyte abnormality  On chart review patient had a recent TSH that was checked, low suspicion for hypothyroidism.  H. pylori testing was negative.  EKG  I, Corena Herter, the attending physician, personally viewed and interpreted this ECG.   Rate: Normal  Rhythm: Normal sinus  Axis: Normal  Intervals: Normal  ST&T Change: None  No tachycardic or bradycardic dysrhythmias while on cardiac telemetry.  RADIOLOGY  I independently reviewed imaging, my interpretation of imaging: CT abdomen and pelvis with contrast no findings of acute appendicitis.  Read as signs of constipation.  Dilation of appendix but no surrounding stranding or signs of acute appendicitis.  Signs of peripheral arterial disease with areas of stenosis.  LABS (all labs ordered are listed, but only abnormal results are displayed) Labs interpreted as -    Labs Reviewed  COMPREHENSIVE METABOLIC PANEL - Abnormal; Notable for the following components:      Result Value   Potassium 3.0 (*)    Glucose, Bld 139 (*)    Total Bilirubin 1.3 (*)    All other components within normal limits  CBC WITH DIFFERENTIAL/PLATELET - Abnormal; Notable for the following components:   Hemoglobin 16.2 (*)    HCT 47.6 (*)    All other components within normal limits  TROPONIN I (HIGH SENSITIVITY)  - Abnormal; Notable for the following components:   Troponin I (High Sensitivity) 20 (*)    All other components within normal limits  TROPONIN I (HIGH SENSITIVITY) - Abnormal; Notable for the following components:   Troponin I (High Sensitivity) 24 (*)    All other components within normal limits  LIPASE, BLOOD  PROTIME-INR  APTT  URINALYSIS, ROUTINE W REFLEX MICROSCOPIC     MDM  Patient's lab work with polycythemia that has been chronic in nature from her tobacco use.  No significant leukocytosis.  Troponin mildly elevated at 20.  Glucose within normal limits.  Normal LFTs.  Normal lipase.  Potassium mildly low at 3.0.  Treated with IV fluids, IV antiemetics with Zofran and IV Protonix  CT scan with a dilated appendix however patient with no findings concerning for an acute appendicitis.  No right lower quadrant abdominal tenderness to palpation.  Pain is well-controlled at this time and no pain out of proportion.  Low suspicion for an acute mesenteric ischemia, possible chronic mesenteric ischemia given nicotine use, weight loss and chronic nature of her symptoms.  Consulted and discussed with vascular surgery who will arrange for outpatient follow-up and further imaging.  Discussed bowel cleanout with the patient for her constipation.  Discussed smoking cessation.  Given return precautions for any worsening symptoms.  Discussed continuing on with her nausea medication with Zofran and Prilosec.     PROCEDURES:  Critical Care performed: No  Procedures  Patient's presentation is most consistent with acute presentation with potential threat to life or bodily function.   MEDICATIONS ORDERED IN ED: Medications  sodium chloride 0.9 % bolus 500 mL (0 mLs Intravenous Stopped 12/29/22 1800)  ondansetron (ZOFRAN) injection 4 mg (4 mg Intravenous Given 12/29/22 1634)  pantoprazole (PROTONIX) injection 40 mg (40 mg Intravenous Given 12/29/22 1636)  iohexol (OMNIPAQUE) 300 MG/ML solution 100  mL (100 mLs Intravenous Contrast Given 12/29/22 1708)    FINAL CLINICAL IMPRESSION(S) / ED DIAGNOSES   Final diagnoses:  Generalized abdominal pain     Rx / DC Orders   ED Discharge Orders     None        Note:  This document was prepared using Dragon voice recognition software and may include unintentional dictation errors.   Corena Herter, MD 12/29/22 1945

## 2022-12-29 NOTE — ED Triage Notes (Addendum)
C/o abdominal discomfort with N/V X1 month. Patient mentioned her HR is normally 30-40 but recently in the 70s. Reports hot flashes.   Reports she has lost 15lb in the past month.  Patient reports feeling weak all over but when ambulating, leaning to the left. Bilateral strong grips. Face symmetrical

## 2022-12-30 NOTE — Progress Notes (Unsigned)
Bethanie Dicker, NP-C Phone: [phone]  Alexandra Copeland is a 77 y.o. female who presents today for ED follow up.   Patient was seen in the ED on 12/29/2022 for generalized abdominal pain. ED note, CT scan and lab work reviewed. She was told she was constipated and there was a concern for possible chronic mesenteric ischemia. She is scheduled to see Vascular regarding this next week for additional work up. In her CT of the Abdomen and Pelvis she was found to have a left adrenal nodule measuring 16 mm that was suspicious for an adenoma. Radiology recommended a non-contrast MRI for further evaluation.   She took one dose of Miralax yesterday and has been having bowel movements. She reports feeling better today than she did 2 days ago. She has been taking her Zofran as directed and has had a decrease in nausea. Her appetite has started to increase. She is no longer vomiting.   Social History   Tobacco Use  Smoking Status Every Day   Packs/day: 1.00   Years: 26.00   Additional pack years: 0.00   Total pack years: 26.00   Types: Cigarettes   Start date: 05/10/2018  Smokeless Tobacco Never  Tobacco Comments   quit for several years.  Has recently started back.    Current Outpatient Medications on File Prior to Visit  Medication Sig Dispense Refill   amLODipine (NORVASC) 2.5 MG tablet Take 1 tablet (2.5 mg total) by mouth daily. 90 tablet 1   aspirin 325 MG tablet Take 325 mg by mouth daily.     atorvastatin (LIPITOR) 20 MG tablet Take 1 tablet (20 mg total) by mouth daily. 90 tablet 3   CHANTIX STARTING MONTH PAK 0.5 MG X 11 & 1 MG X 42 TBPK Take as directed 53 each 0   diazepam (VALIUM) 5 MG tablet Take 1 tablet (5 mg total) by mouth every 12 (twelve) hours as needed for anxiety (vertigo). 20 tablet 0   fluticasone (FLONASE) 50 MCG/ACT nasal spray Place 2 sprays into both nostrils daily. 16 g 6   levothyroxine (SYNTHROID) 88 MCG tablet TAKE 1 TABLET BY MOUTH ONCE DAILY BEFORE BREAKFAST 90  tablet 1   losartan (COZAAR) 100 MG tablet Take 1 tablet (100 mg total) by mouth daily. 90 tablet 3   magnesium chloride (SLOW-MAG) 64 MG TBEC SR tablet Take 100 mg by mouth daily.     Multiple Vitamin (MULTIVITAMIN) tablet Take 1 tablet by mouth daily.     omeprazole (PRILOSEC OTC) 20 MG tablet Take 2 tablets by mouth daily.     ondansetron (ZOFRAN) 4 MG tablet Take 1 tablet (4 mg total) by mouth every 8 (eight) hours as needed for nausea or vomiting. 20 tablet 0   triamcinolone cream (KENALOG) 0.1 % Apply 1 application topically 2 (two) times daily. 80 g 0   No current facility-administered medications on file prior to visit.     ROS see history of present illness  Objective  Physical Exam Vitals:   12/31/22 0823  BP: 104/60  Pulse: (!) 59  Temp: 97.9 F (36.6 C)  SpO2: 98%    BP Readings from Last 3 Encounters:  12/31/22 104/60  12/29/22 120/63  12/29/22 100/64   Wt Readings from Last 3 Encounters:  12/31/22 131 lb 9.6 oz (59.7 kg)  12/29/22 127 lb 13.9 oz (58 kg)  12/29/22 130 lb (59 kg)    Physical Exam Constitutional:      General: She is not in acute distress.  Appearance: Normal appearance.  HENT:     Head: Normocephalic.  Cardiovascular:     Rate and Rhythm: Normal rate and regular rhythm.     Heart sounds: Normal heart sounds.  Pulmonary:     Effort: Pulmonary effort is normal.     Breath sounds: Normal breath sounds.  Abdominal:     General: Abdomen is flat. Bowel sounds are normal.     Palpations: Abdomen is soft.     Tenderness: There is no abdominal tenderness.  Skin:    General: Skin is warm and dry.  Neurological:     General: No focal deficit present.     Mental Status: She is alert.  Psychiatric:        Mood and Affect: Mood normal.        Behavior: Behavior normal.    Assessment/Plan: Please see individual problem list.  Adrenal nodule (HCC) Assessment & Plan: Noted on CT of Abd and Pelvis on 12/29/2022 for abdominal pain,  results: Left adrenal nodule. Not definitive for an adenoma on this examination but suspicious for one. Please correlate with any prior dedicated confirmatory study when clinically appropriate such as non-contrast MRI or washout postcontrast CT. Will proceed with non-contrast MRI of Abdomen for further evaluation. Order placed.   Orders: -     MR ABDOMEN WO CONTRAST; Future  Constipation, unspecified constipation type Assessment & Plan: She will continue Miralax daily until she continues to have regular bowel movements. She can decrease to every other day if bowel movements become too loose or diarrhea. Encouraged adequate water intake. Advised bland diet, she will increase as tolerated. She can continue her Zofran as needed. She will follow up with Vascular next week for further evaluation and her PCP, Dr. Darrick Huntsman, on June 3rd as previously scheduled. Return precautions given to patient.     Return in 11 days (on 01/11/2023) for follow up with Dr. Darrick Huntsman as previously scheduled.   Bethanie Dicker, NP-C [facility] - [employer]

## 2022-12-31 ENCOUNTER — Other Ambulatory Visit (INDEPENDENT_AMBULATORY_CARE_PROVIDER_SITE_OTHER): Payer: Self-pay | Admitting: Vascular Surgery

## 2022-12-31 ENCOUNTER — Ambulatory Visit (INDEPENDENT_AMBULATORY_CARE_PROVIDER_SITE_OTHER): Payer: No Typology Code available for payment source | Admitting: Nurse Practitioner

## 2022-12-31 ENCOUNTER — Encounter: Payer: Self-pay | Admitting: Nurse Practitioner

## 2022-12-31 VITALS — BP 104/60 | HR 59 | Temp 97.9°F | Ht 67.0 in | Wt 131.6 lb

## 2022-12-31 DIAGNOSIS — E278 Other specified disorders of adrenal gland: Secondary | ICD-10-CM | POA: Insufficient documentation

## 2022-12-31 DIAGNOSIS — K5900 Constipation, unspecified: Secondary | ICD-10-CM | POA: Diagnosis not present

## 2022-12-31 DIAGNOSIS — R109 Unspecified abdominal pain: Secondary | ICD-10-CM

## 2022-12-31 DIAGNOSIS — E279 Disorder of adrenal gland, unspecified: Secondary | ICD-10-CM | POA: Insufficient documentation

## 2022-12-31 DIAGNOSIS — I7090 Unspecified atherosclerosis: Secondary | ICD-10-CM

## 2022-12-31 HISTORY — DX: Constipation, unspecified: K59.00

## 2022-12-31 NOTE — Assessment & Plan Note (Addendum)
Noted on CT of Abd and Pelvis on 12/29/2022 for abdominal pain, results: Left adrenal nodule. Not definitive for an adenoma on this examination but suspicious for one. Please correlate with any prior dedicated confirmatory study when clinically appropriate such as non-contrast MRI or washout postcontrast CT. Will proceed with non-contrast MRI of Abdomen for further evaluation. Order placed.

## 2022-12-31 NOTE — Assessment & Plan Note (Signed)
She will continue Miralax daily until she continues to have regular bowel movements. She can decrease to every other day if bowel movements become too loose or diarrhea. Encouraged adequate water intake. Advised bland diet, she will increase as tolerated. She can continue her Zofran as needed. She will follow up with Vascular next week for further evaluation and her PCP, Dr. Darrick Huntsman, on June 3rd as previously scheduled. Return precautions given to patient.

## 2023-01-05 ENCOUNTER — Telehealth: Payer: Self-pay | Admitting: Internal Medicine

## 2023-01-05 NOTE — Telephone Encounter (Signed)
Lft pt vm to call ofc regarding MRI. thanks 

## 2023-01-06 ENCOUNTER — Ambulatory Visit (INDEPENDENT_AMBULATORY_CARE_PROVIDER_SITE_OTHER): Payer: No Typology Code available for payment source

## 2023-01-06 ENCOUNTER — Ambulatory Visit (INDEPENDENT_AMBULATORY_CARE_PROVIDER_SITE_OTHER): Payer: No Typology Code available for payment source | Admitting: Nurse Practitioner

## 2023-01-06 ENCOUNTER — Encounter (INDEPENDENT_AMBULATORY_CARE_PROVIDER_SITE_OTHER): Payer: Self-pay | Admitting: Nurse Practitioner

## 2023-01-06 VITALS — BP 151/75 | HR 57 | Resp 16 | Wt 135.6 lb

## 2023-01-06 DIAGNOSIS — R109 Unspecified abdominal pain: Secondary | ICD-10-CM

## 2023-01-06 DIAGNOSIS — E1159 Type 2 diabetes mellitus with other circulatory complications: Secondary | ICD-10-CM

## 2023-01-06 DIAGNOSIS — I7090 Unspecified atherosclerosis: Secondary | ICD-10-CM

## 2023-01-06 DIAGNOSIS — I6523 Occlusion and stenosis of bilateral carotid arteries: Secondary | ICD-10-CM | POA: Diagnosis not present

## 2023-01-06 DIAGNOSIS — K551 Chronic vascular disorders of intestine: Secondary | ICD-10-CM | POA: Diagnosis not present

## 2023-01-06 DIAGNOSIS — I152 Hypertension secondary to endocrine disorders: Secondary | ICD-10-CM | POA: Diagnosis not present

## 2023-01-06 NOTE — H&P (View-Only) (Signed)
 Subjective:    Patient ID: Alexandra Copeland, female    DOB: 08/15/1945, 77 y.o.   MRN: [id number] Chief Complaint  Patient presents with   New Patient (Initial Visit)    Consult for abd pain and nausea, with calcifications and areas of stenosis    Alexandra Copeland is a 77-year-old female that we are asked to evaluate the patient for the complaint of abdominal pain with uncertain etiology.  The patient notes that her symptoms have been ongoing since about Easter.  That day she began to have vomiting and some associated abdominal pain.  Shortly thereafter on 1 April she began to suffer with dizziness, nausea and vomiting as well.  She also had some elevated blood pressure at the time.  During her workup at [facility] she had a CTA done which showed 50% right ICA stenosis and 65% left ICA stenosis.  She continued to have abdominal pain and has begun to describe a sort of food phobia and has begun to eat less in order to try to avoid the abdominal pain as well as the nausea and vomiting.  She is lost approximately 10 to 15 pounds over the last several weeks.  She does not have any bloody bowel movements or diarrhea but has been constipated.  No prior peripheral angiograms or vascular interventions.  The patient denies amaurosis fugax or recent TIA symptoms. There are no recent neurological changes noted. The patient denies claudication symptoms or rest pain symptoms. The patient denies history of DVT, PE or superficial thrombophlebitis. The patient denies recent episodes of angina    Today noninvasive studies show 70 to 99% stenosis in the superior mesenteric artery.  The largest aortic diameter was 2.1 cm.  The velocities throughout the superior mesenteric artery are significantly elevated with the worst being near the midportion.        Review of Systems  Cardiovascular:  Positive for leg swelling.  Gastrointestinal:  Positive for abdominal pain, constipation, nausea and vomiting.   Neurological:  Positive for dizziness.  All other systems reviewed and are negative.      Objective:   Physical Exam Vitals reviewed.  HENT:     Head: Normocephalic.  Cardiovascular:     Rate and Rhythm: Normal rate.     Pulses:          Dorsalis pedis pulses are 2+ on the right side and 1+ on the left side.       Posterior tibial pulses are 1+ on the right side and 1+ on the left side.  Pulmonary:     Effort: Pulmonary effort is normal.  Skin:    General: Skin is warm and dry.  Neurological:     Mental Status: She is alert and oriented to person, place, and time.  Psychiatric:        Mood and Affect: Mood normal.        Behavior: Behavior normal.        Thought Content: Thought content normal.        Judgment: Judgment normal.     BP (!) 151/75 (BP Location: Right Arm)   Pulse (!) 57   Resp 16   Wt 135 lb 9.6 oz (61.5 kg)   BMI 21.24 kg/m   Past Medical History:  Diagnosis Date   Hyperlipidemia    Hypertension    Hypothyroid    Scarlet fever    as a child   Stroke (HCC) June 2010   left parietal hypertensive      Social History   Socioeconomic History   Marital status: Single    Spouse name: Not on file   Number of children: Not on file   Years of education: Not on file   Highest education level: Bachelor's degree (e.g., BA, AB, BS)  Occupational History   Not on file  Tobacco Use   Smoking status: Every Day    Packs/day: 1.00    Years: 26.00    Additional pack years: 0.00    Total pack years: 26.00    Types: Cigarettes    Start date: 05/10/2018   Smokeless tobacco: Never   Tobacco comments:    quit for several years.  Has recently started back.  Vaping Use   Vaping Use: Never used  Substance and Sexual Activity   Alcohol use: Not Currently    Alcohol/week: 1.0 standard drink of alcohol    Types: 1 Glasses of wine per week    Comment: OCC glass of wine.none last 24hrs   Drug use: Yes    Comment: one valium a day   Sexual activity: Never   Other Topics Concern   Not on file  Social History Narrative   Not on file   Social Determinants of Health   Financial Resource Strain: Low Risk  (12/08/2022)   Overall Financial Resource Strain (CARDIA)    Difficulty of Paying Living Expenses: Not very hard  Food Insecurity: No Food Insecurity (12/08/2022)   Hunger Vital Sign    Worried About Running Out of Food in the Last Year: Never true    Ran Out of Food in the Last Year: Never true  Transportation Needs: No Transportation Needs (12/08/2022)   PRAPARE - Transportation    Lack of Transportation (Medical): No    Lack of Transportation (Non-Medical): No  Physical Activity: Sufficiently Active (12/08/2022)   Exercise Vital Sign    Days of Exercise per Week: 7 days    Minutes of Exercise per Session: 40 min  Stress: No Stress Concern Present (12/08/2022)   [employer] - Occupational Stress Questionnaire    Feeling of Stress : Only a little  Social Connections: Moderately Isolated (12/08/2022)   Social Connection and Isolation Panel [NHANES]    Frequency of Communication with Friends and Family: Three times a week    Frequency of Social Gatherings with Friends and Family: Once a week    Attends Religious Services: 1 to 4 times per year    Active Member of Clubs or Organizations: No    Attends Club or Organization Meetings: Not on file    Marital Status: Divorced  Intimate Partner Violence: Not At Risk (03/12/2022)   Humiliation, Afraid, Rape, and Kick questionnaire    Fear of Current or Ex-Partner: No    Emotionally Abused: No    Physically Abused: No    Sexually Abused: No    Past Surgical History:  Procedure Laterality Date   BREAST CYST ASPIRATION Left 1978   neg   BREAST SURGERY  1975   abscess drained   CATARACT EXTRACTION W/PHACO Left 02/20/2020   Procedure: CATARACT EXTRACTION PHACO AND INTRAOCULAR LENS PLACEMENT (IOC) LEFT DIABETIC 6.77 00:41.8;  Surgeon: King, Bradley Mark, MD;  Location:  [facility];  Service: Ophthalmology;  Laterality: Left;   CATARACT EXTRACTION W/PHACO Right 03/18/2020   Procedure: CATARACT EXTRACTION PHACO AND INTRAOCULAR LENS PLACEMENT (IOC) RIGHT DIABETIC 9.71  01:00.9;  Surgeon: King, Bradley Mark, MD;  Location: [facility];  Service: Ophthalmology;  Laterality: Right;     COLONOSCOPY WITH PROPOFOL N/A 01/06/2022   Procedure: COLONOSCOPY WITH PROPOFOL;  Surgeon: Anna, Kiran, MD;  Location: [facility];  Service: Gastroenterology;  Laterality: N/A;   TUBAL LIGATION      Family History  Problem Relation Age of Onset   Heart disease Mother    Hypertension Mother    Stroke Father    Hyperlipidemia Father    Vision loss Father    Hypertension Brother    Rheum arthritis Brother    Diabetes Paternal Grandmother    Breast cancer Neg Hx     No Known Allergies     Latest Ref Rng & Units 12/29/2022    1:51 PM 12/11/2022   11:55 AM 09/19/2021   11:18 AM  CBC  WBC 4.0 - 10.5 K/uL 9.2  7.5  5.3   Hemoglobin 12.0 - 15.0 g/dL 16.2  15.8  15.1   Hematocrit 36.0 - 46.0 % 47.6  46.1  45.2   Platelets 150 - 400 K/uL 241  235.0  189.0       CMP     Component Value Date/Time   NA 136 12/29/2022 1351   K 3.0 (L) 12/29/2022 1351   CL 98 12/29/2022 1351   CO2 28 12/29/2022 1351   GLUCOSE 139 (H) 12/29/2022 1351   BUN 17 12/29/2022 1351   CREATININE 0.79 12/29/2022 1351   CALCIUM 9.4 12/29/2022 1351   PROT 7.3 12/29/2022 1351   ALBUMIN 4.5 12/29/2022 1351   AST 26 12/29/2022 1351   ALT 17 12/29/2022 1351   ALKPHOS 66 12/29/2022 1351   BILITOT 1.3 (H) 12/29/2022 1351   GFRNONAA >60 12/29/2022 1351     No results found.     Assessment & Plan:   1. Mesenteric artery stenosis (HCC) Recommend:  The patient has evidence of severe atherosclerotic changes of the mesenteric arteries associated with weight loss as well as abdominal pain and N/V.  This represents a high risk for bowel infarction and death.  Patient should undergo  angiography of the mesenteric arteries with the hope for intervention to eliminate the ischemic symptoms.    The risks and benefits as well as the alternative therapies was discussed in detail with the patient.  All questions were answered.  Patient agrees to proceed with angiography and intervention.  The patient will follow up with me after the angiogram.  2. Bilateral carotid artery stenosis In the setting of the patient's dizziness she had a CTA done at [facility] and it was noted to have about a 50% right ICA stenosis with 65% left ICA stenosis.  I had a discussion about the progression of carotid disease and typically when the patient is asymptomatic we plan repair at about 75%.  Based on her current studies we will plan to reevaluate in 6 months which will be about October or so.    3. Hypertension associated with type 2 diabetes mellitus (HCC) Continue antihypertensive medications as already ordered, these medications have been reviewed and there are no changes at this time.    Current Outpatient Medications on File Prior to Visit  Medication Sig Dispense Refill   amLODipine (NORVASC) 2.5 MG tablet Take 1 tablet (2.5 mg total) by mouth daily. 90 tablet 1   aspirin 325 MG tablet Take 325 mg by mouth daily.     atorvastatin (LIPITOR) 20 MG tablet Take 1 tablet (20 mg total) by mouth daily. 90 tablet 3   diazepam (VALIUM) 5 MG tablet Take 1 tablet (5 mg total) by mouth every   12 (twelve) hours as needed for anxiety (vertigo). 20 tablet 0   fluticasone (FLONASE) 50 MCG/ACT nasal spray Place 2 sprays into both nostrils daily. 16 g 6   levothyroxine (SYNTHROID) 88 MCG tablet TAKE 1 TABLET BY MOUTH ONCE DAILY BEFORE BREAKFAST 90 tablet 1   losartan (COZAAR) 100 MG tablet Take 1 tablet (100 mg total) by mouth daily. 90 tablet 3   magnesium chloride (SLOW-MAG) 64 MG TBEC SR tablet Take 100 mg by mouth daily.     Multiple Vitamin (MULTIVITAMIN) tablet Take 1 tablet by mouth daily.     omeprazole  (PRILOSEC OTC) 20 MG tablet Take 2 tablets by mouth daily.     ondansetron (ZOFRAN) 4 MG tablet Take 1 tablet (4 mg total) by mouth every 8 (eight) hours as needed for nausea or vomiting. 20 tablet 0   triamcinolone cream (KENALOG) 0.1 % Apply 1 application topically 2 (two) times daily. 80 g 0   CHANTIX STARTING MONTH PAK 0.5 MG X 11 & 1 MG X 42 TBPK Take as directed (Patient not taking: Reported on 01/06/2023) 53 each 0   No current facility-administered medications on file prior to visit.    There are no Patient Instructions on file for this visit. No follow-ups on file.   Garlon Tuggle E Robin Pafford, NP   

## 2023-01-06 NOTE — Progress Notes (Signed)
Subjective:    Patient ID: Alexandra Copeland, female    DOB: Aug 12, 1945, 77 y.o.   MRN: [id number] Chief Complaint  Patient presents with   New Patient (Initial Visit)    Consult for abd pain and nausea, with calcifications and areas of stenosis    Syniyah Schuring is a 77 year old female that we are asked to evaluate the patient for the complaint of abdominal pain with uncertain etiology.  The patient notes that her symptoms have been ongoing since about Easter.  That day she began to have vomiting and some associated abdominal pain.  Shortly thereafter on 1 April she began to suffer with dizziness, nausea and vomiting as well.  She also had some elevated blood pressure at the time.  During her workup at [facility] she had a CTA done which showed 50% right ICA stenosis and 65% left ICA stenosis.  She continued to have abdominal pain and has begun to describe a sort of food phobia and has begun to eat less in order to try to avoid the abdominal pain as well as the nausea and vomiting.  She is lost approximately 10 to 15 pounds over the last several weeks.  She does not have any bloody bowel movements or diarrhea but has been constipated.  No prior peripheral angiograms or vascular interventions.  The patient denies amaurosis fugax or recent TIA symptoms. There are no recent neurological changes noted. The patient denies claudication symptoms or rest pain symptoms. The patient denies history of DVT, PE or superficial thrombophlebitis. The patient denies recent episodes of angina    Today noninvasive studies show 70 to 99% stenosis in the superior mesenteric artery.  The largest aortic diameter was 2.1 cm.  The velocities throughout the superior mesenteric artery are significantly elevated with the worst being near the midportion.        Review of Systems  Cardiovascular:  Positive for leg swelling.  Gastrointestinal:  Positive for abdominal pain, constipation, nausea and vomiting.   Neurological:  Positive for dizziness.  All other systems reviewed and are negative.      Objective:   Physical Exam Vitals reviewed.  HENT:     Head: Normocephalic.  Cardiovascular:     Rate and Rhythm: Normal rate.     Pulses:          Dorsalis pedis pulses are 2+ on the right side and 1+ on the left side.       Posterior tibial pulses are 1+ on the right side and 1+ on the left side.  Pulmonary:     Effort: Pulmonary effort is normal.  Skin:    General: Skin is warm and dry.  Neurological:     Mental Status: She is alert and oriented to person, place, and time.  Psychiatric:        Mood and Affect: Mood normal.        Behavior: Behavior normal.        Thought Content: Thought content normal.        Judgment: Judgment normal.     BP (!) 151/75 (BP Location: Right Arm)   Pulse (!) 57   Resp 16   Wt 135 lb 9.6 oz (61.5 kg)   BMI 21.24 kg/m   Past Medical History:  Diagnosis Date   Hyperlipidemia    Hypertension    Hypothyroid    Scarlet fever    as a child   Stroke [facility]) June 2010   left parietal hypertensive  Social History   Socioeconomic History   Marital status: Single    Spouse name: Not on file   Number of children: Not on file   Years of education: Not on file   Highest education level: Bachelor's degree (e.g., BA, AB, BS)  Occupational History   Not on file  Tobacco Use   Smoking status: Every Day    Packs/day: 1.00    Years: 26.00    Additional pack years: 0.00    Total pack years: 26.00    Types: Cigarettes    Start date: 05/10/2018   Smokeless tobacco: Never   Tobacco comments:    quit for several years.  Has recently started back.  Vaping Use   Vaping Use: Never used  Substance and Sexual Activity   Alcohol use: Not Currently    Alcohol/week: 1.0 standard drink of alcohol    Types: 1 Glasses of wine per week    Comment: OCC glass of wine.none last 24hrs   Drug use: Yes    Comment: one valium a day   Sexual activity: Never   Other Topics Concern   Not on file  Social History Narrative   Not on file   Social Determinants of Health   Financial Resource Strain: Low Risk  (12/08/2022)   Overall Financial Resource Strain (CARDIA)    Difficulty of Paying Living Expenses: Not very hard  Food Insecurity: No Food Insecurity (12/08/2022)   Hunger Vital Sign    Worried About Running Out of Food in the Last Year: Never true    Ran Out of Food in the Last Year: Never true  Transportation Needs: No Transportation Needs (12/08/2022)   PRAPARE - Administrator, Civil Service (Medical): No    Lack of Transportation (Non-Medical): No  Physical Activity: Sufficiently Active (12/08/2022)   Exercise Vital Sign    Days of Exercise per Week: 7 days    Minutes of Exercise per Session: 40 min  Stress: No Stress Concern Present (12/08/2022)   [employer] of Occupational Health - Occupational Stress Questionnaire    Feeling of Stress : Only a little  Social Connections: Moderately Isolated (12/08/2022)   Social Connection and Isolation Panel [NHANES]    Frequency of Communication with Friends and Family: Three times a week    Frequency of Social Gatherings with Friends and Family: Once a week    Attends Religious Services: 1 to 4 times per year    Active Member of [employer] or Organizations: No    Attends Engineer, structural: Not on file    Marital Status: Divorced  Intimate Partner Violence: Not At Risk (03/12/2022)   Humiliation, Afraid, Rape, and Kick questionnaire    Fear of Current or Ex-Partner: No    Emotionally Abused: No    Physically Abused: No    Sexually Abused: No    Past Surgical History:  Procedure Laterality Date   BREAST CYST ASPIRATION Left 1978   neg   BREAST SURGERY  1975   abscess drained   CATARACT EXTRACTION W/PHACO Left 02/20/2020   Procedure: CATARACT EXTRACTION PHACO AND INTRAOCULAR LENS PLACEMENT (IOC) LEFT DIABETIC 6.77 00:41.8;  Surgeon: Nevada Crane, MD;  Location:  [facility];  Service: Ophthalmology;  Laterality: Left;   CATARACT EXTRACTION W/PHACO Right 03/18/2020   Procedure: CATARACT EXTRACTION PHACO AND INTRAOCULAR LENS PLACEMENT (IOC) RIGHT DIABETIC 9.71  01:00.9;  Surgeon: Nevada Crane, MD;  Location: [facility];  Service: Ophthalmology;  Laterality: Right;  COLONOSCOPY WITH PROPOFOL N/A 01/06/2022   Procedure: COLONOSCOPY WITH PROPOFOL;  Surgeon: Wyline Mood, MD;  Location: [facility];  Service: Gastroenterology;  Laterality: N/A;   TUBAL LIGATION      Family History  Problem Relation Age of Onset   Heart disease Mother    Hypertension Mother    Stroke Father    Hyperlipidemia Father    Vision loss Father    Hypertension Brother    Rheum arthritis Brother    Diabetes Paternal Grandmother    Breast cancer Neg Hx     No Known Allergies     Latest Ref Rng & Units 12/29/2022    1:51 PM 12/11/2022   11:55 AM 09/19/2021   11:18 AM  CBC  WBC 4.0 - 10.5 K/uL 9.2  7.5  5.3   Hemoglobin 12.0 - 15.0 g/dL 16.1  09.6  04.5   Hematocrit 36.0 - 46.0 % 47.6  46.1  45.2   Platelets 150 - 400 K/uL 241  235.0  189.0       CMP     Component Value Date/Time   NA 136 12/29/2022 1351   K 3.0 (L) 12/29/2022 1351   CL 98 12/29/2022 1351   CO2 28 12/29/2022 1351   GLUCOSE 139 (H) 12/29/2022 1351   BUN 17 12/29/2022 1351   CREATININE 0.79 12/29/2022 1351   CALCIUM 9.4 12/29/2022 1351   PROT 7.3 12/29/2022 1351   ALBUMIN 4.5 12/29/2022 1351   AST 26 12/29/2022 1351   ALT 17 12/29/2022 1351   ALKPHOS 66 12/29/2022 1351   BILITOT 1.3 (H) 12/29/2022 1351   GFRNONAA >60 12/29/2022 1351     No results found.     Assessment & Plan:   1. Mesenteric artery stenosis (HCC) Recommend:  The patient has evidence of severe atherosclerotic changes of the mesenteric arteries associated with weight loss as well as abdominal pain and N/V.  This represents a high risk for bowel infarction and death.  Patient should undergo  angiography of the mesenteric arteries with the hope for intervention to eliminate the ischemic symptoms.    The risks and benefits as well as the alternative therapies was discussed in detail with the patient.  All questions were answered.  Patient agrees to proceed with angiography and intervention.  The patient will follow up with me after the angiogram.  2. Bilateral carotid artery stenosis In the setting of the patient's dizziness she had a CTA done at [facility] and it was noted to have about a 50% right ICA stenosis with 65% left ICA stenosis.  I had a discussion about the progression of carotid disease and typically when the patient is asymptomatic we plan repair at about 75%.  Based on her current studies we will plan to reevaluate in 6 months which will be about October or so.    3. Hypertension associated with type 2 diabetes mellitus (HCC) Continue antihypertensive medications as already ordered, these medications have been reviewed and there are no changes at this time.    Current Outpatient Medications on File Prior to Visit  Medication Sig Dispense Refill   amLODipine (NORVASC) 2.5 MG tablet Take 1 tablet (2.5 mg total) by mouth daily. 90 tablet 1   aspirin 325 MG tablet Take 325 mg by mouth daily.     atorvastatin (LIPITOR) 20 MG tablet Take 1 tablet (20 mg total) by mouth daily. 90 tablet 3   diazepam (VALIUM) 5 MG tablet Take 1 tablet (5 mg total) by mouth every  12 (twelve) hours as needed for anxiety (vertigo). 20 tablet 0   fluticasone (FLONASE) 50 MCG/ACT nasal spray Place 2 sprays into both nostrils daily. 16 g 6   levothyroxine (SYNTHROID) 88 MCG tablet TAKE 1 TABLET BY MOUTH ONCE DAILY BEFORE BREAKFAST 90 tablet 1   losartan (COZAAR) 100 MG tablet Take 1 tablet (100 mg total) by mouth daily. 90 tablet 3   magnesium chloride (SLOW-MAG) 64 MG TBEC SR tablet Take 100 mg by mouth daily.     Multiple Vitamin (MULTIVITAMIN) tablet Take 1 tablet by mouth daily.     omeprazole  (PRILOSEC OTC) 20 MG tablet Take 2 tablets by mouth daily.     ondansetron (ZOFRAN) 4 MG tablet Take 1 tablet (4 mg total) by mouth every 8 (eight) hours as needed for nausea or vomiting. 20 tablet 0   triamcinolone cream (KENALOG) 0.1 % Apply 1 application topically 2 (two) times daily. 80 g 0   CHANTIX STARTING MONTH PAK 0.5 MG X 11 & 1 MG X 42 TBPK Take as directed (Patient not taking: Reported on 01/06/2023) 53 each 0   No current facility-administered medications on file prior to visit.    There are no Patient Instructions on file for this visit. No follow-ups on file.   Georgiana Spinner, NP

## 2023-01-08 ENCOUNTER — Ambulatory Visit: Payer: No Typology Code available for payment source

## 2023-01-08 ENCOUNTER — Telehealth (INDEPENDENT_AMBULATORY_CARE_PROVIDER_SITE_OTHER): Payer: Self-pay

## 2023-01-08 NOTE — Telephone Encounter (Signed)
I attempted to contact the patient to schedule a mesenteric angio with Dr. Gilda Crease. A message was left for a return call.

## 2023-01-11 ENCOUNTER — Ambulatory Visit: Payer: No Typology Code available for payment source | Admitting: Internal Medicine

## 2023-01-12 ENCOUNTER — Ambulatory Visit
Admission: RE | Admit: 2023-01-12 | Discharge: 2023-01-12 | Disposition: A | Discharge status: Home / Self Care | Payer: No Typology Code available for payment source | Source: Ambulatory Visit | Attending: Internal Medicine | Admitting: Internal Medicine

## 2023-01-12 ENCOUNTER — Telehealth (INDEPENDENT_AMBULATORY_CARE_PROVIDER_SITE_OTHER): Payer: Self-pay

## 2023-01-12 DIAGNOSIS — Z1231 Encounter for screening mammogram for malignant neoplasm of breast: Secondary | ICD-10-CM

## 2023-01-12 DIAGNOSIS — R928 Other abnormal and inconclusive findings on diagnostic imaging of breast: Secondary | ICD-10-CM | POA: Diagnosis not present

## 2023-01-12 NOTE — Telephone Encounter (Signed)
Patient left a message for a return call. I returned the call and had to leave a message for a return call.

## 2023-01-12 NOTE — Telephone Encounter (Signed)
Spoke with the patient and she is scheduled with Dr. Gilda Crease for a mesenteric angio on 01/26/23 with a 11:00 am arrival time to the [facility]. Pre-procedure instructions were discussed and will be sent to Mychart and mailed.

## 2023-01-13 ENCOUNTER — Other Ambulatory Visit: Payer: Self-pay | Admitting: Internal Medicine

## 2023-01-13 DIAGNOSIS — N6320 Unspecified lump in the left breast, unspecified quadrant: Secondary | ICD-10-CM

## 2023-01-13 DIAGNOSIS — R928 Other abnormal and inconclusive findings on diagnostic imaging of breast: Secondary | ICD-10-CM

## 2023-01-13 DIAGNOSIS — N6489 Other specified disorders of breast: Secondary | ICD-10-CM

## 2023-01-14 ENCOUNTER — Ambulatory Visit
Admission: RE | Admit: 2023-01-14 | Discharge: 2023-01-14 | Disposition: A | Discharge status: Home / Self Care | Payer: No Typology Code available for payment source | Source: Ambulatory Visit | Attending: Nurse Practitioner | Admitting: Nurse Practitioner

## 2023-01-14 DIAGNOSIS — E278 Other specified disorders of adrenal gland: Secondary | ICD-10-CM | POA: Insufficient documentation

## 2023-01-14 DIAGNOSIS — R928 Other abnormal and inconclusive findings on diagnostic imaging of breast: Secondary | ICD-10-CM | POA: Insufficient documentation

## 2023-01-14 DIAGNOSIS — D3502 Benign neoplasm of left adrenal gland: Secondary | ICD-10-CM | POA: Diagnosis not present

## 2023-01-26 ENCOUNTER — Encounter
Admission: RE | Disposition: A | Discharge status: Home / Self Care | Payer: Self-pay | Source: Home / Self Care | Attending: Vascular Surgery

## 2023-01-26 ENCOUNTER — Ambulatory Visit
Admission: RE | Admit: 2023-01-26 | Discharge: 2023-01-26 | Disposition: A | Discharge status: Home / Self Care | Payer: No Typology Code available for payment source | Attending: Vascular Surgery | Admitting: Vascular Surgery

## 2023-01-26 ENCOUNTER — Other Ambulatory Visit: Payer: Self-pay

## 2023-01-26 ENCOUNTER — Encounter: Payer: Self-pay | Admitting: Vascular Surgery

## 2023-01-26 DIAGNOSIS — I700 Atherosclerosis of aorta: Secondary | ICD-10-CM

## 2023-01-26 DIAGNOSIS — I708 Atherosclerosis of other arteries: Secondary | ICD-10-CM

## 2023-01-26 DIAGNOSIS — E119 Type 2 diabetes mellitus without complications: Secondary | ICD-10-CM | POA: Diagnosis not present

## 2023-01-26 DIAGNOSIS — R109 Unspecified abdominal pain: Secondary | ICD-10-CM | POA: Diagnosis not present

## 2023-01-26 DIAGNOSIS — I10 Essential (primary) hypertension: Secondary | ICD-10-CM | POA: Diagnosis not present

## 2023-01-26 DIAGNOSIS — F17210 Nicotine dependence, cigarettes, uncomplicated: Secondary | ICD-10-CM | POA: Diagnosis not present

## 2023-01-26 DIAGNOSIS — K551 Chronic vascular disorders of intestine: Secondary | ICD-10-CM

## 2023-01-26 DIAGNOSIS — I6523 Occlusion and stenosis of bilateral carotid arteries: Secondary | ICD-10-CM | POA: Diagnosis not present

## 2023-01-26 HISTORY — PX: VISCERAL ANGIOGRAPHY: CATH118276

## 2023-01-26 LAB — CREATININE, SERUM
Creatinine, Ser: 0.59 mg/dL (ref 0.44–1.00)
GFR, Estimated: >60 mL/min (ref >60)

## 2023-01-26 LAB — BUN: BUN: 13 mg/dL (ref 8–23)

## 2023-01-26 SURGERY — VISCERAL ANGIOGRAPHY
Anesthesia: Moderate Sedation

## 2023-01-26 MED ORDER — SODIUM CHLORIDE 0.9 % IV SOLN: INTRAVENOUS | Status: DC

## 2023-01-26 MED ORDER — SODIUM CHLORIDE 0.9% FLUSH: 3.0000 mL | Freq: Two times a day (BID) | INTRAVENOUS | Status: DC

## 2023-01-26 MED ORDER — HYDRALAZINE HCL 20 MG/ML IJ SOLN
5.0000 mg | INTRAMUSCULAR | Status: AC | PRN
  Administered 2023-01-26: 5 mg via INTRAVENOUS

## 2023-01-26 MED ORDER — MIDAZOLAM HCL 5 MG/5ML IJ SOLN
INTRAMUSCULAR | Status: AC
  Filled 2023-01-26: qty 5

## 2023-01-26 MED ORDER — MIDAZOLAM HCL 2 MG/ML PO SYRP: 8.0000 mg | ORAL_SOLUTION | Freq: Once | ORAL | Status: DC | PRN

## 2023-01-26 MED ORDER — IODIXANOL 320 MG/ML IV SOLN
INTRAVENOUS | Status: DC | PRN
  Administered 2023-01-26: 85 mL via INTRA_ARTERIAL

## 2023-01-26 MED ORDER — MORPHINE SULFATE (PF) 4 MG/ML IV SOLN: 2.0000 mg | INTRAVENOUS | Status: DC | PRN

## 2023-01-26 MED ORDER — CEFAZOLIN SODIUM-DEXTROSE 2-4 GM/100ML-% IV SOLN
INTRAVENOUS | Status: AC
  Filled 2023-01-26: qty 100

## 2023-01-26 MED ORDER — SODIUM CHLORIDE 0.9% FLUSH: 3.0000 mL | INTRAVENOUS | Status: DC | PRN

## 2023-01-26 MED ORDER — SODIUM CHLORIDE 0.9 % IV SOLN: 250.0000 mL | INTRAVENOUS | Status: DC | PRN

## 2023-01-26 MED ORDER — ACETAMINOPHEN 325 MG PO TABS
ORAL_TABLET | ORAL | Status: AC
  Filled 2023-01-26: qty 2

## 2023-01-26 MED ORDER — HEPARIN (PORCINE) IN NACL 1000-0.9 UT/500ML-% IV SOLN
INTRAVENOUS | Status: DC | PRN
  Administered 2023-01-26: 1000 mL

## 2023-01-26 MED ORDER — HYDROMORPHONE HCL 1 MG/ML IJ SOLN: 1.0000 mg | Freq: Once | INTRAMUSCULAR | Status: DC | PRN

## 2023-01-26 MED ORDER — HEPARIN SODIUM (PORCINE) 1000 UNIT/ML IJ SOLN
INTRAMUSCULAR | Status: DC | PRN
  Administered 2023-01-26: 6000 [IU] via INTRAVENOUS
  Administered 2023-01-26: 1000 [IU] via INTRAVENOUS

## 2023-01-26 MED ORDER — LIDOCAINE HCL (PF) 1 % IJ SOLN
INTRAMUSCULAR | Status: DC | PRN
  Administered 2023-01-26: 10 mL via INTRADERMAL

## 2023-01-26 MED ORDER — FENTANYL CITRATE (PF) 100 MCG/2ML IJ SOLN
INTRAMUSCULAR | Status: DC | PRN
  Administered 2023-01-26: 50 ug via INTRAVENOUS
  Administered 2023-01-26: 25 ug via INTRAVENOUS

## 2023-01-26 MED ORDER — ONDANSETRON HCL 4 MG/2ML IJ SOLN: 4.0000 mg | Freq: Four times a day (QID) | INTRAMUSCULAR | Status: DC | PRN

## 2023-01-26 MED ORDER — PANTOPRAZOLE SODIUM 40 MG PO TBEC: 40.0000 mg | DELAYED_RELEASE_TABLET | Freq: Every day | ORAL | 11 refills | Status: DC

## 2023-01-26 MED ORDER — SODIUM CHLORIDE 0.9 % IV SOLN: INTRAVENOUS | Status: AC

## 2023-01-26 MED ORDER — OXYCODONE HCL 5 MG PO TABS: 5.0000 mg | ORAL_TABLET | ORAL | Status: DC | PRN

## 2023-01-26 MED ORDER — ACETAMINOPHEN 325 MG PO TABS
650.0000 mg | ORAL_TABLET | ORAL | Status: DC | PRN
  Administered 2023-01-26: 650 mg via ORAL

## 2023-01-26 MED ORDER — HEPARIN SODIUM (PORCINE) 1000 UNIT/ML IJ SOLN
INTRAMUSCULAR | Status: AC
  Filled 2023-01-26: qty 10

## 2023-01-26 MED ORDER — CLOPIDOGREL BISULFATE 75 MG PO TABS
ORAL_TABLET | ORAL | Status: AC
  Administered 2023-01-26: 300 mg via ORAL
  Filled 2023-01-26: qty 4

## 2023-01-26 MED ORDER — HYDRALAZINE HCL 20 MG/ML IJ SOLN
INTRAMUSCULAR | Status: AC
  Administered 2023-01-26: 5 mg via INTRAVENOUS
  Filled 2023-01-26: qty 1

## 2023-01-26 MED ORDER — METHYLPREDNISOLONE SODIUM SUCC 125 MG IJ SOLR: 125.0000 mg | Freq: Once | INTRAMUSCULAR | Status: DC | PRN

## 2023-01-26 MED ORDER — MIDAZOLAM HCL 2 MG/2ML IJ SOLN
INTRAMUSCULAR | Status: DC | PRN
  Administered 2023-01-26: 1 mg via INTRAVENOUS
  Administered 2023-01-26: 2 mg via INTRAVENOUS

## 2023-01-26 MED ORDER — LIDOCAINE-EPINEPHRINE (PF) 1 %-1:200000 IJ SOLN
INTRAMUSCULAR | Status: DC | PRN
  Administered 2023-01-26: 10 mL via INTRADERMAL

## 2023-01-26 MED ORDER — ASPIRIN 81 MG PO TBEC
162.0000 mg | DELAYED_RELEASE_TABLET | Freq: Every day | ORAL | Status: DC
  Administered 2023-01-26: 162 mg via ORAL

## 2023-01-26 MED ORDER — CEFAZOLIN SODIUM-DEXTROSE 2-4 GM/100ML-% IV SOLN
2.0000 g | INTRAVENOUS | Status: AC
  Administered 2023-01-26: 2 g via INTRAVENOUS

## 2023-01-26 MED ORDER — CLOPIDOGREL BISULFATE 75 MG PO TABS: 75.0000 mg | ORAL_TABLET | Freq: Every day | ORAL | 5 refills | Status: DC

## 2023-01-26 MED ORDER — ASPIRIN 81 MG PO TBEC: 81.0000 mg | DELAYED_RELEASE_TABLET | Freq: Every day | ORAL | 1 refills | Status: AC

## 2023-01-26 MED ORDER — CLOPIDOGREL BISULFATE 300 MG PO TABS: 300.0000 mg | ORAL_TABLET | Freq: Every day | ORAL | Status: DC

## 2023-01-26 MED ORDER — CLOPIDOGREL BISULFATE 300 MG PO TABS: 300.0000 mg | ORAL_TABLET | Freq: Once | ORAL | Status: AC

## 2023-01-26 MED ORDER — ASPIRIN 81 MG PO TBEC
DELAYED_RELEASE_TABLET | ORAL | Status: AC
  Filled 2023-01-26: qty 2

## 2023-01-26 MED ORDER — FAMOTIDINE 20 MG PO TABS: 40.0000 mg | ORAL_TABLET | Freq: Once | ORAL | Status: DC | PRN

## 2023-01-26 MED ORDER — FENTANYL CITRATE (PF) 100 MCG/2ML IJ SOLN
INTRAMUSCULAR | Status: AC
  Filled 2023-01-26: qty 2

## 2023-01-26 MED ORDER — DIPHENHYDRAMINE HCL 50 MG/ML IJ SOLN: 50.0000 mg | Freq: Once | INTRAMUSCULAR | Status: DC | PRN

## 2023-01-26 MED ORDER — LABETALOL HCL 5 MG/ML IV SOLN: 10.0000 mg | INTRAVENOUS | Status: DC | PRN

## 2023-01-26 SURGICAL SUPPLY — 31 items
BALLN LUTONIX 018 4X40X130 (BALLOONS) ×1
BALLN ULTRVRSE 4X40X130C (BALLOONS) ×1
BALLN ULTRVRSE 8X40X75C (BALLOONS) ×1
BALLOON LUTONIX 018 4X40X130 (BALLOONS) IMPLANT
BALLOON ULTRVRSE 4X40X130C (BALLOONS) IMPLANT
BALLOON ULTRVRSE 8X40X75C (BALLOONS) IMPLANT
CATH ANGIO 5F PIGTAIL 65CM (CATHETERS) IMPLANT
CATH BEACON 5 .035 65 KMP TIP (CATHETERS) IMPLANT
CATH MICROCATH PRGRT 2.8F 110 (CATHETERS) IMPLANT
CATH VS15FR (CATHETERS) IMPLANT
DEVICE STARCLOSE SE CLOSURE (Vascular Products) IMPLANT
DEVICE TORQUE (MISCELLANEOUS) IMPLANT
GLIDEWIRE STIFF .35X180X3 HYDR (WIRE) IMPLANT
GUIDEWIRE ADV .018X180CM (WIRE) IMPLANT
GUIDEWIRE PFTE-COATED .018X300 (WIRE) IMPLANT
GUIDEWIRE SUPER STIFF .035X180 (WIRE) IMPLANT
KIT ENCORE 26 ADVANTAGE (KITS) IMPLANT
MICROCATH PROGREAT 2.8F 110 CM (CATHETERS) ×1
NDL ENTRY 21GA 7CM ECHOTIP (NEEDLE) IMPLANT
NEEDLE ENTRY 21GA 7CM ECHOTIP (NEEDLE) ×1 IMPLANT
PACK ANGIOGRAPHY (CUSTOM PROCEDURE TRAY) ×1 IMPLANT
SET INTRO CAPELLA COAXIAL (SET/KITS/TRAYS/PACK) IMPLANT
SHEATH ANL 5FRX45 (SHEATH) IMPLANT
SHEATH ANL2 6FRX45 HC (SHEATH) IMPLANT
SHEATH BRITE TIP 5FRX11 (SHEATH) IMPLANT
STENT LIFESTREAM 7X26X80 (Permanent Stent) IMPLANT
SYR MEDRAD MARK 7 150ML (SYRINGE) IMPLANT
TUBING CONTRAST HIGH PRESS 72 (TUBING) IMPLANT
WIRE AMPLATZ SSTIFF .035X260CM (WIRE) IMPLANT
WIRE GUIDERIGHT .035X150 (WIRE) IMPLANT
WIRE SHEPHERD 30G .018 (WIRE) IMPLANT

## 2023-01-26 NOTE — Op Note (Signed)
[facility]  Percutaneous Study/Intervention Procedural Note   Date of Surgery: 01/26/2023  Surgeon: Levora Dredge  Pre-operative Diagnosis:   Post-operative diagnosis:  Same  Procedure(s) Performed:  1.  Abdominal aortogram in the lateral views  2.  Selective injection of the superior mesenteric artery third order catheter placement  3.  Percutaneous transluminal angioplasty and stent placement of the superior mesenteric artery artery             4.  StarClose closure right common femoral artery     Anesthesia: Conscious sedation was administered under my direct supervision by the interventional radiology RN. IV Versed plus fentanyl were utilized. Continuous ECG, pulse oximetry and blood pressure was monitored throughout the entire procedure.  Conscious sedation was for a total of 96 minutes.  Sheath: 6 [place] Ansell right common femoral retrograde  Contrast: 85 cc  Fluoroscopy Time: 22.6 minutes  Indications:  Alexandra Copeland presented with abdominal pain and significant weight loss.  Physical examination as well as the patient's history and noninvasive studies are significant for chronic mesenteric ischemia. The risks and benefits of angiography with possible intervention have been reviewed, all questions were answered and the patient has agreed to proceed with angiography and possible intervention for relief of her mesenteric ischemia.  Procedure:  Alexandra Copeland is a 77 y.o. female who was identified and appropriate procedural time out was performed.  The patient was then placed supine on the table and prepped and draped in the usual sterile fashion.    Ultrasound was used to evaluate the right common femoral artery.  It was pulsatile and echolucent indicating it was patent .  A micropuncture needle was used to access the right common femoral artery under direct ultrasound guidance and an image was recorded for the permanent record.  A 0.035 J wire  was advanced without resistance and a 5Fr sheath was placed.  Stiff angled glide wire was then advanced into the ascending thoracic aorta without difficulty and the pigtail catheter was advanced over the wire.  A 5 [place] was then placed.  Pigtail catheter was positioned to the level of T10 and AP and lateral view of the aorta was obtained. This localized the SMA and celiac artery origins.  The celiac artery origin appears to be occluded as well as a subtotal occlusion of the SMA.  In fact to get better visualization we had to increase the injection to 15 cc/s for 30 cc.  Working in the lateral projection pigtail catheter was exchanged for a V S1 catheter over a Stiff Angled Glidewire and the SMA was engaged with the catheter, this represented first order catheter placement.  At this point to allow better purchase I was able to advance a 4 mm x 40 mm Lutonix drug-eluting balloon and ballooned the ostial lesion of the SMA to 10 atm for proximately 1 minute.  After this I was able to navigate the sheath through the lesion and into the proximal SMA.  6000 units of heparin was given and allowed to circulate for several minutes.  With the V S1 in the proximal SMA it appeared that there was significant mid and distal disease in the SMA.  Several different catheters different wires, Kumpe catheter advantage wires etc. were utilized to try to cross the mid to distal occlusion.  I was able to get the [employer] sheath into the proximal SMA demonstrating the proximal branches and intraluminal positioning.  Ultimately I was not able to cross the  distal lesion and elected to move forward with stenting as there were multiple large branches that would be reperfused including perfusion of the celiac through the middle colic which was now quite prominent.  A Amplatz wire was then advanced through the Kumpe catheter and the Kumpe catheter removed.  The 5 [place] was exchanged for 6 [place].  A 4 mm x 40 mm  Ultraverse balloon was advanced into the distal sheath.  This was then measured and a distance of 39 mm was made.  This verified that measurements were accurate at this obliquity.  I then remove the balloon and pulled the sheath back slightly and performed hand-injection filling the SMA.  SMA was measured and measured 8 mm in diameter.  Therefore a 7 mm x 26 mm Lifestream stent was selected.  The 7 mm x 26 mm Lifestream stent was advanced over the wire positioned across the lesion follow-up hand injection contrast was used to verify stent placement and then the stent was deployed to 12 atm for 30 seconds. Follow-up imaging demonstrated the stent in excellent position but somewhat undersized and a 8 mm x 40 mm Ultraverse balloon was then advanced over the wire and used to post-dilate the stent to 10 atm for 1 minute. Follow-up imaging now demonstrated an excellent result with a stent that was well approximated and of appropriate diameter to the celiac artery. Distal runoff is preserved.  After review these images the catheters removed over wire oblique view of the right groin is obtained and a minx device deployed without difficulty there are no immediate complications.  Findings:   Aortogram: Initial aortogram demonstrates profound calcifications throughout the entire visceral segment of the aorta as well as the infrarenal aorta.  Although there is dense calcification there does not appear to be a focal hemodynamically significant stenosis in the aorta.  The origin of the celiac appears to be occluded.  The origin of the SMA is a string sign only identified after a more concentrated injection of contrast.  Once the SMA have been selected there appears to be a mid to distal occlusion.  The proximal branches are all widely patent and the middle colic fills the celiac rapidly.  Following angioplasty and stent placement there is wide patency with a stent protruding approximately 3 mm into the aorta.  The distal  runoff is preserved compared to the initial imaging.     Disposition: Patient was taken to the recovery room in stable condition having tolerated the procedure well.  Earl Lites Oddie Kuhlmann 01/26/2023,2:28 PM

## 2023-01-26 NOTE — Interval H&P Note (Signed)
History and Physical Interval Note:  01/26/2023 12:47 PM  Alexandra Copeland  has presented today for surgery, with the diagnosis of Mesenteric Angio   Mesenteric artery stenosis.  The various methods of treatment have been discussed with the patient and family. After consideration of risks, benefits and other options for treatment, the patient has consented to  Procedure(s): VISCERAL ANGIOGRAPHY (N/A) as a surgical intervention.  The patient's history has been reviewed, patient examined, no change in status, stable for surgery.  I have reviewed the patient's chart and labs.  Questions were answered to the patient's satisfaction.     Levora Dredge

## 2023-01-26 NOTE — Discharge Instructions (Signed)
Groin Insertion Instructions-If you lose feeling or develop tingling or pain in your leg or foot after the procedure, please walk around first.  If the discomfort does not improve , contact your physician and proceed to the nearest emergency room.  Loss of feeling in your leg might mean that a blockage has formed in the artery and this can be appropriately treated.  Limit your activity for the next two days after your procedure.  Avoid stooping, bending, heavy lifting or exertion as this may put pressure on the insertion site.  Resume normal activities in 48 hours.  You may shower after 24 hours but avoid excessive warm water and do not scrub the site.  Remove clear dressing in 48 hours.  If you have had a closure device inserted, do not soak in a tub bath or a hot tub for at least one week.  No driving for 48 hours after discharge.  After the procedure, check the insertion site occasionally.  If any oozing occurs or there is apparent swelling, firm pressure over the site will prevent a bruise from forming.  You can not hurt anything by pressing directly on the site.  The pressure stops the bleeding by allowing a small clot to form.  If the bleeding continues after the pressure has been applied for more than 15 minutes, call 911 or go to the nearest emergency room.    The x-ray dye causes you to pass a considerate amount of urine.  For this reason, you will be asked to drink plenty of liquids after the procedure to prevent dehydration.  You may resume you regular diet.  Avoid caffeine products.    For pain at the site of your procedure, take non-aspirin medicines such as Tylenol.  Medications: A. Hold Metformin for 48 hours if applicable.  B. Continue taking all your present medications at home unless your doctor prescribes any changes.  Angiogram, Care After This sheet gives you information about how to care for yourself after your procedure. Your health care provider may also give you more specific  instructions. If you have problems or questions, contact your health care provider. What can I expect after the procedure? After the procedure, it is common to have: Bruising and tenderness at the catheter insertion area. A collection of blood (hematoma) at the insertion area. This may feel like a small lump under the skin at the insertion site. Follow these instructions at home: Insertion site care  Follow instructions from your health care provider about how to take care of your insertion site. Make sure you: You wound site has been closed with a skin glue that will gradually wear off. Do not peel or pick skin glue. Let it come off on it's own.    You may shower tomorrow.  Gently wash the area with plain soap and water. Pat the area dry with a clean towel. Do not rub the site. This may cause bleeding. Check your insertion site every day for signs of infection. Check for: Redness, swelling, or pain. Fluid or blood. Warmth. Pus or a bad smell. Do not apply powder or lotion to the site. Keep the site clean and dry. Activity Do not drive for 24 hours if you were given a sedative during your procedure. Rest as told by your health care provider, usually for 1-2 days. Do not lift anything that is heavier than 10 lb (4.5 kg), or the limit that you are told, until your health care provider says that it is   safe. Return to your normal activities  in about a week.   General instructions  If your insertion site starts bleeding, lie flat and put pressure on the site. If the bleeding does not stop, get help right away. This is a medical emergency. Take over-the-counter and prescription medicines only as told by your health care provider. Drink enough fluid to keep your urine pale yellow. This helps flush the contrast dye from your body. Keep all follow-up visits as told by your health care provider. This is important. Contact a health care provider if: You have a fever or chills. You have  redness, swelling, or pain around your insertion site. You have fluid or blood coming from your insertion site. Your insertion site feels warm to the touch. You have pus or a bad smell coming from your insertion site. You have more bruising around the insertion site. Get help right away if you have: A problem with the insertion area, such as: The area swells fast or bleeds even after you apply pressure. The area becomes pale, cool, tingly, or numb. Chest pain. Trouble breathing. A rash. These symptoms may represent a serious problem that is an emergency. Do not wait to see if the symptoms will go away. Get medical help right away. Call your local emergency services (911 in the [place] Do not drive yourself to the hospital. Summary It is common to have bruising and tenderness at the catheter insertion area. Do not take baths, swim, or use a hot tub until your health care provider approves. You may shower 24-48 hours after the procedure or as told. It is important to rest and drink plenty of fluids. If the insertion site bleeds, lie flat and put pressure on the site. If the bleeding continues, get help right away. This is a medical emergency. This information is not intended to replace advice given to you by your health care provider. Make sure you discuss any questions you have with your health care provider. Document Revised: 05/31/2019 Document Reviewed: 05/31/2019 

## 2023-01-27 ENCOUNTER — Telehealth (INDEPENDENT_AMBULATORY_CARE_PROVIDER_SITE_OTHER): Payer: Self-pay

## 2023-01-27 ENCOUNTER — Encounter: Payer: Self-pay | Admitting: Vascular Surgery

## 2023-01-27 ENCOUNTER — Other Ambulatory Visit (INDEPENDENT_AMBULATORY_CARE_PROVIDER_SITE_OTHER): Payer: Self-pay | Admitting: Nurse Practitioner

## 2023-01-27 MED ORDER — ONDANSETRON HCL 4 MG PO TABS: 4.0000 mg | ORAL_TABLET | Freq: Three times a day (TID) | ORAL | 0 refills | Status: DC | PRN

## 2023-01-27 NOTE — Telephone Encounter (Signed)
Talked with daughter and noted that she had significant narrowing of her SMA and she may be experiencing some reperfusion injury currently.  Advised that patient should continue with liquids and try to advance her diet slowly to see if she is able to tolerate this especially since she has not been eating significantly.  Zofran was sent in for the patient.  We will continue with current follow-up as of now.

## 2023-01-27 NOTE — Telephone Encounter (Signed)
Patient's daughter called stating the patient was throwing up off and on for about 30 minutes yesterday with diarrhea. Patient does not have and other symptoms other than she cannot eat. Patient had a Visceral angio with Dr. Gilda Crease on 01/26/23. Patient's daughter also stated she hasn't eaten since November 29, 2022. Please advise.

## 2023-02-16 ENCOUNTER — Ambulatory Visit (INDEPENDENT_AMBULATORY_CARE_PROVIDER_SITE_OTHER): Payer: No Typology Code available for payment source | Admitting: Nurse Practitioner

## 2023-02-16 ENCOUNTER — Encounter (INDEPENDENT_AMBULATORY_CARE_PROVIDER_SITE_OTHER): Payer: Self-pay | Admitting: Nurse Practitioner

## 2023-02-16 DIAGNOSIS — E1159 Type 2 diabetes mellitus with other circulatory complications: Secondary | ICD-10-CM | POA: Diagnosis not present

## 2023-02-16 DIAGNOSIS — E1169 Type 2 diabetes mellitus with other specified complication: Secondary | ICD-10-CM | POA: Diagnosis not present

## 2023-02-16 DIAGNOSIS — I152 Hypertension secondary to endocrine disorders: Secondary | ICD-10-CM | POA: Diagnosis not present

## 2023-02-16 DIAGNOSIS — E785 Hyperlipidemia, unspecified: Secondary | ICD-10-CM

## 2023-02-16 DIAGNOSIS — K551 Chronic vascular disorders of intestine: Secondary | ICD-10-CM | POA: Diagnosis not present

## 2023-02-16 NOTE — Progress Notes (Signed)
Subjective:    Patient ID: Alexandra Copeland, female    DOB: 02-06-1946, 77 y.o.   MRN: [id number] Chief Complaint  Patient presents with   Follow-up    3 weeks (02/16/2023); follow up after procedure with mesenteric angiogram    The patient presents to the office for follow-up regarding chronic mesenteric ischemia associated with stenosis of the SMA and celiac arteries.  The patient underwent angiogram on 01/26/2023 with stent placement to the SMA.  It was noted that there was continued occlusion of the celiac but with collateralization.  She had issues with nausea and diarrhea immediately following the procedure but this has resolved.  She notes that she is able to eat without abdominal pain however she is not able to eat large meals before feeling full sooner.  She also notes that her activity has increased but she still gets fatigued with short distances.  She notes that much of her fatigue and issues with eating actually began about 2 months prior to her intervention.  They are improved but she is frustrated at the progress.  The patient does not substantiate food fear, particular foods do not seem to aggravate or alleviate the symptoms.  The patient denies bloody bowel movements or diarrhea.    The patient has a history of colonoscopy which was not diagnostic.    No history of peptic ulcer disease.    The patient denies amaurosis fugax or recent TIA symptoms. There are no recent neurological changes noted. The patient denies claudication symptoms or rest pain symptoms. The patient denies history of DVT, PE or superficial thrombophlebitis. The patient denies recent episodes of angina      Review of Systems  Gastrointestinal:        Bloating  Neurological:  Positive for weakness.  All other systems reviewed and are negative.      Objective:   Physical Exam Vitals reviewed.  HENT:     Head: Normocephalic.  Cardiovascular:     Rate and Rhythm: Normal rate.     Pulses:           Dorsalis pedis pulses are 2+ on the right side and 1+ on the left side.  Pulmonary:     Effort: Pulmonary effort is normal.  Skin:    General: Skin is warm and dry.  Neurological:     Mental Status: She is alert and oriented to person, place, and time.  Psychiatric:        Mood and Affect: Mood normal.        Behavior: Behavior normal.        Thought Content: Thought content normal.        Judgment: Judgment normal.     BP (!) 153/71 (BP Location: Left Arm)   Pulse 66   Resp 18   Ht 5\' 7"  (1.702 m)   Wt 125 lb 3.2 oz (56.8 kg)   BMI 19.61 kg/m   Past Medical History:  Diagnosis Date   Hyperlipidemia    Hypertension    Hypothyroid    Scarlet fever    as a child   Stroke [facility]) June 2010   left parietal hypertensive    Social History   Socioeconomic History   Marital status: Single    Spouse name: Not on file   Number of children: Not on file   Years of education: Not on file   Highest education level: Bachelor's degree (e.g., BA, AB, BS)  Occupational History   Not on file  Tobacco Use   Smoking status: Every Day    Packs/day: 1.00    Years: 26.00    Additional pack years: 0.00    Total pack years: 26.00    Types: Cigarettes    Start date: 05/10/2018   Smokeless tobacco: Never   Tobacco comments:    quit for several years.  Has recently started back.  Vaping Use   Vaping Use: Never used  Substance and Sexual Activity   Alcohol use: Not Currently    Alcohol/week: 1.0 standard drink of alcohol    Types: 1 Glasses of wine per week    Comment: OCC glass of wine.none last 24hrs   Drug use: Yes    Comment: one valium a day   Sexual activity: Never  Other Topics Concern   Not on file  Social History Narrative   Not on file   Social Determinants of Health   Financial Resource Strain: Low Risk  (12/08/2022)   Overall Financial Resource Strain (CARDIA)    Difficulty of Paying Living Expenses: Not very hard  Food Insecurity: No Food Insecurity (12/08/2022)    Hunger Vital Sign    Worried About Running Out of Food in the Last Year: Never true    Ran Out of Food in the Last Year: Never true  Transportation Needs: No Transportation Needs (12/08/2022)   PRAPARE - Administrator, Civil Service (Medical): No    Lack of Transportation (Non-Medical): No  Physical Activity: Sufficiently Active (12/08/2022)   Exercise Vital Sign    Days of Exercise per Week: 7 days    Minutes of Exercise per Session: 40 min  Stress: No Stress Concern Present (12/08/2022)   [employer] of Occupational Health - Occupational Stress Questionnaire    Feeling of Stress : Only a little  Social Connections: Moderately Isolated (12/08/2022)   Social Connection and Isolation Panel [NHANES]    Frequency of Communication with Friends and Family: Three times a week    Frequency of Social Gatherings with Friends and Family: Once a week    Attends Religious Services: 1 to 4 times per year    Active Member of [employer] or Organizations: No    Attends Engineer, structural: Not on file    Marital Status: Divorced  Intimate Partner Violence: Not At Risk (03/12/2022)   Humiliation, Afraid, Rape, and Kick questionnaire    Fear of Current or Ex-Partner: No    Emotionally Abused: No    Physically Abused: No    Sexually Abused: No    Past Surgical History:  Procedure Laterality Date   BREAST CYST ASPIRATION Left 1978   neg   BREAST SURGERY  1975   abscess drained   CATARACT EXTRACTION W/PHACO Left 02/20/2020   Procedure: CATARACT EXTRACTION PHACO AND INTRAOCULAR LENS PLACEMENT (IOC) LEFT DIABETIC 6.77 00:41.8;  Surgeon: Nevada Crane, MD;  Location: [facility];  Service: Ophthalmology;  Laterality: Left;   CATARACT EXTRACTION W/PHACO Right 03/18/2020   Procedure: CATARACT EXTRACTION PHACO AND INTRAOCULAR LENS PLACEMENT (IOC) RIGHT DIABETIC 9.71  01:00.9;  Surgeon: Nevada Crane, MD;  Location: [facility];  Service: Ophthalmology;   Laterality: Right;   COLONOSCOPY WITH PROPOFOL N/A 01/06/2022   Procedure: COLONOSCOPY WITH PROPOFOL;  Surgeon: Wyline Mood, MD;  Location: [facility] END[facility];  Service: Gastroenterology;  Laterality: N/A;   TUBAL LIGATION     VISCERAL ANGIOGRAPHY N/A 01/26/2023   Procedure: VISCERAL ANGIOGRAPHY;  Surgeon: Renford Dills, MD;  Location: [facility] INVASIVE CV  LAB;  Service: Cardiovascular;  Laterality: N/A;    Family History  Problem Relation Age of Onset   Heart disease Mother    Hypertension Mother    Stroke Father    Hyperlipidemia Father    Vision loss Father    Hypertension Brother    Rheum arthritis Brother    Diabetes Paternal Grandmother    Breast cancer Neg Hx     No Known Allergies     Latest Ref Rng & Units 12/29/2022    1:51 PM 12/11/2022   11:55 AM 09/19/2021   11:18 AM  CBC  WBC 4.0 - 10.5 K/uL 9.2  7.5  5.3   Hemoglobin 12.0 - 15.0 g/dL 16.1  09.6  04.5   Hematocrit 36.0 - 46.0 % 47.6  46.1  45.2   Platelets 150 - 400 K/uL 241  235.0  189.0       CMP     Component Value Date/Time   NA 136 12/29/2022 1351   K 3.0 (L) 12/29/2022 1351   CL 98 12/29/2022 1351   CO2 28 12/29/2022 1351   GLUCOSE 139 (H) 12/29/2022 1351   BUN 13 01/26/2023 1107   CREATININE 0.59 01/26/2023 1107   CALCIUM 9.4 12/29/2022 1351   PROT 7.3 12/29/2022 1351   ALBUMIN 4.5 12/29/2022 1351   AST 26 12/29/2022 1351   ALT 17 12/29/2022 1351   ALKPHOS 66 12/29/2022 1351   BILITOT 1.3 (H) 12/29/2022 1351   GFR 77.52 12/11/2022 1155   GFRNONAA >60 01/26/2023 1107     No results found.     Assessment & Plan:   1. Mesenteric artery stenosis (HCC) The patient has some improvement from her baseline prior to intervention however she is frustrated because she is not progressing as fast as she would like.  I had a discussion with the patient regarding her symptoms and I suspect that because she has not been able to eat large meals over time it will take time for her to be able to eat larger  meals as her body begins to adapt.  She is also advised to try to build up her stamina and exercise because again this will take time to improve over time.  Will have her return in 4 weeks in order to have a mesenteric duplex performed as well as to evaluate progress of her symptoms.  2. Hypertension associated with type 2 diabetes mellitus (HCC) Continue antihypertensive medications as already ordered, these medications have been reviewed and there are no changes at this time.  3. Hyperlipidemia associated with type 2 diabetes mellitus (HCC) Continue statin as ordered and reviewed, no changes at this time   Current Outpatient Medications on File Prior to Visit  Medication Sig Dispense Refill   amLODipine (NORVASC) 2.5 MG tablet Take 1 tablet (2.5 mg total) by mouth daily. 90 tablet 1   aspirin EC 81 MG tablet Take 1 tablet (81 mg total) by mouth daily. Swallow whole. 150 tablet 1   atorvastatin (LIPITOR) 20 MG tablet Take 1 tablet (20 mg total) by mouth daily. 90 tablet 3   clopidogrel (PLAVIX) 75 MG tablet Take 1 tablet (75 mg total) by mouth daily. 30 tablet 5   diazepam (VALIUM) 5 MG tablet Take 1 tablet (5 mg total) by mouth every 12 (twelve) hours as needed for anxiety (vertigo). 20 tablet 0   fluticasone (FLONASE) 50 MCG/ACT nasal spray Place 2 sprays into both nostrils daily. 16 g 6   levothyroxine (SYNTHROID) 88 MCG tablet  TAKE 1 TABLET BY MOUTH ONCE DAILY BEFORE BREAKFAST 90 tablet 1   losartan (COZAAR) 100 MG tablet Take 1 tablet (100 mg total) by mouth daily. 90 tablet 3   Multiple Vitamin (MULTIVITAMIN) tablet Take 1 tablet by mouth daily.     ondansetron (ZOFRAN) 4 MG tablet Take 1 tablet (4 mg total) by mouth every 8 (eight) hours as needed for nausea or vomiting. 20 tablet 0   ondansetron (ZOFRAN) 4 MG tablet Take 1 tablet (4 mg total) by mouth every 8 (eight) hours as needed for nausea or vomiting. 20 tablet 0   pantoprazole (PROTONIX) 40 MG tablet Take 1 tablet (40 mg total)  by mouth daily. 30 tablet 11   polyethylene glycol (MIRALAX / GLYCOLAX) 17 g packet Take 17 g by mouth daily.     triamcinolone cream (KENALOG) 0.1 % Apply 1 application topically 2 (two) times daily. (Patient taking differently: Apply 1 application  topically as needed.) 80 g 0   No current facility-administered medications on file prior to visit.    There are no Patient Instructions on file for this visit. No follow-ups on file.   Georgiana Spinner, NP

## 2023-03-05 ENCOUNTER — Telehealth (INDEPENDENT_AMBULATORY_CARE_PROVIDER_SITE_OTHER): Payer: Self-pay | Admitting: Vascular Surgery

## 2023-03-05 NOTE — Telephone Encounter (Signed)
Patient son called in stating that patient had a procedure a few week ago with Dr. Gilda Crease. Patient is still having symptoms and losing weight. Patients son is concerned and wis upset that patients next appointment is not until 03/24/2023.    Patient son is requesting a call back  [phone]  Please call and advise

## 2023-03-10 ENCOUNTER — Encounter (INDEPENDENT_AMBULATORY_CARE_PROVIDER_SITE_OTHER): Payer: Self-pay

## 2023-03-11 ENCOUNTER — Telehealth (INDEPENDENT_AMBULATORY_CARE_PROVIDER_SITE_OTHER): Payer: Self-pay

## 2023-03-11 DIAGNOSIS — Z961 Presence of intraocular lens: Secondary | ICD-10-CM | POA: Diagnosis not present

## 2023-03-11 DIAGNOSIS — E119 Type 2 diabetes mellitus without complications: Secondary | ICD-10-CM | POA: Diagnosis not present

## 2023-03-11 DIAGNOSIS — H26491 Other secondary cataract, right eye: Secondary | ICD-10-CM | POA: Diagnosis not present

## 2023-03-11 LAB — HM DIABETES EYE EXAM

## 2023-03-11 NOTE — Telephone Encounter (Signed)
Let's bring her in Monday to see GS with a mesenteric.  Overbook if necessary

## 2023-03-11 NOTE — Telephone Encounter (Signed)
Patient left a voicemail stating she is sick not getting better after her procedure on 6/18. After returning her call the patient stated that during her follow up visit she was told she was having an ultra sound on 7/23, but her appt is 03/24/23. She thinks she will die before then (Patient started crying). Patient stated that she doesn't have any energy, she's vomiting everything she tries to eat, she has lost 26 lbs since her last appt 7/9, and she has a constant pain that sit in the top of her stomach. Sort of like a gassy pain. She's taking tylenol for pain and it doesn't help. She stated she has followed all the instructions given to her as in taking the nausea medications for relief to eat, and noting has been working. " She just wants relief, she feels something isn't right, she can't do life like this."   Please advise

## 2023-03-12 ENCOUNTER — Other Ambulatory Visit (INDEPENDENT_AMBULATORY_CARE_PROVIDER_SITE_OTHER): Payer: Self-pay | Admitting: Vascular Surgery

## 2023-03-12 DIAGNOSIS — K551 Chronic vascular disorders of intestine: Secondary | ICD-10-CM

## 2023-03-12 DIAGNOSIS — Z959 Presence of cardiac and vascular implant and graft, unspecified: Secondary | ICD-10-CM

## 2023-03-14 DIAGNOSIS — E1110 Type 2 diabetes mellitus with ketoacidosis without coma: Secondary | ICD-10-CM

## 2023-03-14 DIAGNOSIS — K551 Chronic vascular disorders of intestine: Secondary | ICD-10-CM | POA: Insufficient documentation

## 2023-03-14 HISTORY — DX: Type 2 diabetes mellitus with ketoacidosis without coma: E11.10

## 2023-03-14 HISTORY — DX: Chronic vascular disorders of intestine: K55.1

## 2023-03-14 NOTE — Progress Notes (Unsigned)
MRN : [id number]  Alexandra Copeland is a 77 y.o. (Mar 29, 1946) female who presents with chief complaint of check circulation.  History of Present Illness:   The patient presents to the office for follow-up regarding chronic mesenteric ischemia associated with stenosis of the SMA and celiac arteries.  The patient underwent angiogram on 01/26/2023 with stent placement to the SMA.  It was noted that there was continued occlusion of the celiac but with collateralization.  She had issues with nausea and diarrhea immediately following the procedure but this has resolved.  She notes that she is able to eat without abdominal pain however she is not able to eat large meals before feeling full sooner.  She also notes that her activity has increased but she still gets fatigued with short distances.  She notes that much of her fatigue and issues with eating actually began about 2 months prior to her intervention.  They are improved but she is frustrated at the progress.  The patient does not substantiate food fear, particular foods do not seem to aggravate or alleviate the symptoms.  The patient denies bloody bowel movements or diarrhea.     The patient has a history of colonoscopy which was not diagnostic.     No history of peptic ulcer disease.      The patient denies amaurosis fugax or recent TIA symptoms. There are no recent neurological changes noted. The patient denies claudication symptoms or rest pain symptoms. The patient denies history of DVT, PE or superficial thrombophlebitis. The patient denies recent episodes of angina       No outpatient medications have been marked as taking for the 03/15/23 encounter (Appointment) with Gilda Crease, Latina Craver, MD.    Past Medical History:  Diagnosis Date   Hyperlipidemia    Hypertension    Hypothyroid    Scarlet fever    as a child   Stroke [facility]) June 2010   left parietal hypertensive     Past Surgical History:  Procedure Laterality Date   BREAST CYST ASPIRATION Left 1978   neg   BREAST SURGERY  1975   abscess drained   CATARACT EXTRACTION W/PHACO Left 02/20/2020   Procedure: CATARACT EXTRACTION PHACO AND INTRAOCULAR LENS PLACEMENT (IOC) LEFT DIABETIC 6.77 00:41.8;  Surgeon: Nevada Crane, MD;  Location: [facility];  Service: Ophthalmology;  Laterality: Left;   CATARACT EXTRACTION W/PHACO Right 03/18/2020   Procedure: CATARACT EXTRACTION PHACO AND INTRAOCULAR LENS PLACEMENT (IOC) RIGHT DIABETIC 9.71  01:00.9;  Surgeon: Nevada Crane, MD;  Location: [facility];  Service: Ophthalmology;  Laterality: Right;   COLONOSCOPY WITH PROPOFOL N/A 01/06/2022   Procedure: COLONOSCOPY WITH PROPOFOL;  Surgeon: Wyline Mood, MD;  Location: [facility] ENDOSCOPY;  Service: Gastroenterology;  Laterality: N/A;   TUBAL LIGATION     VISCERAL ANGIOGRAPHY N/A 01/26/2023   Procedure: VISCERAL ANGIOGRAPHY;  Surgeon: Renford Dills, MD;  Location: [facility] INVASIVE CV LAB;  Service: Cardiovascular;  Laterality: N/A;    Social History Social History   Tobacco Use   Smoking status: Every Day    Current packs/day: 1.00  Average packs/day: 1 pack/day for 26.0 years (26.0 ttl pk-yrs)    Types: Cigarettes    Start date: 05/10/2018   Smokeless tobacco: Never   Tobacco comments:    quit for several years.  Has recently started back.  Vaping Use   Vaping status: Never Used  Substance Use Topics   Alcohol use: Not Currently    Alcohol/week: 1.0 standard drink of alcohol    Types: 1 Glasses of wine per week    Comment: OCC glass of wine.none last 24hrs   Drug use: Yes    Comment: one valium a day    Family History Family History  Problem Relation Age of Onset   Heart disease Mother    Hypertension Mother    Stroke Father    Hyperlipidemia Father    Vision loss Father    Hypertension Brother    Rheum arthritis Brother    Diabetes Paternal Grandmother    Breast  cancer Neg Hx     No Known Allergies   REVIEW OF SYSTEMS (Negative unless checked)  Constitutional: [] Weight loss  [] Fever  [] Chills Cardiac: [] Chest pain   [] Chest pressure   [] Palpitations   [] Shortness of breath when laying flat   [] Shortness of breath with exertion. Vascular:  [x] Pain in legs with walking   [] Pain in legs at rest  [] History of DVT   [] Phlebitis   [] Swelling in legs   [] Varicose veins   [] Non-healing ulcers Pulmonary:   [] Uses home oxygen   [] Productive cough   [] Hemoptysis   [] Wheeze  [] COPD   [] Asthma Neurologic:  [] Dizziness   [] Seizures   [] History of stroke   [] History of TIA  [] Aphasia   [] Vissual changes   [] Weakness or numbness in arm   [] Weakness or numbness in leg Musculoskeletal:   [] Joint swelling   [] Joint pain   [] Low back pain Hematologic:  [] Easy bruising  [] Easy bleeding   [] Hypercoagulable state   [] Anemic Gastrointestinal:  [] Diarrhea   [] Vomiting  [] Gastroesophageal reflux/heartburn   [] Difficulty swallowing. Genitourinary:  [] Chronic kidney disease   [] Difficult urination  [] Frequent urination   [] Blood in urine Skin:  [] Rashes   [] Ulcers  Psychological:  [] History of anxiety   []  History of major depression.  Physical Examination  There were no vitals filed for this visit. There is no height or weight on file to calculate BMI. Gen: WD/WN, NAD Head: [place]/AT, No temporalis wasting.  Ear/Nose/Throat: Hearing grossly intact, nares w/o erythema or drainage Eyes: PER, EOMI, sclera nonicteric.  Neck: Supple, no masses.  No bruit or JVD.  Pulmonary:  Good air movement, no audible wheezing, no use of accessory muscles.  Cardiac: RRR, normal S1, S2, no Murmurs. Vascular:  mild trophic changes, no open wounds Vessel Right Left  Radial Palpable Palpable  PT Not Palpable Not Palpable  DP Not Palpable Not Palpable  Gastrointestinal: soft, non-distended. No guarding/no peritoneal signs.  Musculoskeletal: M/S 5/5 throughout.  No visible deformity.   Neurologic: CN 2-12 intact. Pain and light touch intact in extremities.  Symmetrical.  Speech is fluent. Motor exam as listed above. Psychiatric: Judgment intact, Mood & affect appropriate for pt's clinical situation. Dermatologic: No rashes or ulcers noted.  No changes consistent with cellulitis.   CBC Lab Results  Component Value Date   WBC 9.2 12/29/2022   HGB 16.2 (H) 12/29/2022   HCT 47.6 (H) 12/29/2022   MCV 95.8 12/29/2022   PLT 241 12/29/2022    BMET    Component Value Date/Time  NA 136 12/29/2022 1351   K 3.0 (L) 12/29/2022 1351   CL 98 12/29/2022 1351   CO2 28 12/29/2022 1351   GLUCOSE 139 (H) 12/29/2022 1351   BUN 13 01/26/2023 1107   CREATININE 0.59 01/26/2023 1107   CALCIUM 9.4 12/29/2022 1351   GFRNONAA >60 01/26/2023 1107   CrCl cannot be calculated (Patient's most recent lab result is older than the maximum 21 days allowed.).  COAG Lab Results  Component Value Date   INR 1.1 12/29/2022    Radiology No results found.   Assessment/Plan There are no diagnoses linked to this encounter.   Levora Dredge, MD  03/14/2023 3:13 PM

## 2023-03-15 ENCOUNTER — Encounter (INDEPENDENT_AMBULATORY_CARE_PROVIDER_SITE_OTHER): Payer: Self-pay | Admitting: Vascular Surgery

## 2023-03-15 ENCOUNTER — Ambulatory Visit (INDEPENDENT_AMBULATORY_CARE_PROVIDER_SITE_OTHER): Payer: No Typology Code available for payment source

## 2023-03-15 ENCOUNTER — Ambulatory Visit (INDEPENDENT_AMBULATORY_CARE_PROVIDER_SITE_OTHER): Payer: No Typology Code available for payment source | Admitting: Vascular Surgery

## 2023-03-15 VITALS — BP 84/56 | HR 64 | Resp 18 | Ht 67.0 in | Wt 118.4 lb

## 2023-03-15 DIAGNOSIS — Z959 Presence of cardiac and vascular implant and graft, unspecified: Secondary | ICD-10-CM | POA: Diagnosis not present

## 2023-03-15 DIAGNOSIS — I152 Hypertension secondary to endocrine disorders: Secondary | ICD-10-CM

## 2023-03-15 DIAGNOSIS — E785 Hyperlipidemia, unspecified: Secondary | ICD-10-CM

## 2023-03-15 DIAGNOSIS — E1159 Type 2 diabetes mellitus with other circulatory complications: Secondary | ICD-10-CM

## 2023-03-15 DIAGNOSIS — E1110 Type 2 diabetes mellitus with ketoacidosis without coma: Secondary | ICD-10-CM | POA: Diagnosis not present

## 2023-03-15 DIAGNOSIS — E1169 Type 2 diabetes mellitus with other specified complication: Secondary | ICD-10-CM | POA: Diagnosis not present

## 2023-03-15 DIAGNOSIS — K551 Chronic vascular disorders of intestine: Secondary | ICD-10-CM

## 2023-03-15 MED ORDER — ONDANSETRON HCL 4 MG PO TABS
4.0000 mg | ORAL_TABLET | Freq: Three times a day (TID) | ORAL | 0 refills | Status: DC | PRN
Start: 2023-03-15 — End: 2023-03-15

## 2023-03-15 MED ORDER — ONDANSETRON HCL 4 MG PO TABS
4.0000 mg | ORAL_TABLET | Freq: Three times a day (TID) | ORAL | 1 refills | Status: DC | PRN
Start: 2023-03-15 — End: 2023-03-15

## 2023-03-16 ENCOUNTER — Other Ambulatory Visit: Payer: Self-pay | Admitting: Internal Medicine

## 2023-03-17 ENCOUNTER — Telehealth: Payer: Self-pay | Admitting: Physician Assistant

## 2023-03-17 NOTE — Telephone Encounter (Signed)
Called the patient to confirm her appointment for Monday 03/17/23 with Inetta Fermo. Patient said she will be here.

## 2023-03-17 NOTE — Progress Notes (Signed)
Celso Amy, PA-C [address]  [address]  [place], [place] [address]  Main: [phone]  Fax: [phone]   Primary Care Physician: Sherlene Shams, MD  Primary Gastroenterologist:  Celso Amy, PA-C / Dr. Wyline Mood    CC: Nausea, vomiting, weight loss  HPI: Alexandra Copeland is a 77 y.o. female patient is referred to evaluate nausea, vomiting, and weight loss for 5 months.  Referred by vascular surgeon Dr. Dellia Beckwith.  She has chronic mesenteric ischemia associated with stenosis of SMA and celiac arteries.  Underwent angiogram 01/26/2023 with stent placement to SMA. Unable to stent Celiac Artery. Abdominal pain improved post procedure and then returned.   She has continued to have some episodes of vomiting undigested food.  Dr. Marcha Dutton recommended she follow-up with GI to schedule EGD and gastric emptying study.  She has generalized periumbilical and bilateral lower abdominal pain.  Not able to eat due to N/V.  Weight is down 30 pounds in 5 months (from 145 lb to 115 lb.)  She is taking Miralax 1-2 capfuls daily for constipation.  Colonoscopy done by Dr. Tobi Bastos 12/2021, for positive Cologuard test, showed good prep, and 1 small 5 mm rectal polyp removed.  Pandiverticulosis.  No previous EGD.  No history of peptic ulcer disease.  Labs 12/2022: Negative H. Pylori. Normal WBC 9.2, Hgb 16.2, BUN 17, Cr 0.79, Lipase 27.  Normal LFTs except slightly elevated T. Bili 1.3.  Low Potassium 3.0.  Glucose 139.  CT Abd / Pelvis with contrast 12/29/22: Small dependent gallstone, No cholecystitis, Normal liver / pancreas, left adrenal nodule, bilateral small kidney cysts, diffuse colonic stool, NO obstruction, enlarged 10mm appendix, diffuse vascular calcifications with areas of multifocal stenosis.  MRI Abdomen w/o contrast 01/14/23 (to f/u left adrenal nodule): A few tiny sub-cm gallstones, 1.9cm benign left adrenal adenoma.  Currently taking aspirin and Plavix daily.  History of CVA,  hypertension, diabetes, tobacco abuse. Takes Protonix 40 mg daily for GERD and MiraLAX for chronic constipation.  Current Outpatient Medications  Medication Sig Dispense Refill   amLODipine (NORVASC) 2.5 MG tablet Take 1 tablet (2.5 mg total) by mouth daily. 90 tablet 1   aspirin EC 81 MG tablet Take 1 tablet (81 mg total) by mouth daily. Swallow whole. 150 tablet 1   atorvastatin (LIPITOR) 20 MG tablet Take 1 tablet (20 mg total) by mouth daily. 90 tablet 3   clopidogrel (PLAVIX) 75 MG tablet Take 1 tablet (75 mg total) by mouth daily. 30 tablet 5   diazepam (VALIUM) 5 MG tablet Take 1 tablet (5 mg total) by mouth every 12 (twelve) hours as needed for anxiety (vertigo). 20 tablet 0   fluticasone (FLONASE) 50 MCG/ACT nasal spray Place 2 sprays into both nostrils daily. 16 g 6   hydrochlorothiazide (HYDRODIURIL) 25 MG tablet Take 25 mg by mouth daily.     levothyroxine (SYNTHROID) 88 MCG tablet TAKE 1 TABLET BY MOUTH ONCE DAILY BEFORE BREAKFAST 90 tablet 1   losartan (COZAAR) 100 MG tablet Take 1 tablet (100 mg total) by mouth daily. 90 tablet 3   Multiple Vitamin (MULTIVITAMIN) tablet Take 1 tablet by mouth daily.     pantoprazole (PROTONIX) 40 MG tablet Take 1 tablet (40 mg total) by mouth daily. 30 tablet 11   polyethylene glycol (MIRALAX / GLYCOLAX) 17 g packet Take 17 g by mouth daily.     triamcinolone cream (KENALOG) 0.1 % Apply 1 application topically 2 (two) times daily. (Patient taking differently: Apply  1 application  topically as needed.) 80 g 0   No current facility-administered medications for this visit.    Allergies as of 03/18/2023   (No Known Allergies)    Past Medical History:  Diagnosis Date   Chronic mesenteric ischemia (HCC) 03/14/2023   Chronic sinus bradycardia 11/30/2022   Constipation 12/31/2022   DM (diabetes mellitus) type 2, uncontrolled, with ketoacidosis (HCC) 03/14/2023   GERD (gastroesophageal reflux disease) 03/26/2022   History of tobacco abuse  02/05/2018   Hyperlipidemia    Hypertension    Hypothyroid    Mesenteric artery stenosis (HCC) 03/14/2023   Scarlet fever    as a child   Stroke (HCC) 01/2009   left parietal hypertensive    Past Surgical History:  Procedure Laterality Date   BREAST CYST ASPIRATION Left 1978   neg   BREAST SURGERY  1975   abscess drained   CATARACT EXTRACTION W/PHACO Left 02/20/2020   Procedure: CATARACT EXTRACTION PHACO AND INTRAOCULAR LENS PLACEMENT (IOC) LEFT DIABETIC 6.77 00:41.8;  Surgeon: Nevada Crane, MD;  Location: [facility];  Service: Ophthalmology;  Laterality: Left;   CATARACT EXTRACTION W/PHACO Right 03/18/2020   Procedure: CATARACT EXTRACTION PHACO AND INTRAOCULAR LENS PLACEMENT (IOC) RIGHT DIABETIC 9.71  01:00.9;  Surgeon: Nevada Crane, MD;  Location: [facility];  Service: Ophthalmology;  Laterality: Right;   COLONOSCOPY WITH PROPOFOL N/A 01/06/2022   Procedure: COLONOSCOPY WITH PROPOFOL;  Surgeon: Wyline Mood, MD;  Location: [facility] ENDOSCOPY;  Service: Gastroenterology;  Laterality: N/A;   TUBAL LIGATION     VISCERAL ANGIOGRAPHY N/A 01/26/2023   Procedure: VISCERAL ANGIOGRAPHY;  Surgeon: Renford Dills, MD;  Location: [facility] INVASIVE CV LAB;  Service: Cardiovascular;  Laterality: N/A;    Review of Systems:    All systems reviewed and negative except where noted in HPI.   Physical Examination:   BP 99/62   Pulse 86   Temp 97.9 F (36.6 C)   Ht 5\' 7"  (1.702 m)   Wt 115 lb 9.6 oz (52.4 kg)   BMI 18.11 kg/m   General: Thin, elderly, mildly ill appearing female;  in no acute distress.  Eyes: No icterus. Conjunctivae pink. Mouth: Oropharyngeal mucosa moist and pink , no lesions erythema or exudate. Lungs: Clear to auscultation bilaterally. Non-labored. Heart: Regular rate and rhythm, no murmurs rubs or gallops.  Abdomen: Bowel sounds are normal; Abdomen is Soft; No hepatosplenomegaly, masses or hernias;  Moderate Bilateral Lower Abdominal Tenderness;  No upper abdominal tendernes; No guarding or rebound tenderness. Extremities: No lower extremity edema. No clubbing or deformities. Neuro: Alert and oriented x 3.  Grossly intact. Skin: Warm and dry, no jaundice.   Psych: Alert and cooperative, normal mood and affect.   Imaging Studies: VAS [place] MESENTERIC  Result Date: 03/18/2023 ABDOMINAL VISCERAL Patient Name:  Alexandra Copeland [facility]  Date of Exam:   03/15/2023 Medical Rec #: [id number]          Accession #:    [id number] Date of Birth: April 30, 1946           Patient Gender: F Patient Age:   22 years Exam Location:  [facility] Procedure:      VAS [place] MESENTERIC Referring Phys: [id number] Latina Craver SCHNIER -------------------------------------------------------------------------------- Indications: Abdomen pain Vascular Interventions: 01/26/23: SMA PTA/stent with celiac artery occlusion;. Performing Technologist: Jamse Mead RT, RDMS, RVT  Examination Guidelines: A complete evaluation includes B-mode imaging, spectral Doppler, color Doppler, and power Doppler as needed of all accessible portions of each vessel.  Bilateral testing is considered an integral part of a complete examination. Limited examinations for reoccurring indications may be performed as noted.  Duplex Findings: +-------------+--------+--------+--------+------------------+ Mesenteric   PSV cm/sEDV cm/s Plaque      Comments      +-------------+--------+--------+--------+------------------+ Aorta Prox      96           calcific                   +-------------+--------+--------+--------+------------------+ Celiac Artery                         known occlusion   +-------------+--------+--------+--------+------------------+ SMA Origin      60      20                 stent        +-------------+--------+--------+--------+------------------+ SMA Proximal    71      32                 stent        +-------------+--------+--------+--------+------------------+ SMA Mid          49      21                              +-------------+--------+--------+--------+------------------+ CHA             44      23           fed via collateral +-------------+--------+--------+--------+------------------+ Splenic         43      25           fed via collateral +-------------+--------+--------+--------+------------------+ IMA            205      48   calcific                   +-------------+--------+--------+--------+------------------+  Summary: Mesenteric: Patent SMA stent with no evidence of stenosis. Known celiac artery occlusion with patent hepatic and splenic arteries noted.  *See table(s) above for measurements and observations.  Diagnosing physician: Levora Dredge MD  Electronically signed by Levora Dredge MD on 03/18/2023 at 3:12:29 PM.    Final     Assessment and Plan:   Alexandra Copeland is a 77 y.o. y/o female presents for N/V, Abd pain, Weight loss x 5 months.  1.  Nausea and vomiting  Scheduling Gastric Empty Study to evaluate for Gastroparesis  Continue Zofran prn.  Scheduling EGD I discussed risks of EGD with patient to include risk of bleeding, perforation, and risk of sedation.  Patient expressed understanding and agrees to proceed with EGD.   2.  Abdominal pain - Mostly peri-umbilical and bilateral lower abdominal Pain.    Labs: CBC, CMP, Lipase  3.  Chronic mesenteric ischemia s/p stent placement to superior mesenteric artery 01/26/2023  Followed by vascular surgeon Dr. Marcha Dutton  Requesting permission to hold Plavix 5 days prior to EGD; remain on aspirin during EGD.  4.  GERD  Continue pantoprazole 40 Mg daily  5.  Chronic constipation  Continue MiraLAX 2 capfuls daily  6.  Weight Loss - 30 pound weight loss in 5 months. Likely due to N/V & Chronic mesenteric ischemia.  Negative Colonoscopy 12/2021.   Long time smoker.  Normal CXR 12/29/22.  Consider Chest CT if persist.  7.  Cholelithiasis - small gallstones (No RUQ  tenderness)  8.  Abnormal Abdominal / Pelvic CT - Enlarged 10mm appendix but no inflammatory changes;   If she has worsening RLQ pain or Elevated WBC, then order repeat CT abd/pelvis w/cm.  Celso Amy, PA-C  Follow up after EGD with Dr. Tobi Bastos.  Also f/u based on test results and symptoms.

## 2023-03-18 ENCOUNTER — Ambulatory Visit (INDEPENDENT_AMBULATORY_CARE_PROVIDER_SITE_OTHER): Payer: No Typology Code available for payment source | Admitting: Physician Assistant

## 2023-03-18 ENCOUNTER — Encounter: Payer: Self-pay | Admitting: Physician Assistant

## 2023-03-18 VITALS — BP 99/62 | HR 86 | Temp 97.9°F | Ht 67.0 in | Wt 115.6 lb

## 2023-03-18 DIAGNOSIS — K551 Chronic vascular disorders of intestine: Secondary | ICD-10-CM

## 2023-03-18 DIAGNOSIS — R634 Abnormal weight loss: Secondary | ICD-10-CM

## 2023-03-18 DIAGNOSIS — K219 Gastro-esophageal reflux disease without esophagitis: Secondary | ICD-10-CM | POA: Diagnosis not present

## 2023-03-18 DIAGNOSIS — R1084 Generalized abdominal pain: Secondary | ICD-10-CM

## 2023-03-18 DIAGNOSIS — R112 Nausea with vomiting, unspecified: Secondary | ICD-10-CM | POA: Diagnosis not present

## 2023-03-18 NOTE — Patient Instructions (Addendum)
Gastric Emptying study scheduled 04-09-23 @ 7:45 arrival @ [facility]. Nothing to eat/drink after midnight.  Stop the Protonix 8 hours prior.

## 2023-03-19 ENCOUNTER — Telehealth: Payer: Self-pay

## 2023-03-19 DIAGNOSIS — I708 Atherosclerosis of other arteries: Secondary | ICD-10-CM | POA: Diagnosis not present

## 2023-03-19 DIAGNOSIS — E039 Hypothyroidism, unspecified: Secondary | ICD-10-CM | POA: Diagnosis not present

## 2023-03-19 DIAGNOSIS — E43 Unspecified severe protein-calorie malnutrition: Secondary | ICD-10-CM | POA: Diagnosis not present

## 2023-03-19 DIAGNOSIS — E278 Other specified disorders of adrenal gland: Secondary | ICD-10-CM | POA: Diagnosis not present

## 2023-03-19 DIAGNOSIS — K551 Chronic vascular disorders of intestine: Secondary | ICD-10-CM | POA: Diagnosis not present

## 2023-03-19 DIAGNOSIS — R7401 Elevation of levels of liver transaminase levels: Secondary | ICD-10-CM | POA: Diagnosis not present

## 2023-03-19 DIAGNOSIS — Z8673 Personal history of transient ischemic attack (TIA), and cerebral infarction without residual deficits: Secondary | ICD-10-CM | POA: Diagnosis not present

## 2023-03-19 DIAGNOSIS — I700 Atherosclerosis of aorta: Secondary | ICD-10-CM | POA: Diagnosis not present

## 2023-03-19 DIAGNOSIS — Z681 Body mass index (BMI) 19 or less, adult: Secondary | ICD-10-CM | POA: Diagnosis not present

## 2023-03-19 DIAGNOSIS — R1084 Generalized abdominal pain: Secondary | ICD-10-CM | POA: Diagnosis not present

## 2023-03-19 DIAGNOSIS — D696 Thrombocytopenia, unspecified: Secondary | ICD-10-CM | POA: Diagnosis not present

## 2023-03-19 DIAGNOSIS — Z7902 Long term (current) use of antithrombotics/antiplatelets: Secondary | ICD-10-CM | POA: Diagnosis not present

## 2023-03-19 DIAGNOSIS — E785 Hyperlipidemia, unspecified: Secondary | ICD-10-CM | POA: Diagnosis not present

## 2023-03-19 DIAGNOSIS — I10 Essential (primary) hypertension: Secondary | ICD-10-CM | POA: Diagnosis not present

## 2023-03-19 DIAGNOSIS — D649 Anemia, unspecified: Secondary | ICD-10-CM | POA: Diagnosis not present

## 2023-03-19 DIAGNOSIS — K8020 Calculus of gallbladder without cholecystitis without obstruction: Secondary | ICD-10-CM | POA: Diagnosis not present

## 2023-03-19 DIAGNOSIS — K55069 Acute infarction of intestine, part and extent unspecified: Secondary | ICD-10-CM | POA: Diagnosis not present

## 2023-03-19 DIAGNOSIS — R7303 Prediabetes: Secondary | ICD-10-CM | POA: Diagnosis not present

## 2023-03-19 DIAGNOSIS — I959 Hypotension, unspecified: Secondary | ICD-10-CM | POA: Diagnosis not present

## 2023-03-19 DIAGNOSIS — Z87891 Personal history of nicotine dependence: Secondary | ICD-10-CM | POA: Diagnosis not present

## 2023-03-19 DIAGNOSIS — N200 Calculus of kidney: Secondary | ICD-10-CM | POA: Diagnosis not present

## 2023-03-19 NOTE — Telephone Encounter (Signed)
Refilled 4 days ago by a historical provider. Is it okay to refuse?

## 2023-03-19 NOTE — Telephone Encounter (Signed)
Dr. Lorretta Harp cleared patient to have have EGD done but has to be after 05/11/2023 and she needs to be off Plavix 5 days before procedure and restart it 1 day after. She can not stop taking Plavix till 05/11/2023. She is schedule for 05/04/2023 please reschedule till 05/16/2023 or later.

## 2023-03-21 ENCOUNTER — Other Ambulatory Visit: Payer: Self-pay | Admitting: Internal Medicine

## 2023-03-21 DIAGNOSIS — E785 Hyperlipidemia, unspecified: Secondary | ICD-10-CM | POA: Diagnosis not present

## 2023-03-21 DIAGNOSIS — Z01810 Encounter for preprocedural cardiovascular examination: Secondary | ICD-10-CM | POA: Diagnosis not present

## 2023-03-21 DIAGNOSIS — E034 Atrophy of thyroid (acquired): Secondary | ICD-10-CM

## 2023-03-21 DIAGNOSIS — K551 Chronic vascular disorders of intestine: Secondary | ICD-10-CM | POA: Diagnosis not present

## 2023-03-22 ENCOUNTER — Other Ambulatory Visit (INDEPENDENT_AMBULATORY_CARE_PROVIDER_SITE_OTHER): Payer: Self-pay | Admitting: Vascular Surgery

## 2023-03-22 ENCOUNTER — Telehealth: Payer: Self-pay

## 2023-03-22 DIAGNOSIS — K551 Chronic vascular disorders of intestine: Secondary | ICD-10-CM

## 2023-03-22 NOTE — Telephone Encounter (Signed)
Patient states she is in the hospital at [facility] and missed a call from Sydell Axon, LPN, for her AWV.  Patient states she will call us when everything settles down and she is ready to reschedule.

## 2023-03-23 DIAGNOSIS — R9439 Abnormal result of other cardiovascular function study: Secondary | ICD-10-CM | POA: Diagnosis not present

## 2023-03-23 DIAGNOSIS — E785 Hyperlipidemia, unspecified: Secondary | ICD-10-CM | POA: Diagnosis not present

## 2023-03-23 DIAGNOSIS — I639 Cerebral infarction, unspecified: Secondary | ICD-10-CM | POA: Diagnosis not present

## 2023-03-23 DIAGNOSIS — Z01810 Encounter for preprocedural cardiovascular examination: Secondary | ICD-10-CM | POA: Diagnosis not present

## 2023-03-23 DIAGNOSIS — K551 Chronic vascular disorders of intestine: Secondary | ICD-10-CM | POA: Diagnosis not present

## 2023-03-23 DIAGNOSIS — I10 Essential (primary) hypertension: Secondary | ICD-10-CM | POA: Diagnosis not present

## 2023-03-23 NOTE — Telephone Encounter (Signed)
Noted  

## 2023-03-24 ENCOUNTER — Encounter (INDEPENDENT_AMBULATORY_CARE_PROVIDER_SITE_OTHER): Payer: No Typology Code available for payment source

## 2023-03-24 ENCOUNTER — Ambulatory Visit (INDEPENDENT_AMBULATORY_CARE_PROVIDER_SITE_OTHER): Payer: No Typology Code available for payment source | Admitting: Nurse Practitioner

## 2023-03-25 ENCOUNTER — Telehealth: Payer: Self-pay

## 2023-03-25 DIAGNOSIS — K551 Chronic vascular disorders of intestine: Secondary | ICD-10-CM | POA: Diagnosis not present

## 2023-03-25 NOTE — Telephone Encounter (Signed)
Tried reaching patient-she has recent hospitalization and in care everywhere  where surgical procedure is planned 03-29-23 @ [facility].   I spoke with [facility] and asked that she Cancell procedure on 05/04/23 and leave in depot and as soon as I talk with patient ill call back to get rescheduled.  Please see below.   Dr. Lorretta Harp cleared patient to have have EGD done but has to be after 05/11/2023 and she needs to be off Plavix 5 days before procedure and restart it 1 day after. She can not stop taking Plavix till 05/11/2023. She is schedule for 05/04/2023 please reschedule till 05/16/2023 or later.   Spoke with patient-she stated she is on her way to ED -she is sick-and she said to cancell the procedures but I talked her into waiting to see what ED finds out and that we would talk next week.

## 2023-03-29 DIAGNOSIS — D696 Thrombocytopenia, unspecified: Secondary | ICD-10-CM | POA: Diagnosis not present

## 2023-03-29 DIAGNOSIS — R001 Bradycardia, unspecified: Secondary | ICD-10-CM | POA: Diagnosis not present

## 2023-03-29 DIAGNOSIS — I959 Hypotension, unspecified: Secondary | ICD-10-CM | POA: Diagnosis not present

## 2023-03-29 DIAGNOSIS — K551 Chronic vascular disorders of intestine: Secondary | ICD-10-CM | POA: Diagnosis not present

## 2023-03-30 DIAGNOSIS — K55059 Acute (reversible) ischemia of intestine, part and extent unspecified: Secondary | ICD-10-CM | POA: Diagnosis not present

## 2023-03-30 DIAGNOSIS — G8918 Other acute postprocedural pain: Secondary | ICD-10-CM | POA: Diagnosis not present

## 2023-03-30 DIAGNOSIS — E878 Other disorders of electrolyte and fluid balance, not elsewhere classified: Secondary | ICD-10-CM | POA: Diagnosis not present

## 2023-03-30 DIAGNOSIS — K551 Chronic vascular disorders of intestine: Secondary | ICD-10-CM | POA: Diagnosis not present

## 2023-03-30 DIAGNOSIS — Z955 Presence of coronary angioplasty implant and graft: Secondary | ICD-10-CM | POA: Diagnosis not present

## 2023-03-30 DIAGNOSIS — E43 Unspecified severe protein-calorie malnutrition: Secondary | ICD-10-CM | POA: Diagnosis not present

## 2023-03-31 DIAGNOSIS — K8020 Calculus of gallbladder without cholecystitis without obstruction: Secondary | ICD-10-CM | POA: Diagnosis not present

## 2023-03-31 DIAGNOSIS — E278 Other specified disorders of adrenal gland: Secondary | ICD-10-CM | POA: Diagnosis not present

## 2023-03-31 DIAGNOSIS — K551 Chronic vascular disorders of intestine: Secondary | ICD-10-CM | POA: Diagnosis not present

## 2023-03-31 DIAGNOSIS — R188 Other ascites: Secondary | ICD-10-CM | POA: Diagnosis not present

## 2023-04-01 DIAGNOSIS — R918 Other nonspecific abnormal finding of lung field: Secondary | ICD-10-CM | POA: Diagnosis not present

## 2023-04-01 DIAGNOSIS — J90 Pleural effusion, not elsewhere classified: Secondary | ICD-10-CM | POA: Diagnosis not present

## 2023-04-01 DIAGNOSIS — Z01810 Encounter for preprocedural cardiovascular examination: Secondary | ICD-10-CM | POA: Diagnosis not present

## 2023-04-05 ENCOUNTER — Encounter: Payer: No Typology Code available for payment source | Admitting: Internal Medicine

## 2023-04-06 DIAGNOSIS — K6389 Other specified diseases of intestine: Secondary | ICD-10-CM | POA: Diagnosis not present

## 2023-04-06 DIAGNOSIS — K551 Chronic vascular disorders of intestine: Secondary | ICD-10-CM | POA: Diagnosis not present

## 2023-04-07 DIAGNOSIS — K551 Chronic vascular disorders of intestine: Secondary | ICD-10-CM | POA: Diagnosis not present

## 2023-04-08 ENCOUNTER — Encounter: Payer: No Typology Code available for payment source | Admitting: Internal Medicine

## 2023-04-08 NOTE — Assessment & Plan Note (Deleted)
S/p aortoiiliac bypass Auust 19 at [facility]

## 2023-04-09 ENCOUNTER — Encounter: Payer: No Typology Code available for payment source | Attending: Physician Assistant

## 2023-04-15 DIAGNOSIS — R932 Abnormal findings on diagnostic imaging of liver and biliary tract: Secondary | ICD-10-CM | POA: Diagnosis not present

## 2023-04-15 DIAGNOSIS — D3502 Benign neoplasm of left adrenal gland: Secondary | ICD-10-CM | POA: Diagnosis not present

## 2023-04-22 DIAGNOSIS — I639 Cerebral infarction, unspecified: Secondary | ICD-10-CM | POA: Diagnosis not present

## 2023-04-22 DIAGNOSIS — M6281 Muscle weakness (generalized): Secondary | ICD-10-CM | POA: Diagnosis not present

## 2023-04-22 DIAGNOSIS — R52 Pain, unspecified: Secondary | ICD-10-CM | POA: Diagnosis not present

## 2023-04-22 DIAGNOSIS — R141 Gas pain: Secondary | ICD-10-CM | POA: Diagnosis not present

## 2023-04-22 DIAGNOSIS — K559 Vascular disorder of intestine, unspecified: Secondary | ICD-10-CM | POA: Diagnosis not present

## 2023-04-22 DIAGNOSIS — K551 Chronic vascular disorders of intestine: Secondary | ICD-10-CM | POA: Diagnosis not present

## 2023-04-22 DIAGNOSIS — E46 Unspecified protein-calorie malnutrition: Secondary | ICD-10-CM | POA: Diagnosis not present

## 2023-04-22 DIAGNOSIS — R630 Anorexia: Secondary | ICD-10-CM | POA: Diagnosis not present

## 2023-04-22 DIAGNOSIS — R278 Other lack of coordination: Secondary | ICD-10-CM | POA: Diagnosis not present

## 2023-04-22 DIAGNOSIS — I10 Essential (primary) hypertension: Secondary | ICD-10-CM | POA: Diagnosis not present

## 2023-04-22 DIAGNOSIS — Z743 Need for continuous supervision: Secondary | ICD-10-CM | POA: Diagnosis not present

## 2023-04-22 DIAGNOSIS — K5900 Constipation, unspecified: Secondary | ICD-10-CM | POA: Diagnosis not present

## 2023-04-26 ENCOUNTER — Ambulatory Visit (INDEPENDENT_AMBULATORY_CARE_PROVIDER_SITE_OTHER): Payer: No Typology Code available for payment source | Admitting: Nurse Practitioner

## 2023-04-26 DIAGNOSIS — R531 Weakness: Secondary | ICD-10-CM | POA: Diagnosis not present

## 2023-04-26 DIAGNOSIS — E46 Unspecified protein-calorie malnutrition: Secondary | ICD-10-CM | POA: Diagnosis not present

## 2023-04-26 DIAGNOSIS — R630 Anorexia: Secondary | ICD-10-CM | POA: Diagnosis not present

## 2023-04-27 DIAGNOSIS — I10 Essential (primary) hypertension: Secondary | ICD-10-CM | POA: Diagnosis not present

## 2023-04-27 DIAGNOSIS — K559 Vascular disorder of intestine, unspecified: Secondary | ICD-10-CM | POA: Diagnosis not present

## 2023-04-27 DIAGNOSIS — E785 Hyperlipidemia, unspecified: Secondary | ICD-10-CM | POA: Diagnosis not present

## 2023-04-27 DIAGNOSIS — F419 Anxiety disorder, unspecified: Secondary | ICD-10-CM | POA: Diagnosis not present

## 2023-04-27 DIAGNOSIS — I639 Cerebral infarction, unspecified: Secondary | ICD-10-CM | POA: Diagnosis not present

## 2023-04-27 DIAGNOSIS — E039 Hypothyroidism, unspecified: Secondary | ICD-10-CM | POA: Diagnosis not present

## 2023-04-27 DIAGNOSIS — K5900 Constipation, unspecified: Secondary | ICD-10-CM | POA: Diagnosis not present

## 2023-04-27 DIAGNOSIS — E46 Unspecified protein-calorie malnutrition: Secondary | ICD-10-CM | POA: Diagnosis not present

## 2023-04-29 DIAGNOSIS — I10 Essential (primary) hypertension: Secondary | ICD-10-CM | POA: Diagnosis not present

## 2023-05-04 ENCOUNTER — Ambulatory Visit: Admit: 2023-05-04 | Payer: No Typology Code available for payment source | Admitting: Gastroenterology

## 2023-05-04 DIAGNOSIS — F419 Anxiety disorder, unspecified: Secondary | ICD-10-CM | POA: Diagnosis not present

## 2023-05-04 DIAGNOSIS — E039 Hypothyroidism, unspecified: Secondary | ICD-10-CM | POA: Diagnosis not present

## 2023-05-04 DIAGNOSIS — I10 Essential (primary) hypertension: Secondary | ICD-10-CM | POA: Diagnosis not present

## 2023-05-04 DIAGNOSIS — I639 Cerebral infarction, unspecified: Secondary | ICD-10-CM | POA: Diagnosis not present

## 2023-05-04 DIAGNOSIS — K5900 Constipation, unspecified: Secondary | ICD-10-CM | POA: Diagnosis not present

## 2023-05-04 DIAGNOSIS — R110 Nausea: Secondary | ICD-10-CM | POA: Diagnosis not present

## 2023-05-04 DIAGNOSIS — K219 Gastro-esophageal reflux disease without esophagitis: Secondary | ICD-10-CM | POA: Diagnosis not present

## 2023-05-04 DIAGNOSIS — R630 Anorexia: Secondary | ICD-10-CM | POA: Diagnosis not present

## 2023-05-04 SURGERY — ESOPHAGOGASTRODUODENOSCOPY (EGD) WITH PROPOFOL
Anesthesia: General

## 2023-05-13 ENCOUNTER — Ambulatory Visit (INDEPENDENT_AMBULATORY_CARE_PROVIDER_SITE_OTHER): Payer: No Typology Code available for payment source | Admitting: Emergency Medicine

## 2023-05-13 VITALS — Ht 67.0 in | Wt 120.0 lb

## 2023-05-13 DIAGNOSIS — Z780 Asymptomatic menopausal state: Secondary | ICD-10-CM | POA: Diagnosis not present

## 2023-05-13 DIAGNOSIS — Z0000 Encounter for general adult medical examination without abnormal findings: Secondary | ICD-10-CM | POA: Diagnosis not present

## 2023-05-13 NOTE — Patient Instructions (Signed)
Alexandra Copeland , Thank you for taking time to come for your Medicare Wellness Visit. I appreciate your ongoing commitment to your health goals. Please review the following plan we discussed and let me know if I can assist you in the future.   Referrals/Orders/Follow-Ups/Clinician Recommendations: I have placed an order for a bone density test. Call [employer] [facility] @ [phone] to schedule at your earliest convenience. Get the flu and tetanus vaccines at your earliest convenience. Discuss with Dr. Darrick Huntsman the need for pneumonia, covid and shingles vaccines at your next OV.   This is a list of the screening recommended for you and due dates:  Health Maintenance  Topic Date Due   Screening for Lung Cancer  Never done   Zoster (Shingles) Vaccine (1 of 2) Never done   DTaP/Tdap/Td vaccine (2 - Td or Tdap) 05/28/2020   DEXA scan (bone density measurement)  03/26/2021   Complete foot exam   03/27/2023   Hemoglobin A1C  04/05/2023   COVID-19 Vaccine (4 - 2023-24 season) 04/11/2023   Flu Shot  11/08/2023*   Yearly kidney health urinalysis for diabetes  10/06/2023   Mammogram  01/12/2024   Eye exam for diabetics  03/10/2024   Yearly kidney function blood test for diabetes  03/18/2024   Medicare Annual Wellness Visit  05/12/2024   Pneumonia Vaccine  Completed   Hepatitis C Screening  Completed   HPV Vaccine  Aged Out   Cologuard (Stool DNA test)  Discontinued  *Topic was postponed. The date shown is not the original due date.    Advanced directives: (Copy Requested) Please bring a copy of your health care power of attorney and living will to the office to be added to your chart at your convenience.  Next Medicare Annual Wellness Visit scheduled for next year: Yes, 05/18/24 @ 11:15am

## 2023-05-13 NOTE — Progress Notes (Signed)
Subjective:   Alexandra Copeland is a 77 y.o. female who presents for Medicare Annual (Subsequent) preventive examination.  Visit Complete: Virtual  I connected with  Alexandra Copeland on 05/13/23 by a audio enabled telemedicine application and verified that I am speaking with the correct person using two identifiers.  Patient Location: Home  Provider Location: Home Office  I discussed the limitations of evaluation and management by telemedicine. The patient expressed understanding and agreed to proceed.  Because this visit was a virtual/telehealth visit, some criteria may be missing or patient reported. Any vitals not documented were not able to be obtained and vitals that have been documented are patient reported.    Cardiac Risk Factors include: advanced age (>9men, >13 women);hypertension;diabetes mellitus;dyslipidemia;Other (see comment), Risk factor comments: Mesenteric artery stenosis     Objective:    Today's Vitals   05/13/23 1243  Weight: 120 lb (54.4 kg)  Height: 5\' 7"  (1.702 m)   Body mass index is 18.79 kg/m.     05/13/2023    1:03 PM 01/26/2023   11:14 AM 12/29/2022    1:45 PM 03/12/2022   11:40 AM 01/06/2022   10:21 AM 03/11/2021   10:55 AM 03/18/2020    7:41 AM  Advanced Directives  Does Patient Have a Medical Advance Directive? Yes No No No No No No  Type of Estate agent of [place];Living will        Does patient want to make changes to medical advance directive? No - Patient declined        Copy of Healthcare Power of Attorney in Chart? No - copy requested        Would patient like information on creating a medical advance directive?  No - Patient declined No - Patient declined No - Patient declined No - Patient declined No - Patient declined No - Patient declined    Current Medications (verified) Outpatient Encounter Medications as of 05/13/2023  Medication Sig   amLODipine (NORVASC) 2.5 MG tablet Take 1 tablet (2.5 mg total) by mouth  daily.   aspirin EC 81 MG tablet Take 1 tablet (81 mg total) by mouth daily. Swallow whole.   atorvastatin (LIPITOR) 20 MG tablet Take 1 tablet (20 mg total) by mouth daily.   clopidogrel (PLAVIX) 75 MG tablet Take 1 tablet (75 mg total) by mouth daily.   dronabinol (MARINOL) 2.5 MG capsule Take 2.5 mg by mouth daily.   fluticasone (FLONASE) 50 MCG/ACT nasal spray Place 2 sprays into both nostrils daily.   hydrochlorothiazide (HYDRODIURIL) 25 MG tablet Take 25 mg by mouth daily.   levothyroxine (SYNTHROID) 88 MCG tablet TAKE 1 TABLET BY MOUTH ONCE DAILY BEFORE BREAKFAST   Multiple Vitamin (MULTIVITAMIN) tablet Take 1 tablet by mouth daily.   pantoprazole (PROTONIX) 40 MG tablet Take 1 tablet (40 mg total) by mouth daily.   polyethylene glycol (MIRALAX / GLYCOLAX) 17 g packet Take 17 g by mouth daily.   triamcinolone cream (KENALOG) 0.1 % Apply 1 application topically 2 (two) times daily. (Patient taking differently: Apply 1 application  topically as needed.)   diazepam (VALIUM) 5 MG tablet Take 1 tablet (5 mg total) by mouth every 12 (twelve) hours as needed for anxiety (vertigo). (Patient not taking: Reported on 05/13/2023)   losartan (COZAAR) 100 MG tablet Take 1 tablet (100 mg total) by mouth daily. (Patient not taking: Reported on 05/13/2023)   ondansetron (ZOFRAN-ODT) 4 MG disintegrating tablet Take 4 mg by mouth every 8 (eight) hours  as needed for nausea. (Patient not taking: Reported on 05/13/2023)   No facility-administered encounter medications on file as of 05/13/2023.    Allergies (verified) Patient has no known allergies.   History: Past Medical History:  Diagnosis Date   Chronic mesenteric ischemia (HCC) 03/14/2023   Chronic sinus bradycardia 11/30/2022   Constipation 12/31/2022   DM (diabetes mellitus) type 2, uncontrolled, with ketoacidosis (HCC) 03/14/2023   GERD (gastroesophageal reflux disease) 03/26/2022   History of tobacco abuse 02/05/2018   Hyperlipidemia     Hypertension    Hypothyroid    Mesenteric artery stenosis (HCC) 03/14/2023   Scarlet fever    as a child   Stroke (HCC) 01/2009   left parietal hypertensive   Past Surgical History:  Procedure Laterality Date   BREAST CYST ASPIRATION Left 1978   neg   BREAST SURGERY  1975   abscess drained   CATARACT EXTRACTION W/PHACO Left 02/20/2020   Procedure: CATARACT EXTRACTION PHACO AND INTRAOCULAR LENS PLACEMENT (IOC) LEFT DIABETIC 6.77 00:41.8;  Surgeon: Nevada Crane, MD;  Location: [facility];  Service: Ophthalmology;  Laterality: Left;   CATARACT EXTRACTION W/PHACO Right 03/18/2020   Procedure: CATARACT EXTRACTION PHACO AND INTRAOCULAR LENS PLACEMENT (IOC) RIGHT DIABETIC 9.71  01:00.9;  Surgeon: Nevada Crane, MD;  Location: [facility];  Service: Ophthalmology;  Laterality: Right;   COLONOSCOPY WITH PROPOFOL N/A 01/06/2022   Procedure: COLONOSCOPY WITH PROPOFOL;  Surgeon: Wyline Mood, MD;  Location: [facility];  Service: Gastroenterology;  Laterality: N/A;   TUBAL LIGATION     VISCERAL ANGIOGRAPHY N/A 01/26/2023   Procedure: VISCERAL ANGIOGRAPHY;  Surgeon: Renford Dills, MD;  Location: [facility] INVASIVE CV [facility];  Service: Cardiovascular;  Laterality: N/A;   Family History  Problem Relation Age of Onset   Heart disease Mother    Hypertension Mother    Stroke Father    Hyperlipidemia Father    Vision loss Father    Hypertension Brother    Rheum arthritis Brother    Diabetes Paternal Grandmother    Breast cancer Neg Hx    Social History   Socioeconomic History   Marital status: Divorced    Spouse name: Not on file   Number of children: 3   Years of education: Not on file   Highest education level: Bachelor's degree (e.g., BA, AB, BS)  Occupational History   Not on file  Tobacco Use   Smoking status: Former    Current packs/day: 0.00    Average packs/day: 1 pack/day for 36.1 years (36.1 ttl pk-yrs)    Types: Cigarettes    Start date: 29    Quit  date: 09/2022    Years since quitting: 0.6   Smokeless tobacco: Never   Tobacco comments:    quit for several years.  Has recently started back. Quit again 09/2022  Vaping Use   Vaping status: Never Used  Substance and Sexual Activity   Alcohol use: Not Currently    Alcohol/week: 1.0 standard drink of alcohol    Types: 1 Glasses of wine per week    Comment: OCC glass of wine.none last 24hrs   Drug use: Not Currently    Comment: one valium a day   Sexual activity: Never  Other Topics Concern   Not on file  Social History Narrative   Not on file   Social Determinants of Health   Financial Resource Strain: Low Risk  (05/13/2023)   Overall Financial Resource Strain (CARDIA)    Difficulty of Paying Living  Expenses: Not hard at all  Food Insecurity: No Food Insecurity (05/13/2023)   Hunger Vital Sign    Worried About Running Out of Food in the Last Year: Never true    Ran Out of Food in the Last Year: Never true  Transportation Needs: No Transportation Needs (05/13/2023)   PRAPARE - Administrator, Civil Service (Medical): No    Lack of Transportation (Non-Medical): No  Physical Activity: Sufficiently Active (05/13/2023)   Exercise Vital Sign    Days of Exercise per Week: 7 days    Minutes of Exercise per Session: 30 min  Stress: No Stress Concern Present (05/13/2023)   [employer] of Occupational Health - Occupational Stress Questionnaire    Feeling of Stress : Not at all  Social Connections: Moderately Isolated (05/13/2023)   Social Connection and Isolation Panel [NHANES]    Frequency of Communication with Friends and Family: Three times a week    Frequency of Social Gatherings with Friends and Family: Once a week    Attends Religious Services: More than 4 times per year    Active Member of [employer] or Organizations: No    Attends Banker Meetings: Never    Marital Status: Divorced    Tobacco Counseling Counseling given: Not Answered Tobacco  comments: quit for several years.  Has recently started back. Quit again 09/2022   Clinical Intake:  Pre-visit preparation completed: Yes  Pain : No/denies pain     BMI - recorded: 18.79 Nutritional Status: BMI <19  Underweight Nutritional Risks: None Diabetes: Yes CBG done?: No Did pt. bring in CBG monitor from home?: No  How often do you need to have someone help you when you read instructions, pamphlets, or other written materials from your doctor or pharmacy?: 1 - Never  Interpreter Needed?: No  Information entered by :: Tora Kindred, CMA   Activities of Daily Living    05/13/2023   12:49 PM 01/26/2023   11:11 AM  In your present state of health, do you have any difficulty performing the following activities:  Hearing? 0 0  Vision? 0 1  Difficulty concentrating or making decisions? 0 1  Walking or climbing stairs? 0 0  Dressing or bathing? 0 0  Doing errands, shopping? 0   Preparing Food and eating ? N   Using the Toilet? N   In the past six months, have you accidently leaked urine? Y   Comment wears depends   Do you have problems with loss of bowel control? N   Managing your Medications? N   Managing your Finances? N   Housekeeping or managing your Housekeeping? N     Patient Care Team: Sherlene Shams, MD as PCP - General (Internal Medicine) Debbe Odea, MD as PCP - Cardiology (Cardiology)  Indicate any recent Medical Services you may have received from other than [facility] providers in the past year (date may be approximate).     Assessment:   This is a routine wellness examination for [place].  Hearing/Vision screen Hearing Screening - Comments:: Denies hearing loss Vision Screening - Comments:: Gets eye exams   Goals Addressed               This Visit's Progress     Patient Stated (pt-stated)        Build up my strength and recover from mesenteric artery stenosis surgery      Depression Screen    05/13/2023    1:00 PM 12/11/2022    11:15  AM 10/05/2022   10:04 AM 03/26/2022   10:06 AM 03/12/2022   11:40 AM 09/19/2021   10:32 AM 03/19/2021    1:53 PM  PHQ 2/9 Scores  PHQ - 2 Score 0 3  0 0 0 0  PHQ- 9 Score 1 9     0  Exception Documentation   Patient refusal        Fall Risk    05/13/2023    1:05 PM 12/11/2022   11:15 AM 10/05/2022   10:04 AM 03/26/2022   10:06 AM 09/19/2021   10:32 AM  Fall Risk   Falls in the past year? 0 0 0 0 0  Number falls in past yr: 0 0 0    Injury with Fall? 0 0 0    Risk for fall due to : No Fall Risks No Fall Risks No Fall Risks No Fall Risks No Fall Risks  Follow up Falls prevention discussed Falls evaluation completed Falls evaluation completed Falls evaluation completed Falls evaluation completed    MEDICARE RISK AT HOME: Medicare Risk at Home Any stairs in or around the home?: No If so, are there any without handrails?: No Home free of loose throw rugs in walkways, pet beds, electrical cords, etc?: Yes Adequate lighting in your home to reduce risk of falls?: Yes Life alert?: No Use of a cane, walker or w/c?: No Grab bars in the bathroom?: Yes Shower chair or bench in shower?: Yes Elevated toilet seat or a handicapped toilet?: Yes  TIMED UP AND GO:  Was the test performed?  No    Cognitive Function:    02/22/2020   11:29 AM 12/16/2017    9:46 AM 12/10/2016   10:46 AM 12/11/2015   10:21 AM  MMSE - Mini Mental State Exam  Not completed: Unable to complete     Orientation to time  5 5 5   Orientation to Place  5 5 5   Registration  3 3 3   Attention/ Calculation  5 5 5   Recall  3 3 3   Language- name 2 objects  2 2 2   Language- repeat  1 1 1   Language- follow 3 step command  3 3 3   Language- read & follow direction  1 1 1   Write a sentence  1 1 1   Copy design  1 1 1   Total score  30 30 30         05/13/2023    1:07 PM 02/21/2019    1:11 PM  6CIT Screen  What Year? 0 points 0 points  What month? 0 points 0 points  What time? 0 points 0 points  Count back from 20 0 points  0 points  Months in reverse 0 points 0 points  Repeat phrase 0 points 0 points  Total Score 0 points 0 points    Immunizations Immunization History  Administered Date(s) Administered   Influenza, High Dose Seasonal PF 08/11/2018   Influenza,inj,Quad PF,6+ Mos 05/24/2013, 04/30/2014   Moderna SARS-COV2 Booster Vaccination 07/01/2020   Moderna Sars-Covid-2 Vaccination 10/02/2019, 10/31/2019   PPD Test 09/18/2014   Pneumococcal Conjugate-13 05/28/2014   Pneumococcal Polysaccharide-23 05/29/2010, 08/18/2018   Tdap 05/28/2010    TDAP status: Due, Education has been provided regarding the importance of this vaccine. Advised may receive this vaccine at local pharmacy or Health Dept. Aware to provide a copy of the vaccination record if obtained from local pharmacy or Health Dept. Verbalized acceptance and understanding.  Flu Vaccine status: Due, Education has been provided regarding  the importance of this vaccine. Advised may receive this vaccine at local pharmacy or Health Dept. Aware to provide a copy of the vaccination record if obtained from local pharmacy or Health Dept. Verbalized acceptance and understanding.  Pneumococcal vaccine status: Up to date  Covid-19 vaccine status: Information provided on how to obtain vaccines.   Qualifies for Shingles Vaccine? Yes   Zostavax completed No   Shingrix Completed?: No.    Education has been provided regarding the importance of this vaccine. Patient has been advised to call insurance company to determine out of pocket expense if they have not yet received this vaccine. Advised may also receive vaccine at local pharmacy or Health Dept. Verbalized acceptance and understanding.  Screening Tests Health Maintenance  Topic Date Due   Lung Cancer Screening  Never done   Zoster Vaccines- Shingrix (1 of 2) Never done   DTaP/Tdap/Td (2 - Td or Tdap) 05/28/2020   DEXA SCAN  03/26/2021   INFLUENZA VACCINE  03/11/2023   FOOT EXAM  03/27/2023    HEMOGLOBIN A1C  04/05/2023   COVID-19 Vaccine (4 - 2023-24 season) 04/11/2023   Diabetic kidney evaluation - Urine ACR  10/06/2023   MAMMOGRAM  01/12/2024   OPHTHALMOLOGY EXAM  03/10/2024   Diabetic kidney evaluation - eGFR measurement  03/18/2024   Medicare Annual Wellness (AWV)  05/12/2024   Pneumonia Vaccine 66+ Years old  Completed   Hepatitis C Screening  Completed   HPV VACCINES  Aged Out   Fecal DNA (Cologuard)  Discontinued    Health Maintenance  Health Maintenance Due  Topic Date Due   Lung Cancer Screening  Never done   Zoster Vaccines- Shingrix (1 of 2) Never done   DTaP/Tdap/Td (2 - Td or Tdap) 05/28/2020   DEXA SCAN  03/26/2021   INFLUENZA VACCINE  03/11/2023   FOOT EXAM  03/27/2023   HEMOGLOBIN A1C  04/05/2023   COVID-19 Vaccine (4 - 2023-24 season) 04/11/2023    Colorectal cancer screening: No longer required.   Mammogram status: Completed 01/12/23. Repeat every year  Bone Density status: Completed 03/26/16. Results reflect: Bone density results: NORMAL. Repeat every 5 years.  Lung Cancer Screening: (Low Dose CT Chest recommended if Age 49-80 years, 20 pack-year currently smoking OR have quit w/in 15years.) does qualify.   Lung Cancer Screening Referral: Need provider order for 1st time screening.  Additional Screening:  Hepatitis C Screening: does not qualify; Completed 01/02/16  Vision Screening: Recommended annual ophthalmology exams for early detection of glaucoma and other disorders of the eye. Is the patient up to date with their annual eye exam?  Yes , last exam 03/11/23  Who is the provider or what is the name of the office in which the patient attends annual eye exams? Dr. Willey Blade If pt is not established with a provider, would they like to be referred to a provider to establish care? No .   Dental Screening: Recommended annual dental exams for proper oral hygiene  Diabetic Foot Exam: Diabetic Foot Exam: Overdue, Pt has been advised about the  importance in completing this exam. Pt is scheduled for diabetic foot exam on 05/21/23 at next OV.  Community Resource Referral / Chronic Care Management: CRR required this visit?  No   CCM required this visit?  No     Plan:     I have personally reviewed and noted the following in the patient's chart:   Medical and social history Use of alcohol, tobacco or illicit drugs  Current  medications and supplements including opioid prescriptions. Patient is not currently taking opioid prescriptions. Functional ability and status Nutritional status Physical activity Advanced directives List of other physicians Hospitalizations, surgeries, and ER visits in previous 12 months Vitals Screenings to include cognitive, depression, and falls Referrals and appointments  In addition, I have reviewed and discussed with patient certain preventive protocols, quality metrics, and best practice recommendations. A written personalized care plan for preventive services as well as general preventive health recommendations were provided to patient.     Tora Kindred, CMA   05/13/2023   After Visit Summary: (MyChart) Due to this being a telephonic visit, the after visit summary with patients personalized plan was offered to patient via MyChart   Nurse Notes:  Patient needs flu and Tdap vaccines. Patient needs order for low dose CT for lung cancer screening (1st time) Patient needs DM foot exam at next OV on 05/21/23 Patient wants to discuss the need for pneumonia, covid and shingles vaccines at next OV Order placed for DEXA scan

## 2023-05-20 DIAGNOSIS — K551 Chronic vascular disorders of intestine: Secondary | ICD-10-CM | POA: Diagnosis not present

## 2023-05-21 ENCOUNTER — Encounter: Payer: Self-pay | Admitting: Internal Medicine

## 2023-05-21 ENCOUNTER — Ambulatory Visit: Payer: No Typology Code available for payment source | Admitting: Internal Medicine

## 2023-05-21 VITALS — BP 170/70 | HR 53 | Temp 97.7°F | Ht 67.0 in | Wt 120.2 lb

## 2023-05-21 DIAGNOSIS — E1169 Type 2 diabetes mellitus with other specified complication: Secondary | ICD-10-CM

## 2023-05-21 DIAGNOSIS — E1159 Type 2 diabetes mellitus with other circulatory complications: Secondary | ICD-10-CM | POA: Diagnosis not present

## 2023-05-21 DIAGNOSIS — E785 Hyperlipidemia, unspecified: Secondary | ICD-10-CM | POA: Diagnosis not present

## 2023-05-21 DIAGNOSIS — E118 Type 2 diabetes mellitus with unspecified complications: Secondary | ICD-10-CM

## 2023-05-21 DIAGNOSIS — I152 Hypertension secondary to endocrine disorders: Secondary | ICD-10-CM

## 2023-05-21 DIAGNOSIS — Z794 Long term (current) use of insulin: Secondary | ICD-10-CM

## 2023-05-21 DIAGNOSIS — E559 Vitamin D deficiency, unspecified: Secondary | ICD-10-CM

## 2023-05-21 DIAGNOSIS — E034 Atrophy of thyroid (acquired): Secondary | ICD-10-CM

## 2023-05-21 DIAGNOSIS — R413 Other amnesia: Secondary | ICD-10-CM | POA: Diagnosis not present

## 2023-05-21 DIAGNOSIS — Z09 Encounter for follow-up examination after completed treatment for conditions other than malignant neoplasm: Secondary | ICD-10-CM

## 2023-05-21 DIAGNOSIS — D62 Acute posthemorrhagic anemia: Secondary | ICD-10-CM

## 2023-05-21 DIAGNOSIS — E1151 Type 2 diabetes mellitus with diabetic peripheral angiopathy without gangrene: Secondary | ICD-10-CM

## 2023-05-21 DIAGNOSIS — E1110 Type 2 diabetes mellitus with ketoacidosis without coma: Secondary | ICD-10-CM

## 2023-05-21 LAB — COMPREHENSIVE METABOLIC PANEL
ALT: 12 U/L (ref 0–35)
AST: 20 U/L (ref 0–37)
Albumin: 4.5 g/dL (ref 3.5–5.2)
Alkaline Phosphatase: 82 U/L (ref 39–117)
BUN: 22 mg/dL (ref 6–23)
CO2: 28 meq/L (ref 19–32)
Calcium: 9.9 mg/dL (ref 8.4–10.5)
Chloride: 103 meq/L (ref 96–112)
Creatinine, Ser: 0.72 mg/dL (ref 0.40–1.20)
GFR: 81.16 mL/min (ref >60.00)
Glucose, Bld: 103 mg/dL — ABNORMAL HIGH (ref 70–99)
Potassium: 3.8 meq/L (ref 3.5–5.1)
Sodium: 140 meq/L (ref 135–145)
Total Bilirubin: 0.8 mg/dL (ref 0.2–1.2)
Total Protein: 6.6 g/dL (ref 6.0–8.3)

## 2023-05-21 LAB — CBC WITH DIFFERENTIAL/PLATELET
Basophils Absolute: 0 10*3/uL (ref 0.0–0.1)
Basophils Relative: 1.1 % (ref 0.0–3.0)
Eosinophils Absolute: 0.1 10*3/uL (ref 0.0–0.7)
Eosinophils Relative: 2.9 % (ref 0.0–5.0)
HCT: 38 % (ref 36.0–46.0)
Hemoglobin: 12.3 g/dL (ref 12.0–15.0)
Lymphocytes Relative: 32.2 % (ref 12.0–46.0)
Lymphs Abs: 1.3 10*3/uL (ref 0.7–4.0)
MCHC: 32.2 g/dL (ref 30.0–36.0)
MCV: 89.5 fL (ref 78.0–100.0)
Monocytes Absolute: 0.4 10*3/uL (ref 0.1–1.0)
Monocytes Relative: 9.9 % (ref 3.0–12.0)
Neutro Abs: 2.3 10*3/uL (ref 1.4–7.7)
Neutrophils Relative %: 53.9 % (ref 43.0–77.0)
Platelets: 196 10*3/uL (ref 150.0–400.0)
RBC: 4.25 Mil/uL (ref 3.87–5.11)
RDW: 15.4 % (ref 11.5–15.5)
WBC: 4.2 10*3/uL (ref 4.0–10.5)

## 2023-05-21 LAB — HEMOGLOBIN A1C: Hgb A1c MFr Bld: 4.5 % — ABNORMAL LOW (ref 4.6–6.5)

## 2023-05-21 LAB — B12 AND FOLATE PANEL
Folate: 23.3 ng/mL (ref >5.9)
Vitamin B-12: 434 pg/mL (ref 211–911)

## 2023-05-21 LAB — TSH: TSH: 0.56 u[IU]/mL (ref 0.35–5.50)

## 2023-05-21 MED ORDER — AMLODIPINE BESYLATE 2.5 MG PO TABS: 2.5000 mg | ORAL_TABLET | Freq: Every day | ORAL | 1 refills | Status: DC

## 2023-05-21 MED ORDER — ATORVASTATIN CALCIUM 20 MG PO TABS: 20.0000 mg | ORAL_TABLET | Freq: Every day | ORAL | 3 refills | Status: DC

## 2023-05-21 NOTE — Progress Notes (Addendum)
Subjective:  Patient ID: Alexandra Copeland, female    DOB: [birth year]  Age: 77 y.o. MRN: [id number]  CC: The primary encounter diagnosis was Memory loss. Diagnoses of Hypothyroidism due to acquired atrophy of thyroid, Controlled type 2 diabetes mellitus with diabetic peripheral angiopathy without gangrene, with long-term current use of insulin (HCC), Vitamin D deficiency, Hyperlipidemia associated with type 2 diabetes mellitus (HCC), Anemia associated with acute blood loss, Hypertension associated with type 2 diabetes mellitus [facility]), Hospital discharge follow-up, and Controlled type 2 diabetes mellitus with complication, without long-term current use of insulin (HCC) were also pertinent to this visit.   HPI Alexandra Copeland presents for  Chief Complaint  Patient presents with   Hospitalization Follow-up   Alexandra Copeland is a 77 y.o. female with PMHx of HTN, HLD, hypothyroidism and chronic mesenteric ischemia s/p SMA stent on 01/26/23 who presented with ongoing abdominal pain, nausea and vomiting that has not improved since her SMA stent. Admitted to [facility] for  supraceliac aorto-celiac bypass which was done on 8/19. She was transferred to  SICU on 8/19/ started on clevidipine drip 2-106mL/hr, titrated to goal SBP < 140. Clevidipine paused for severe hyotension unresponsive to fluid bolus,  required pressor support  multiple , bradycardia,  and developed shjock liver and likely sock to BM which resulted in thrombocytopenia;  hematology consulted HIP panel negative  . Discharged to SNF for rehab and spent 9 days,  discharged home 2 weeks ago.  NEVER RECEIVED HOME HEALTH.  HER 3 CHILDREN HAVE BEEN COOKING FOR HER AND SUPPORTING HER . DAUGHTERS ARE IN [place](SHE IS AN RN) AND Alexandra Copeland  20 lb wt loss since May because of pain and vomiting. Has regained appetite.  Eats a variety of good food without pain or  vomiting  and supplementing diet with high protein shakes     Outpatient Medications Prior to  Visit  Medication Sig Dispense Refill   aspirin EC 81 MG tablet Take 1 tablet (81 mg total) by mouth daily. Swallow whole. 150 tablet 1   clopidogrel (PLAVIX) 75 MG tablet Take 1 tablet (75 mg total) by mouth daily. 30 tablet 5   fluticasone (FLONASE) 50 MCG/ACT nasal spray Place 2 sprays into both nostrils daily. 16 g 6   hydrochlorothiazide (HYDRODIURIL) 25 MG tablet Take 25 mg by mouth daily.     levothyroxine (SYNTHROID) 88 MCG tablet TAKE 1 TABLET BY MOUTH ONCE DAILY BEFORE BREAKFAST 90 tablet 0   losartan (COZAAR) 100 MG tablet Take 1 tablet (100 mg total) by mouth daily. 90 tablet 3   Multiple Vitamin (MULTIVITAMIN) tablet Take 1 tablet by mouth daily.     ondansetron (ZOFRAN-ODT) 4 MG disintegrating tablet Take 4 mg by mouth every 8 (eight) hours as needed for nausea.     pantoprazole (PROTONIX) 40 MG tablet Take 1 tablet (40 mg total) by mouth daily. 30 tablet 11   polyethylene glycol (MIRALAX / GLYCOLAX) 17 g packet Take 17 g by mouth daily.     triamcinolone cream (KENALOG) 0.1 % Apply 1 application topically 2 (two) times daily. (Patient taking differently: Apply 1 application  topically as needed.) 80 g 0   amLODipine (NORVASC) 2.5 MG tablet Take 1 tablet (2.5 mg total) by mouth daily. 90 tablet 1   atorvastatin (LIPITOR) 20 MG tablet Take 1 tablet (20 mg total) by mouth daily. 90 tablet 3   diazepam (VALIUM) 5 MG tablet Take 1 tablet (5 mg total) by mouth every 12 (twelve) hours as  needed for anxiety (vertigo). (Patient not taking: Reported on 05/13/2023) 20 tablet 0   dronabinol (MARINOL) 2.5 MG capsule Take 2.5 mg by mouth daily. (Patient not taking: Reported on 05/21/2023)     No facility-administered medications prior to visit.    Review of Systems;  Patient denies headache, fevers, malaise, unintentional weight loss, skin rash, eye pain, sinus congestion and sinus pain, sore throat, dysphagia,  hemoptysis , cough, dyspnea, wheezing, chest pain, palpitations, orthopnea, edema,  abdominal pain, nausea, melena, diarrhea, constipation, flank pain, dysuria, hematuria, urinary  Frequency, nocturia, numbness, tingling, seizures,  Focal weakness, Loss of consciousness,  Tremor, insomnia, depression, anxiety, and suicidal ideation.      Objective:  BP (!) 170/70   Pulse (!) 53   Temp 97.7 F (36.5 C) (Oral)   Ht 5\' 7"  (1.702 m)   Wt 120 lb 3.2 oz (54.5 kg)   SpO2 98%   BMI 18.83 kg/m   BP Readings from Last 3 Encounters:  05/21/23 (!) 170/70  03/18/23 99/62  03/15/23 (!) 84/56    Wt Readings from Last 3 Encounters:  05/21/23 120 lb 3.2 oz (54.5 kg)  05/13/23 120 lb (54.4 kg)  03/18/23 115 lb 9.6 oz (52.4 kg)    Physical Exam Vitals reviewed.  Constitutional:      General: She is not in acute distress.    Appearance: Normal appearance. She is normal weight. She is not ill-appearing, toxic-appearing or diaphoretic.  HENT:     Head: Normocephalic.  Eyes:     General: No scleral icterus.       Right eye: No discharge.        Left eye: No discharge.     Conjunctiva/sclera: Conjunctivae normal.  Cardiovascular:     Rate and Rhythm: Normal rate and regular rhythm.     Heart sounds: Normal heart sounds.  Pulmonary:     Effort: Pulmonary effort is normal. No respiratory distress.     Breath sounds: Normal breath sounds.  Musculoskeletal:        General: Normal range of motion.  Skin:    General: Skin is warm and dry.  Neurological:     General: No focal deficit present.     Mental Status: She is alert and oriented to person, place, and time. Mental status is at baseline.  Psychiatric:        Mood and Affect: Mood normal.        Behavior: Behavior normal.        Thought Content: Thought content normal.        Judgment: Judgment normal.    Lab Results  Component Value Date   HGBA1C 4.5 (L) 05/21/2023   HGBA1C 5.8 10/05/2022   HGBA1C 6.1 03/26/2022    Lab Results  Component Value Date   CREATININE 0.72 05/21/2023   CREATININE 0.85 03/19/2023    CREATININE 0.59 01/26/2023    Lab Results  Component Value Date   WBC 4.2 05/21/2023   HGB 12.3 05/21/2023   HCT 38.0 05/21/2023   PLT 196.0 05/21/2023   GLUCOSE 103 (H) 05/21/2023   CHOL 155 05/21/2023   TRIG 97 05/21/2023   HDL 55 05/21/2023   LDLDIRECT 91.0 10/05/2022   LDLCALC 81 05/21/2023   ALT 12 05/21/2023   AST 20 05/21/2023   NA 140 05/21/2023   K 3.8 05/21/2023   CL 103 05/21/2023   CREATININE 0.72 05/21/2023   BUN 22 05/21/2023   CO2 28 05/21/2023   TSH 0.56 05/21/2023  INR 1.1 12/29/2022   HGBA1C 4.5 (L) 05/21/2023   MICROALBUR 1.1 10/05/2022    PERIPHERAL VASCULAR CATHETERIZATION  Result Date: 01/26/2023 See surgical note for result.   Assessment & Plan:  .Memory loss -     B12 and Folate Panel  Hypothyroidism due to acquired atrophy of thyroid Assessment & Plan: Thyroid function is WNL on 88 mcg levothyroxine  No current changes needed.   Lab Results  Component Value Date   TSH 0.56 05/21/2023     Orders: -     TSH  Controlled type 2 diabetes mellitus with diabetic peripheral angiopathy without gangrene, with long-term current use of insulin (HCC) -     Comprehensive metabolic panel -     Hemoglobin A1c  Vitamin D deficiency  Hyperlipidemia associated with type 2 diabetes mellitus (HCC) Assessment & Plan: Tolerating atorvastatin   but LDL is not at goal of < 70 given h/o CVA . INCREASE ATORVASTATIN TO 40 MG Daily  Lab Results  Component Value Date   CHOL 155 05/21/2023   HDL 55 05/21/2023   LDLCALC 81 05/21/2023   LDLDIRECT 91.0 10/05/2022   TRIG 97 05/21/2023   CHOLHDL 2.8 05/21/2023     Orders: -     Lipid Panel w/reflex Direct LDL  Anemia associated with acute blood loss Assessment & Plan: Resolved  by repeat labs.  Iron studies are normal as well.    Orders: -     CBC with Differential/Platelet -     Iron, TIBC and Ferritin Panel  Hypertension associated with type 2 diabetes mellitus (HCC) Assessment &  Plan: Elevated today.  Will resume  AMLODIPINE 2.5 MG DAILY TO CURRENT REGIMEN OF LOSARTAN AND HCTZ    Hospital discharge follow-up Assessment & Plan: Patient is stable post discharge and has no new issues or questions about discharge plans at the visit today for hospital follow up. All labs , imaging studies and progress notes from admission were reviewed with patient today      Controlled type 2 diabetes mellitus with complication, without long-term current use of insulin (HCC) Assessment & Plan: well-controlled on diet alone.. Patient  Has had  urine microalbumin to creatinine ratio done within the past year. Patient is tolerating statin therapy for CAD risk reduction and on ACE/ARB for reduction in proteinuria.    Lab Results  Component Value Date   HGBA1C 4.5 (L) 05/21/2023   Lab Results  Component Value Date   MICROALBUR 1.1 10/05/2022   MICROALBUR <0.7 09/19/2021        Other orders -     amLODIPine Besylate; Take 1 tablet (2.5 mg total) by mouth daily.  Dispense: 90 tablet; Refill: 1 -     Atorvastatin Calcium; Take 1 tablet (40 mg total) by mouth daily.  Dispense: 901 tablet; Refill: 1    Follow-up: No follow-ups on file.   Sherlene Shams, MD

## 2023-05-21 NOTE — Patient Instructions (Signed)
I WOULD GET THE FLU VACCINE BUT EVERYHING ELSE CAN BE POSTPONED

## 2023-05-22 LAB — LIPID PANEL W/REFLEX DIRECT LDL
Cholesterol: 155 mg/dL (ref <200)
HDL: 55 mg/dL (ref >=50)
LDL Cholesterol (Calc): 81 mg/dL
Non-HDL Cholesterol (Calc): 100 mg/dL (ref <130)
Total CHOL/HDL Ratio: 2.8 (calc) (ref <5.0)
Triglycerides: 97 mg/dL (ref <150)

## 2023-05-22 LAB — IRON,TIBC AND FERRITIN PANEL
%SAT: 15 % — ABNORMAL LOW (ref 16–45)
Ferritin: 42 ng/mL (ref 16–288)
Iron: 51 ug/dL (ref 45–160)
TIBC: 350 ug/dL (ref 250–450)

## 2023-05-23 DIAGNOSIS — E118 Type 2 diabetes mellitus with unspecified complications: Secondary | ICD-10-CM | POA: Insufficient documentation

## 2023-05-23 DIAGNOSIS — D62 Acute posthemorrhagic anemia: Secondary | ICD-10-CM

## 2023-05-23 HISTORY — DX: Acute posthemorrhagic anemia: D62

## 2023-05-23 MED ORDER — ATORVASTATIN CALCIUM 40 MG PO TABS: 40.0000 mg | ORAL_TABLET | Freq: Every day | ORAL | 1 refills | Status: DC

## 2023-05-23 NOTE — Assessment & Plan Note (Addendum)
Elevated today.  Will resume  AMLODIPINE 2.5 MG DAILY TO CURRENT REGIMEN OF LOSARTAN AND HCTZ

## 2023-05-23 NOTE — Assessment & Plan Note (Signed)
Patient is stable post discharge and has no new issues or questions about discharge plans at the visit today for hospital follow up. All labs , imaging studies and progress notes from admission were reviewed with patient today

## 2023-05-23 NOTE — Assessment & Plan Note (Signed)
well-controlled on diet alone.. Patient  Has had  urine microalbumin to creatinine ratio done within the past year. Patient is tolerating statin therapy for CAD risk reduction and on ACE/ARB for reduction in proteinuria.    Lab Results  Component Value Date   HGBA1C 4.5 (L) 05/21/2023   Lab Results  Component Value Date   MICROALBUR 1.1 10/05/2022   MICROALBUR <0.7 09/19/2021

## 2023-05-23 NOTE — Assessment & Plan Note (Signed)
Thyroid function is WNL on 88 mcg levothyroxine  No current changes needed.   Lab Results  Component Value Date   TSH 0.56 05/21/2023

## 2023-05-23 NOTE — Assessment & Plan Note (Signed)
Resolved  by repeat labs.  Iron studies are normal as well.

## 2023-05-23 NOTE — Assessment & Plan Note (Addendum)
Tolerating atorvastatin   but LDL is not at goal of < 70 given h/o CVA . INCREASE ATORVASTATIN TO 40 MG Daily  Lab Results  Component Value Date   CHOL 155 05/21/2023   HDL 55 05/21/2023   LDLCALC 81 05/21/2023   LDLDIRECT 91.0 10/05/2022   TRIG 97 05/21/2023   CHOLHDL 2.8 05/21/2023

## 2023-05-29 ENCOUNTER — Other Ambulatory Visit: Payer: Self-pay | Admitting: Internal Medicine

## 2023-06-01 ENCOUNTER — Telehealth: Payer: Self-pay | Admitting: Physician Assistant

## 2023-06-01 NOTE — Telephone Encounter (Signed)
Patient called in to cancel her appointment and her problem already been solved.

## 2023-06-08 ENCOUNTER — Ambulatory Visit: Payer: No Typology Code available for payment source | Admitting: Physician Assistant

## 2023-06-08 ENCOUNTER — Ambulatory Visit: Payer: No Typology Code available for payment source | Admitting: Gastroenterology

## 2023-06-14 ENCOUNTER — Other Ambulatory Visit (INDEPENDENT_AMBULATORY_CARE_PROVIDER_SITE_OTHER): Payer: Self-pay | Admitting: Nurse Practitioner

## 2023-07-12 ENCOUNTER — Other Ambulatory Visit: Payer: Self-pay | Admitting: Internal Medicine

## 2023-07-12 DIAGNOSIS — E034 Atrophy of thyroid (acquired): Secondary | ICD-10-CM

## 2023-07-14 MED ORDER — LEVOTHYROXINE SODIUM 88 MCG PO TABS: 88.0000 ug | ORAL_TABLET | Freq: Every day | ORAL | 0 refills | Status: DC

## 2023-07-15 MED ORDER — HYDROCHLOROTHIAZIDE 25 MG PO TABS: 25.0000 mg | ORAL_TABLET | Freq: Every day | ORAL | 3 refills | Status: DC

## 2023-08-23 ENCOUNTER — Ambulatory Visit (INDEPENDENT_AMBULATORY_CARE_PROVIDER_SITE_OTHER): Payer: Medicare HMO | Admitting: Internal Medicine

## 2023-08-23 ENCOUNTER — Encounter: Payer: Self-pay | Admitting: Internal Medicine

## 2023-08-23 VITALS — BP 126/68 | HR 57 | Ht 67.0 in | Wt 125.8 lb

## 2023-08-23 DIAGNOSIS — E034 Atrophy of thyroid (acquired): Secondary | ICD-10-CM

## 2023-08-23 DIAGNOSIS — I152 Hypertension secondary to endocrine disorders: Secondary | ICD-10-CM

## 2023-08-23 DIAGNOSIS — E279 Disorder of adrenal gland, unspecified: Secondary | ICD-10-CM | POA: Diagnosis not present

## 2023-08-23 DIAGNOSIS — E118 Type 2 diabetes mellitus with unspecified complications: Secondary | ICD-10-CM

## 2023-08-23 DIAGNOSIS — E1159 Type 2 diabetes mellitus with other circulatory complications: Secondary | ICD-10-CM

## 2023-08-23 DIAGNOSIS — R928 Other abnormal and inconclusive findings on diagnostic imaging of breast: Secondary | ICD-10-CM | POA: Diagnosis not present

## 2023-08-23 DIAGNOSIS — R634 Abnormal weight loss: Secondary | ICD-10-CM | POA: Diagnosis not present

## 2023-08-23 DIAGNOSIS — D582 Other hemoglobinopathies: Secondary | ICD-10-CM | POA: Diagnosis not present

## 2023-08-23 DIAGNOSIS — E785 Hyperlipidemia, unspecified: Secondary | ICD-10-CM | POA: Diagnosis not present

## 2023-08-23 DIAGNOSIS — D751 Secondary polycythemia: Secondary | ICD-10-CM

## 2023-08-23 DIAGNOSIS — K551 Chronic vascular disorders of intestine: Secondary | ICD-10-CM | POA: Diagnosis not present

## 2023-08-23 DIAGNOSIS — E1169 Type 2 diabetes mellitus with other specified complication: Secondary | ICD-10-CM | POA: Diagnosis not present

## 2023-08-23 LAB — LIPID PANEL
Cholesterol: 170 mg/dL (ref 0–200)
HDL: 64.3 mg/dL (ref >39.00)
LDL Cholesterol: 80 mg/dL (ref 0–99)
NonHDL: 105.36
Total CHOL/HDL Ratio: 3
Triglycerides: 127 mg/dL (ref 0.0–149.0)
VLDL: 25.4 mg/dL (ref 0.0–40.0)

## 2023-08-23 LAB — CBC WITH DIFFERENTIAL/PLATELET
Basophils Absolute: 0.1 10*3/uL (ref 0.0–0.1)
Basophils Relative: 1 % (ref 0.0–3.0)
Eosinophils Absolute: 0.1 10*3/uL (ref 0.0–0.7)
Eosinophils Relative: 1.5 % (ref 0.0–5.0)
HCT: 43.5 % (ref 36.0–46.0)
Hemoglobin: 14.4 g/dL (ref 12.0–15.0)
Lymphocytes Relative: 18.7 % (ref 12.0–46.0)
Lymphs Abs: 1 10*3/uL (ref 0.7–4.0)
MCHC: 33.1 g/dL (ref 30.0–36.0)
MCV: 88 fL (ref 78.0–100.0)
Monocytes Absolute: 0.5 10*3/uL (ref 0.1–1.0)
Monocytes Relative: 9.3 % (ref 3.0–12.0)
Neutro Abs: 3.7 10*3/uL (ref 1.4–7.7)
Neutrophils Relative %: 69.5 % (ref 43.0–77.0)
Platelets: 212 10*3/uL (ref 150.0–400.0)
RBC: 4.94 Mil/uL (ref 3.87–5.11)
RDW: 15.1 % (ref 11.5–15.5)
WBC: 5.3 10*3/uL (ref 4.0–10.5)

## 2023-08-23 LAB — COMPREHENSIVE METABOLIC PANEL
ALT: 13 U/L (ref 0–35)
AST: 20 U/L (ref 0–37)
Albumin: 4.6 g/dL (ref 3.5–5.2)
Alkaline Phosphatase: 80 U/L (ref 39–117)
BUN: 13 mg/dL (ref 6–23)
CO2: 30 meq/L (ref 19–32)
Calcium: 9.4 mg/dL (ref 8.4–10.5)
Chloride: 103 meq/L (ref 96–112)
Creatinine, Ser: 0.76 mg/dL (ref 0.40–1.20)
GFR: 75.92 mL/min (ref >60.00)
Glucose, Bld: 76 mg/dL (ref 70–99)
Potassium: 3.6 meq/L (ref 3.5–5.1)
Sodium: 141 meq/L (ref 135–145)
Total Bilirubin: 0.5 mg/dL (ref 0.2–1.2)
Total Protein: 7 g/dL (ref 6.0–8.3)

## 2023-08-23 LAB — MICROALBUMIN / CREATININE URINE RATIO
Creatinine,U: 14.3 mg/dL
Microalb Creat Ratio: 4.9 mg/g (ref 0.0–30.0)
Microalb, Ur: <0.7 mg/dL (ref 0.0–1.9)

## 2023-08-23 LAB — LDL CHOLESTEROL, DIRECT: Direct LDL: 80 mg/dL

## 2023-08-23 LAB — HEMOGLOBIN A1C: Hgb A1c MFr Bld: 6 % (ref 4.6–6.5)

## 2023-08-23 MED ORDER — AMOXICILLIN 500 MG PO TABS: ORAL_TABLET | ORAL | 2 refills | Status: DC

## 2023-08-23 MED ORDER — PANTOPRAZOLE SODIUM 40 MG PO TBEC: 40.0000 mg | DELAYED_RELEASE_TABLET | Freq: Every day | ORAL | 1 refills | Status: DC

## 2023-08-23 MED ORDER — LEVOTHYROXINE SODIUM 88 MCG PO TABS
88.0000 ug | ORAL_TABLET | Freq: Every day | ORAL | 1 refills | Status: DC
Start: 2023-08-23 — End: 2024-02-28

## 2023-08-23 MED ORDER — CLOPIDOGREL BISULFATE 75 MG PO TABS: 75.0000 mg | ORAL_TABLET | Freq: Every day | ORAL | 1 refills | Status: DC

## 2023-08-23 NOTE — Assessment & Plan Note (Signed)
 Has not been taking losartan due to home readings being < 130/80.  She will resume for BP > 140/80 or proteinuria by urinalysis ordered today  Lab Results  Component Value Date   MICROALBUR 1.1 10/05/2022   MICROALBUR <0.7 09/19/2021

## 2023-08-23 NOTE — Assessment & Plan Note (Addendum)
 Resolved s/p  smoking cessation in April 2024 .  Lab Results  Component Value Date   WBC 5.3 08/23/2023   HGB 14.4 08/23/2023   HCT 43.5 08/23/2023   MCV 88.0 08/23/2023   PLT 212.0 08/23/2023

## 2023-08-23 NOTE — Progress Notes (Signed)
 Subjective:  Patient ID: Alexandra Copeland, female    DOB: 03-02-1946  Age: 78 y.o. MRN: [id number]  CC: The primary encounter diagnosis was Chronic mesenteric ischemia (HCC). Diagnoses of Hypothyroidism due to acquired atrophy of thyroid , Controlled type 2 diabetes mellitus with complication, without long-term current use of insulin (HCC), Hyperlipidemia associated with type 2 diabetes mellitus (HCC), Hypertension associated with type 2 diabetes mellitus (HCC), Erythrocytosis, Elevated hemoglobin (HCC), Abnormal mammogram of left breast, and Adrenal nodule (HCC) were also pertinent to this visit.   HPI Alexandra Copeland presents for  Chief Complaint  Patient presents with   Medical Management of Chronic Issues    3 month follow up    Alexandra Copeland was admitted to [facility] for  supraceliac aorto-celiac bypass which was done on 8/19  after a failed procedure in June 2024,.  Second surgery  was complicated by shock which affected liver and BM. Alexandra Copeland was eventually discharged to SNF for rehab and spent 9 days before being discharged to home without home health and had an unintentional 20 lb wt loss since May because of pain and vomiting. Has regained appetite and presents today with a 5 lb weight gain since October, her last visit   She feels generally well,  missing her family  who came for the holidays and decorated her home at Thanksgiving . Grateful to be alive after such a horrific year.     She has shared with me a photo of herself 1 day post op ICU at [facility] , walking with a walker  with Foley,  3 IV,s  NG tube.    Urge incontinence improving since surgery .  No prior check for UTI.  Using a light pad,   Stools have become less hard;  originally bothered her hemorrhoids,  now  no longer and BMS are softer using colace.  Her Energy level is increasing although it took a week to get the Christmas tree down.    She has regained a few lbs and want to start exercising    Prophylactic abx prior to dental  procedure is recommended by Surgeon.  Amox 2000 mg sent   Had a wonerful  experience at [facility],  the nursing staff treated her with respect .     Outpatient Medications Prior to Visit  Medication Sig Dispense Refill   amLODipine  (NORVASC ) 2.5 MG tablet Take 1 tablet (2.5 mg total) by mouth daily. 90 tablet 1   aspirin  EC 81 MG tablet Take 1 tablet (81 mg total) by mouth daily. Swallow whole. 150 tablet 1   atorvastatin  (LIPITOR) 40 MG tablet Take 1 tablet (40 mg total) by mouth daily. 901 tablet 1   fluticasone  (FLONASE ) 50 MCG/ACT nasal spray Place 2 sprays into both nostrils daily. 16 g 6   hydrochlorothiazide  (HYDRODIURIL ) 25 MG tablet Take 1 tablet (25 mg total) by mouth daily. 90 tablet 3   Multiple Vitamin (MULTIVITAMIN) tablet Take 1 tablet by mouth daily.     ondansetron  (ZOFRAN -ODT) 4 MG disintegrating tablet Take 4 mg by mouth every 8 (eight) hours as needed for nausea.     polyethylene glycol (MIRALAX / GLYCOLAX) 17 g packet Take 17 g by mouth daily.     clopidogrel  (PLAVIX ) 75 MG tablet Take 1 tablet (75 mg total) by mouth daily. 30 tablet 5   levothyroxine  (SYNTHROID ) 88 MCG tablet Take 1 tablet (88 mcg total) by mouth daily before breakfast. 90 tablet 0   pantoprazole  (PROTONIX ) 40 MG tablet Take 1  tablet (40 mg total) by mouth daily. 30 tablet 11   losartan  (COZAAR ) 100 MG tablet Take 1 tablet by mouth once daily (Patient not taking: Reported on 08/23/2023) 90 tablet 1   triamcinolone  cream (KENALOG ) 0.1 % Apply 1 application topically 2 (two) times daily. (Patient taking differently: Apply 1 application  topically as needed.) 80 g 0   No facility-administered medications prior to visit.    Review of Systems;  Patient denies headache, fevers, malaise, unintentional weight loss, skin rash, eye pain, sinus congestion and sinus pain, sore throat, dysphagia,  hemoptysis , cough, dyspnea, wheezing, chest pain, palpitations, orthopnea, edema, abdominal pain, nausea, melena, diarrhea,  constipation, flank pain, dysuria, hematuria, urinary  Frequency, nocturia, numbness, tingling, seizures,  Focal weakness, Loss of consciousness,  Tremor, insomnia, depression, anxiety, and suicidal ideation.      Objective:  BP 126/68   Pulse (!) 57   Ht 5' 7 (1.702 m)   Wt 125 lb 12.8 oz (57.1 kg)   SpO2 99%   BMI 19.70 kg/m   BP Readings from Last 3 Encounters:  08/23/23 126/68  05/21/23 (!) 170/70  03/18/23 99/62    Wt Readings from Last 3 Encounters:  08/23/23 125 lb 12.8 oz (57.1 kg)  05/21/23 120 lb 3.2 oz (54.5 kg)  05/13/23 120 lb (54.4 kg)    Physical Exam Vitals reviewed.  Constitutional:      General: She is not in acute distress.    Appearance: Normal appearance. She is normal weight. She is not ill-appearing, toxic-appearing or diaphoretic.  HENT:     Head: Normocephalic.  Eyes:     General: No scleral icterus.       Right eye: No discharge.        Left eye: No discharge.     Conjunctiva/sclera: Conjunctivae normal.  Cardiovascular:     Rate and Rhythm: Normal rate and regular rhythm.     Heart sounds: Normal heart sounds.  Pulmonary:     Effort: Pulmonary effort is normal. No respiratory distress.     Breath sounds: Normal breath sounds.  Musculoskeletal:        General: Normal range of motion.  Skin:    General: Skin is warm and dry.  Neurological:     General: No focal deficit present.     Mental Status: She is alert and oriented to person, place, and time. Mental status is at baseline.  Psychiatric:        Mood and Affect: Mood normal.        Behavior: Behavior normal.        Thought Content: Thought content normal.        Judgment: Judgment normal.   Lab Results  Component Value Date   HGBA1C 6.0 08/23/2023   HGBA1C 4.5 (L) 05/21/2023   HGBA1C 5.8 10/05/2022    Lab Results  Component Value Date   CREATININE 0.76 08/23/2023   CREATININE 0.72 05/21/2023   CREATININE 0.85 03/19/2023    Lab Results  Component Value Date   WBC 5.3  08/23/2023   HGB 14.4 08/23/2023   HCT 43.5 08/23/2023   PLT 212.0 08/23/2023   GLUCOSE 76 08/23/2023   CHOL 170 08/23/2023   TRIG 127.0 08/23/2023   HDL 64.30 08/23/2023   LDLDIRECT 80.0 08/23/2023   LDLCALC 80 08/23/2023   ALT 13 08/23/2023   AST 20 08/23/2023   NA 141 08/23/2023   K 3.6 08/23/2023   CL 103 08/23/2023   CREATININE 0.76 08/23/2023  BUN 13 08/23/2023   CO2 30 08/23/2023   TSH 0.56 05/21/2023   INR 1.1 12/29/2022   HGBA1C 6.0 08/23/2023   MICROALBUR <0.7 08/23/2023    PERIPHERAL VASCULAR CATHETERIZATION Result Date: 01/26/2023 See surgical note for result.   Assessment & Plan:  .Chronic mesenteric ischemia (HCC) Assessment & Plan: /p supraceliac aorta-celiac bypass with a 6 mm Dacron graft. In August  2024 at [facility], after angioplasty attempts at [facility] were unsuccessful .   Her October 2024 post operative ultrasound reveals a widely patent bypass and she is gaining weight    Hypothyroidism due to acquired atrophy of thyroid  -     Levothyroxine  Sodium; Take 1 tablet (88 mcg total) by mouth daily before breakfast.  Dispense: 90 tablet; Refill: 1  Controlled type 2 diabetes mellitus with complication, without long-term current use of insulin (HCC) -     Hemoglobin A1c -     Comprehensive metabolic panel -     Microalbumin / creatinine urine ratio  Hyperlipidemia associated with type 2 diabetes mellitus (HCC) -     Lipid panel -     LDL cholesterol, direct  Hypertension associated with type 2 diabetes mellitus (HCC) Assessment & Plan: Has not been taking losartan  due to home readings being < 130/80.  She will resume for BP > 140/80 or proteinuria by urinalysis ordered today  Lab Results  Component Value Date   MICROALBUR 1.1 10/05/2022   MICROALBUR <0.7 09/19/2021        Erythrocytosis -     CBC with Differential/Platelet  Elevated hemoglobin (HCC) Assessment & Plan: Persistent despite smoking cessation in April 2024 . iF still elevated will  refer to hematology   Abnormal mammogram of left breast Assessment & Plan: Additional images of left breast were ordered in June but deferred due to patient's abdominal surgery.  I have encouraged her to have these done   Adrenal nodule [facility]) Assessment & Plan: MRI  was done in June to evaluate left adrenal nodule.  Benign appeairng.  No further workup   Other orders -     Pantoprazole  Sodium; Take 1 tablet (40 mg total) by mouth daily.  Dispense: 90 tablet; Refill: 1 -     Clopidogrel  Bisulfate; Take 1 tablet (75 mg total) by mouth daily.  Dispense: 90 tablet; Refill: 1 -     Amoxicillin ; 4 tablets by mouth one hour prior to procedure  Dispense: 4 tablet; Refill: 2     I provided  41 minutes of face-to-face time during this encounter reviewing patient's last visit with me, patient's  most recent visit with Vascular surgery ,   recent surgical and non surgical procedures, previous  labs and imaging studies, counseling on currently addressed issues,  and post visit ordering to diagnostics and therapeutics .   Follow-up: Return in about 6 months (around 02/20/2024).   Alexandra LITTIE Kettering, MD

## 2023-08-23 NOTE — Patient Instructions (Addendum)
 I'm so sorry about losing both of your dogs.   I am convinced that you will see them again.   Resume exercise at 50% until you gain your strength  You can try adding a Hair , skin and nails supplement to help your hair regrowth, along with  a One a Day  MVI  I have sent the refills for pavix and protonix     Do not resume losartan  .  We will address once your home readings are  equal to or  > 140/90,  OR if you have protein in your urine. (protein in the urine can mean that diabetes and/or hypertension is starting to affect your kidney's ability to  filter protein out of the urine )  .

## 2023-08-24 ENCOUNTER — Encounter: Payer: Self-pay | Admitting: Internal Medicine

## 2023-08-24 NOTE — Assessment & Plan Note (Addendum)
/  p supraceliac aorta-celiac bypass with a 6 mm Dacron graft. In August  2024 at [facility], after angioplasty attempts at [facility] were unsuccessful .   Her October 2024 post operative ultrasound reveals a widely patent bypass and she is gaining weight

## 2023-08-24 NOTE — Assessment & Plan Note (Signed)
 She lost an excessive amount of  weight following the [employer]  29 lbs, and again during 2024 due to chronoic mesenteric ischemia which was treated surgically  she has regained 5 lbs and is at a healty weight for age

## 2023-08-24 NOTE — Assessment & Plan Note (Signed)
Thyroid function is WNL on 88 mcg levothyroxine  No current changes needed.   Lab Results  Component Value Date   TSH 0.56 05/21/2023

## 2023-08-24 NOTE — Assessment & Plan Note (Signed)
 Additional images of left breast were ordered in June but deferred due to patient's abdominal surgery.  I have encouraged her to have these done

## 2023-08-24 NOTE — Assessment & Plan Note (Signed)
 MRI  was done in June to evaluate left adrenal nodule.  Benign appeairng.  No further workup

## 2023-08-24 NOTE — Assessment & Plan Note (Signed)
 well-controlled on diet alone.. Patient  Has had  urine microalbumin to creatinine ratio done within the past year. Patient is tolerating statin therapy for CAD risk reduction and on ACE/ARB for reduction in proteinuria.    Lab Results  Component Value Date   HGBA1C 6.0 08/23/2023   Lab Results  Component Value Date   MICROALBUR <0.7 08/23/2023   MICROALBUR 1.1 10/05/2022

## 2023-08-29 ENCOUNTER — Other Ambulatory Visit: Payer: Self-pay | Admitting: Internal Medicine

## 2023-09-10 ENCOUNTER — Ambulatory Visit
Admission: RE | Admit: 2023-09-10 | Discharge: 2023-09-10 | Disposition: A | Discharge status: Home / Self Care | Payer: Medicare HMO | Source: Ambulatory Visit | Attending: Internal Medicine | Admitting: Internal Medicine

## 2023-09-10 ENCOUNTER — Other Ambulatory Visit: Payer: Self-pay | Admitting: Internal Medicine

## 2023-09-10 DIAGNOSIS — N6489 Other specified disorders of breast: Secondary | ICD-10-CM | POA: Insufficient documentation

## 2023-09-10 DIAGNOSIS — R92322 Mammographic fibroglandular density, left breast: Secondary | ICD-10-CM | POA: Diagnosis not present

## 2023-09-10 DIAGNOSIS — R928 Other abnormal and inconclusive findings on diagnostic imaging of breast: Secondary | ICD-10-CM | POA: Insufficient documentation

## 2023-09-12 DIAGNOSIS — N6320 Unspecified lump in the left breast, unspecified quadrant: Secondary | ICD-10-CM | POA: Insufficient documentation

## 2023-09-12 NOTE — Assessment & Plan Note (Signed)
Ultrasound guided biopsy as been ordered by radiology

## 2023-09-16 ENCOUNTER — Ambulatory Visit
Admission: RE | Admit: 2023-09-16 | Discharge: 2023-09-16 | Disposition: A | Discharge status: Home / Self Care | Payer: Medicare HMO | Source: Ambulatory Visit | Attending: Internal Medicine | Admitting: Internal Medicine

## 2023-09-16 DIAGNOSIS — R928 Other abnormal and inconclusive findings on diagnostic imaging of breast: Secondary | ICD-10-CM | POA: Insufficient documentation

## 2023-09-16 DIAGNOSIS — N6489 Other specified disorders of breast: Secondary | ICD-10-CM | POA: Insufficient documentation

## 2023-09-16 HISTORY — PX: BREAST BIOPSY: SHX20

## 2023-09-16 MED ORDER — LIDOCAINE 1 % OPTIME INJ - NO CHARGE
2.0000 mL | Freq: Once | INTRAMUSCULAR | Status: AC
Start: 2023-09-16 — End: 2023-09-16
  Administered 2023-09-16: 2 mL
  Filled 2023-09-16: qty 2

## 2023-09-16 MED ORDER — LIDOCAINE-EPINEPHRINE 1 %-1:100000 IJ SOLN
8.0000 mL | Freq: Once | INTRAMUSCULAR | Status: AC
Start: 2023-09-16 — End: 2023-09-16
  Administered 2023-09-16: 8 mL
  Filled 2023-09-16: qty 8

## 2023-09-17 LAB — SURGICAL PATHOLOGY

## 2023-09-19 ENCOUNTER — Encounter: Payer: Self-pay | Admitting: Internal Medicine

## 2023-11-18 DIAGNOSIS — I6523 Occlusion and stenosis of bilateral carotid arteries: Secondary | ICD-10-CM | POA: Diagnosis not present

## 2023-11-18 DIAGNOSIS — K551 Chronic vascular disorders of intestine: Secondary | ICD-10-CM | POA: Diagnosis not present

## 2023-11-19 ENCOUNTER — Encounter: Payer: Self-pay | Admitting: Internal Medicine

## 2023-11-19 MED ORDER — AMOXICILLIN 500 MG PO TABS: ORAL_TABLET | ORAL | 3 refills | Status: DC

## 2023-11-19 NOTE — Telephone Encounter (Signed)
 Spoke to pt she has not scheduled appt as of yet but would like to make sure she has  the Amoxicillin when she does

## 2023-11-23 NOTE — Telephone Encounter (Signed)
 Spoke to pt she had picked up Amoxicillin from pharmacy

## 2023-11-25 DIAGNOSIS — R69 Illness, unspecified: Secondary | ICD-10-CM | POA: Diagnosis not present

## 2023-12-02 ENCOUNTER — Telehealth: Payer: Self-pay

## 2023-12-02 NOTE — Telephone Encounter (Signed)
 Re-faxed.

## 2023-12-02 NOTE — Telephone Encounter (Signed)
 Copied from CRM [id number]. Topic: General - Other >> Dec 01, 2023  3:50 PM Kita Perish H wrote: Reason for CRM: [facility] calling stating they received the medical clearance form back for patient but provider didn't answer one question on form. "Wants to know if patient needed pre-med prior to dental work? If so, how long" Please reach back out with information or re-fax form with question answered thanks.  [facility] [phone] fax

## 2023-12-28 ENCOUNTER — Encounter (INDEPENDENT_AMBULATORY_CARE_PROVIDER_SITE_OTHER): Payer: Self-pay

## 2024-02-29 ENCOUNTER — Ambulatory Visit: Payer: Medicare HMO | Admitting: Internal Medicine

## 2024-02-29 VITALS — BP 114/74 | HR 81 | Temp 98.0°F | Ht 67.0 in | Wt 128.6 lb

## 2024-02-29 DIAGNOSIS — R5383 Other fatigue: Secondary | ICD-10-CM

## 2024-02-29 DIAGNOSIS — E034 Atrophy of thyroid (acquired): Secondary | ICD-10-CM

## 2024-02-29 DIAGNOSIS — E785 Hyperlipidemia, unspecified: Secondary | ICD-10-CM | POA: Diagnosis not present

## 2024-02-29 DIAGNOSIS — I152 Hypertension secondary to endocrine disorders: Secondary | ICD-10-CM

## 2024-02-29 DIAGNOSIS — E1169 Type 2 diabetes mellitus with other specified complication: Secondary | ICD-10-CM | POA: Diagnosis not present

## 2024-02-29 DIAGNOSIS — Z780 Asymptomatic menopausal state: Secondary | ICD-10-CM | POA: Diagnosis not present

## 2024-02-29 DIAGNOSIS — N6321 Unspecified lump in the left breast, upper outer quadrant: Secondary | ICD-10-CM

## 2024-02-29 DIAGNOSIS — R634 Abnormal weight loss: Secondary | ICD-10-CM

## 2024-02-29 DIAGNOSIS — L247 Irritant contact dermatitis due to plants, except food: Secondary | ICD-10-CM

## 2024-02-29 DIAGNOSIS — I6523 Occlusion and stenosis of bilateral carotid arteries: Secondary | ICD-10-CM

## 2024-02-29 DIAGNOSIS — E1159 Type 2 diabetes mellitus with other circulatory complications: Secondary | ICD-10-CM

## 2024-02-29 DIAGNOSIS — E118 Type 2 diabetes mellitus with unspecified complications: Secondary | ICD-10-CM | POA: Diagnosis not present

## 2024-02-29 DIAGNOSIS — K551 Chronic vascular disorders of intestine: Secondary | ICD-10-CM

## 2024-02-29 DIAGNOSIS — Z1231 Encounter for screening mammogram for malignant neoplasm of breast: Secondary | ICD-10-CM

## 2024-02-29 LAB — COMPREHENSIVE METABOLIC PANEL WITH GFR
ALT: 14 U/L (ref 0–35)
AST: 21 U/L (ref 0–37)
Albumin: 4.4 g/dL (ref 3.5–5.2)
Alkaline Phosphatase: 83 U/L (ref 39–117)
BUN: 13 mg/dL (ref 6–23)
CO2: 32 meq/L (ref 19–32)
Calcium: 9.2 mg/dL (ref 8.4–10.5)
Chloride: 102 meq/L (ref 96–112)
Creatinine, Ser: 0.85 mg/dL (ref 0.40–1.20)
GFR: 66.14 mL/min (ref >60.00)
Glucose, Bld: 101 mg/dL — ABNORMAL HIGH (ref 70–99)
Potassium: 3.6 meq/L (ref 3.5–5.1)
Sodium: 142 meq/L (ref 135–145)
Total Bilirubin: 0.6 mg/dL (ref 0.2–1.2)
Total Protein: 6.3 g/dL (ref 6.0–8.3)

## 2024-02-29 LAB — LDL CHOLESTEROL, DIRECT: Direct LDL: 76 mg/dL

## 2024-02-29 LAB — LIPID PANEL
Cholesterol: 150 mg/dL (ref 0–200)
HDL: 54.7 mg/dL (ref >39.00)
LDL Cholesterol: 73 mg/dL (ref 0–99)
NonHDL: 95.63
Total CHOL/HDL Ratio: 3
Triglycerides: 112 mg/dL (ref 0.0–149.0)
VLDL: 22.4 mg/dL (ref 0.0–40.0)

## 2024-02-29 LAB — TSH: TSH: 1.35 u[IU]/mL (ref 0.35–5.50)

## 2024-02-29 LAB — MICROALBUMIN / CREATININE URINE RATIO
Creatinine,U: 43.2 mg/dL
Microalb Creat Ratio: 23.5 mg/g (ref 0.0–30.0)
Microalb, Ur: 1 mg/dL (ref 0.0–1.9)

## 2024-02-29 LAB — HEMOGLOBIN A1C: Hgb A1c MFr Bld: 5.9 % (ref 4.6–6.5)

## 2024-02-29 MED ORDER — ATORVASTATIN CALCIUM 40 MG PO TABS: 40.0000 mg | ORAL_TABLET | Freq: Every day | ORAL | 1 refills | Status: DC

## 2024-02-29 MED ORDER — LEVOTHYROXINE SODIUM 88 MCG PO TABS: 88.0000 ug | ORAL_TABLET | Freq: Every day | ORAL | 1 refills | Status: DC

## 2024-02-29 MED ORDER — CLOPIDOGREL BISULFATE 75 MG PO TABS: 75.0000 mg | ORAL_TABLET | Freq: Every day | ORAL | 1 refills | Status: DC

## 2024-02-29 MED ORDER — TRIAMCINOLONE ACETONIDE 0.1 % EX CREA: 1.0000 | TOPICAL_CREAM | Freq: Two times a day (BID) | CUTANEOUS | 0 refills | Status: DC

## 2024-02-29 MED ORDER — AMLODIPINE BESYLATE 2.5 MG PO TABS: 2.5000 mg | ORAL_TABLET | Freq: Every day | ORAL | 1 refills | Status: DC

## 2024-02-29 MED ORDER — PANTOPRAZOLE SODIUM 40 MG PO TBEC: 40.0000 mg | DELAYED_RELEASE_TABLET | Freq: Every day | ORAL | 1 refills | Status: DC

## 2024-02-29 MED ORDER — PREDNISONE 10 MG PO TABS: ORAL_TABLET | ORAL | 0 refills | Status: DC

## 2024-02-29 NOTE — Progress Notes (Unsigned)
 Subjective:  Patient ID: LEE-ANNE FLICKER, female    DOB: [birth year]  Age: 78 y.o. MRN: [id number]  CC: The primary encounter diagnosis was Hypertension associated with type 2 diabetes mellitus (HCC). Diagnoses of Hypothyroidism due to acquired atrophy of thyroid , Hyperlipidemia associated with type 2 diabetes mellitus (HCC), Controlled type 2 diabetes mellitus with complication, without long-term current use of insulin (HCC), Encounter for screening mammogram for malignant neoplasm of breast, and Postmenopausal estrogen deficiency were also pertinent to this visit.   HPI [name] presents for  Chief Complaint  Patient presents with   Medical Management of Chronic Issues   1) CONTACT DERMATITIS FROM POISON IVY cleaning out a neighbor's overgrown yard.   2) carotid stenosis asymptomatic . Last US  2009   3)    Outpatient Medications Prior to Visit  Medication Sig Dispense Refill   amLODipine  (NORVASC ) 2.5 MG tablet Take 1 tablet (2.5 mg total) by mouth daily. 90 tablet 1   amoxicillin  (AMOXIL ) 500 MG tablet 4 tablets by mouth one hour prior to procedure 4 tablet 3   atorvastatin  (LIPITOR) 40 MG tablet Take 1 tablet (40 mg total) by mouth daily. 901 tablet 1   clopidogrel  (PLAVIX ) 75 MG tablet Take 1 tablet (75 mg total) by mouth daily. 90 tablet 1   fluticasone  (FLONASE ) 50 MCG/ACT nasal spray Place 2 sprays into both nostrils daily. 16 g 6   hydrochlorothiazide  (HYDRODIURIL ) 25 MG tablet Take 1 tablet (25 mg total) by mouth daily. 90 tablet 3   levothyroxine  (SYNTHROID ) 88 MCG tablet Take 1 tablet (88 mcg total) by mouth daily before breakfast. 90 tablet 1   losartan  (COZAAR ) 100 MG tablet Take 1 tablet by mouth once daily 90 tablet 1   Multiple Vitamin (MULTIVITAMIN) tablet Take 1 tablet by mouth daily.     ondansetron  (ZOFRAN -ODT) 4 MG disintegrating tablet Take 4 mg by mouth every 8 (eight) hours as needed for nausea.     pantoprazole  (PROTONIX ) 40 MG tablet Take 1 tablet  (40 mg total) by mouth daily. 90 tablet 1   polyethylene glycol (MIRALAX / GLYCOLAX) 17 g packet Take 17 g by mouth daily.     No facility-administered medications prior to visit.    Review of Systems;  Patient denies headache, fevers, malaise, unintentional weight loss, skin rash, eye pain, sinus congestion and sinus pain, sore throat, dysphagia,  hemoptysis , cough, dyspnea, wheezing, chest pain, palpitations, orthopnea, edema, abdominal pain, nausea, melena, diarrhea, constipation, flank pain, dysuria, hematuria, urinary  Frequency, nocturia, numbness, tingling, seizures,  Focal weakness, Loss of consciousness,  Tremor, insomnia, depression, anxiety, and suicidal ideation.      Objective:  BP 114/74   Pulse 81   Temp 98 F (36.7 C)   Ht 5' 7 (1.702 m)   Wt 128 lb 9.6 oz (58.3 kg)   SpO2 98%   BMI 20.14 kg/m   BP Readings from Last 3 Encounters:  02/29/24 114/74  08/23/23 126/68  05/21/23 (!) 170/70    Wt Readings from Last 3 Encounters:  02/29/24 128 lb 9.6 oz (58.3 kg)  08/23/23 125 lb 12.8 oz (57.1 kg)  05/21/23 120 lb 3.2 oz (54.5 kg)    Physical Exam  Lab Results  Component Value Date   HGBA1C 6.0 08/23/2023   HGBA1C 4.5 (L) 05/21/2023   HGBA1C 5.8 10/05/2022    Lab Results  Component Value Date   CREATININE 0.76 08/23/2023   CREATININE 0.72 05/21/2023   CREATININE 0.85 03/19/2023  Lab Results  Component Value Date   WBC 5.3 08/23/2023   HGB 14.4 08/23/2023   HCT 43.5 08/23/2023   PLT 212.0 08/23/2023   GLUCOSE 76 08/23/2023   CHOL 170 08/23/2023   TRIG 127.0 08/23/2023   HDL 64.30 08/23/2023   LDLDIRECT 80.0 08/23/2023   LDLCALC 80 08/23/2023   ALT 13 08/23/2023   AST 20 08/23/2023   NA 141 08/23/2023   K 3.6 08/23/2023   CL 103 08/23/2023   CREATININE 0.76 08/23/2023   BUN 13 08/23/2023   CO2 30 08/23/2023   TSH 0.56 05/21/2023   INR 1.1 12/29/2022   HGBA1C 6.0 08/23/2023    US  LT BREAST BX W LOC DEV 1ST LESION IMG BX SPEC US   GUIDE Addendum Date: 09/20/2023 ADDENDUM REPORT: 09/20/2023 10:54 ADDENDUM: Pathology revealed STROMAL SCLEROSIS AND FOCAL FIBROADENOMATOID CHANGE WITH ASSOCIATED COARSE CALCIFICATIONS of the LEFT breast, 12 o'clock, 3cmfn, (ribbon clip). A congo red stain was performed to exclude the possibility of amyloidosis. The congo red stain is negative and stain controls worked appropriately. This was found to be concordant by Dr. Corean Salter. Pathology results were discussed with the patient by telephone. The patient reported doing well after the biopsy with tenderness at the site. Post biopsy instructions and care were reviewed and questions were answered. The patient was encouraged to call [facility] for any additional concerns. The patient was asked to return for bilateral diagnostic mammography and ultrasound in 6 months and informed a reminder notice would be sent regarding this appointment. The patient is due for annual mammography in June 2025. Pathology results reported by Hendricks Benders, RN on 09/20/2023. Electronically Signed   By: Corean Salter M.D.   On: 09/20/2023 10:54   Result Date: 09/20/2023 CLINICAL DATA:  Indeterminate LEFT breast distortion EXAM: ULTRASOUND GUIDED LEFT BREAST CORE NEEDLE BIOPSY COMPARISON:  Previous exam(s). PROCEDURE: I met with the patient and we discussed the procedure of ultrasound-guided biopsy, including benefits and alternatives. We discussed the high likelihood of a successful procedure. We discussed the risks of the procedure, including infection, bleeding, tissue injury, clip migration, and inadequate sampling. Informed written consent was given. The usual time-out protocol was performed immediately prior to the procedure. Lesion quadrant: Upper outer quadrant Using sterile technique and 1% lidocaine  and 1% lidocaine  with epinephrine  as local anesthetic, under direct ultrasound visualization, a 14 gauge spring-loaded device was  used to perform biopsy of an area at 12 o'clock 3 cm from the nipple using a lateral approach. At the conclusion of the procedure a RIBBON shaped tissue marker clip was deployed into the biopsy cavity. Follow up 2 view mammogram was performed and dictated separately. IMPRESSION: Ultrasound guided biopsy of the LEFT breast at 12 o'clock. No apparent complications. Electronically Signed: By: Corean Salter M.D. On: 09/16/2023 09:50   MM CLIP PLACEMENT LEFT Result Date: 09/16/2023 CLINICAL DATA:  Status post ultrasound-guided biopsy EXAM: 3D DIAGNOSTIC LEFT MAMMOGRAM POST ULTRASOUND BIOPSY COMPARISON:  Previous exam(s). FINDINGS: 3D Mammographic images were obtained following ultrasound guided biopsy of LEFT breast asymmetry with distortion. The RIBBON biopsy marking clip is in expected position at the site of biopsy. IMPRESSION: Appropriate positioning of the RIBBON shaped biopsy marking clip at the site of biopsy in the upper breast. Final Assessment: Post Procedure Mammograms for Marker Placement Electronically Signed   By: Corean Salter M.D.   On: 09/16/2023 08:54    Assessment & Plan:  .Hypertension associated with type 2 diabetes mellitus (HCC)  Hypothyroidism due to acquired atrophy of thyroid   Hyperlipidemia associated with type 2 diabetes mellitus (HCC)  Controlled type 2 diabetes mellitus with complication, without long-term current use of insulin (HCC)  Encounter for screening mammogram for malignant neoplasm of breast  Postmenopausal estrogen deficiency     I spent 34 minutes on the day of this face to face encounter reviewing patient's  most recent visit with cardiology,  nephrology,  and neurology,  prior relevant surgical and non surgical procedures, recent  labs and imaging studies, counseling on weight management,  reviewing the assessment and plan with patient, and post visit ordering and reviewing of  diagnostics and therapeutics with patient  .   Follow-up: No  follow-ups on file.   Verneita LITTIE Kettering, MD

## 2024-02-29 NOTE — Patient Instructions (Signed)
 Your bone density test  been ordered.  Please call to make your appointment at [place]  [phone]    Carotid ultrasound ordered. If you aren't contacted in 2 weeks ,,  CALL ME   PREDNISONE  AND TRIAMCINOLONE  OINTMENT FOR YOUR POISON IVY  The prednisone  dose is 60 mg daily for 3 days, then  begin to taper by 10 mg daily until gone   I do recommend the RSV vaccine for you, .  It is now available at your pharmacy  and will protect you against the Respiratory Syncytial Virus

## 2024-02-29 NOTE — Assessment & Plan Note (Signed)
 She was diagnosed with chronic mesenteric ischemia s/p SMA stent 01/26/23  but had abdominal pain, nausea and vomiting that did not  improve post placement of  SMA stent. Weight dropped to 115 lbs.  On 03/29/23,[name] was taken to the operating room with Dr.Williams, Dr. Lorrayne, and Dr. Dona for a supraceliac aorta-celiac bypass with a 6 mm Dacron graft.  Has done well post operatively and weight has increased by 13 lbs after reaching a nadir of 115 lbs in August

## 2024-03-01 DIAGNOSIS — L247 Irritant contact dermatitis due to plants, except food: Secondary | ICD-10-CM | POA: Insufficient documentation

## 2024-03-01 NOTE — Assessment & Plan Note (Signed)
Her weight has stabilized  

## 2024-03-01 NOTE — Assessment & Plan Note (Addendum)
 She has resumed losartan  , hydrochlorothiazide  and amlodipine    . Lab Results  Component Value Date   CREATININE 0.85 02/29/2024    Lab Results  Component Value Date   NA 142 02/29/2024   K 3.6 02/29/2024   CL 102 02/29/2024   CO2 32 02/29/2024

## 2024-03-01 NOTE — Assessment & Plan Note (Signed)
 Prednisone  taper and triamcinlone ointment prescribed

## 2024-03-02 ENCOUNTER — Ambulatory Visit: Payer: Self-pay | Admitting: Internal Medicine

## 2024-03-13 ENCOUNTER — Ambulatory Visit
Admission: RE | Admit: 2024-03-13 | Discharge: 2024-03-13 | Disposition: A | Discharge status: Home / Self Care | Source: Ambulatory Visit | Attending: Internal Medicine | Admitting: Internal Medicine

## 2024-03-13 DIAGNOSIS — I10 Essential (primary) hypertension: Secondary | ICD-10-CM | POA: Diagnosis not present

## 2024-03-13 DIAGNOSIS — I6523 Occlusion and stenosis of bilateral carotid arteries: Secondary | ICD-10-CM | POA: Insufficient documentation

## 2024-03-15 DIAGNOSIS — H43813 Vitreous degeneration, bilateral: Secondary | ICD-10-CM | POA: Diagnosis not present

## 2024-03-15 DIAGNOSIS — Z961 Presence of intraocular lens: Secondary | ICD-10-CM | POA: Diagnosis not present

## 2024-03-15 DIAGNOSIS — Z0100 Encounter for examination of eyes and vision without abnormal findings: Secondary | ICD-10-CM | POA: Diagnosis not present

## 2024-03-15 DIAGNOSIS — H26491 Other secondary cataract, right eye: Secondary | ICD-10-CM | POA: Diagnosis not present

## 2024-03-15 DIAGNOSIS — E119 Type 2 diabetes mellitus without complications: Secondary | ICD-10-CM | POA: Diagnosis not present

## 2024-03-15 LAB — HM DIABETES EYE EXAM

## 2024-04-17 ENCOUNTER — Other Ambulatory Visit: Payer: Self-pay | Admitting: Internal Medicine

## 2024-04-17 DIAGNOSIS — R928 Other abnormal and inconclusive findings on diagnostic imaging of breast: Secondary | ICD-10-CM

## 2024-04-19 ENCOUNTER — Ambulatory Visit
Admission: RE | Admit: 2024-04-19 | Discharge: 2024-04-19 | Disposition: A | Discharge status: Home / Self Care | Source: Ambulatory Visit | Attending: Internal Medicine | Admitting: Internal Medicine

## 2024-04-19 DIAGNOSIS — M85832 Other specified disorders of bone density and structure, left forearm: Secondary | ICD-10-CM | POA: Diagnosis not present

## 2024-04-19 DIAGNOSIS — R928 Other abnormal and inconclusive findings on diagnostic imaging of breast: Secondary | ICD-10-CM | POA: Insufficient documentation

## 2024-04-19 DIAGNOSIS — Z780 Asymptomatic menopausal state: Secondary | ICD-10-CM | POA: Insufficient documentation

## 2024-04-19 DIAGNOSIS — R92333 Mammographic heterogeneous density, bilateral breasts: Secondary | ICD-10-CM | POA: Diagnosis not present

## 2024-04-19 DIAGNOSIS — N611 Abscess of the breast and nipple: Secondary | ICD-10-CM | POA: Diagnosis not present

## 2024-04-21 ENCOUNTER — Other Ambulatory Visit

## 2024-04-21 ENCOUNTER — Ambulatory Visit: Payer: Self-pay | Admitting: Internal Medicine

## 2024-04-21 ENCOUNTER — Encounter

## 2024-05-22 ENCOUNTER — Other Ambulatory Visit: Payer: Self-pay

## 2024-05-22 MED ORDER — AMOXICILLIN 500 MG PO TABS: ORAL_TABLET | ORAL | 3 refills | Status: DC

## 2024-05-22 NOTE — Telephone Encounter (Signed)
 Refilled: 11/19/2023 Last OV: 02/29/2024 Next OV: 09/01/2024

## 2024-06-06 ENCOUNTER — Ambulatory Visit (INDEPENDENT_AMBULATORY_CARE_PROVIDER_SITE_OTHER): Payer: Self-pay | Admitting: *Deleted

## 2024-06-06 VITALS — Ht 67.0 in | Wt 129.0 lb

## 2024-06-06 DIAGNOSIS — Z0000 Encounter for general adult medical examination without abnormal findings: Secondary | ICD-10-CM

## 2024-06-06 NOTE — Patient Instructions (Signed)
 Ms. Gasca,  Thank you for taking the time for your Medicare Wellness Visit. I appreciate your continued commitment to your health goals. Please review the care plan we discussed, and feel free to reach out if I can assist you further.  Medicare recommends these wellness visits once per year to help you and your care team stay ahead of potential health issues. These visits are designed to focus on prevention, allowing your provider to concentrate on managing your acute and chronic conditions during your regular appointments.  Please note that Annual Wellness Visits do not include a physical exam. Some assessments may be limited, especially if the visit was conducted virtually. If needed, we may recommend a separate in-person follow-up with your provider.  Ongoing Care Seeing your primary care provider every 3 to 6 months helps us  monitor your health and provide consistent, personalized care.  Consider updating your vaccines and getting a lung cancer screening done.   Referrals If a referral was made during today's visit and you haven't received any updates within two weeks, please contact the referred provider directly to check on the status.  Recommended Screenings:  Health Maintenance  Topic Date Due   Zoster (Shingles) Vaccine (1 of 2) Never done   Screening for Lung Cancer  Never done   DTaP/Tdap/Td vaccine (2 - Td or Tdap) 05/28/2020   COVID-19 Vaccine (3 - Moderna risk series) 07/29/2020   Flu Shot  03/10/2024   Complete foot exam   08/22/2024   Hemoglobin A1C  08/31/2024   Yearly kidney function blood test for diabetes  02/28/2025   Yearly kidney health urinalysis for diabetes  02/28/2025   Eye exam for diabetics  03/15/2025   Breast Cancer Screening  04/19/2025   Medicare Annual Wellness Visit  06/06/2025   DEXA scan (bone density measurement)  04/19/2029   Pneumococcal Vaccine for age over 69  Completed   Hepatitis C Screening  Completed   Meningitis B Vaccine  Aged Out    Cologuard (Stool DNA test)  Discontinued       06/06/2024   11:28 AM  Advanced Directives  Does Patient Have a Medical Advance Directive? Yes  Type of Estate Agent of [place];Living will  Copy of Healthcare Power of Attorney in Chart? No - copy requested   Advance Care Planning is important because it: Ensures you receive medical care that aligns with your values, goals, and preferences. Provides guidance to your family and loved ones, reducing the emotional burden of decision-making during critical moments.  Vision: Annual vision screenings are recommended for early detection of glaucoma, cataracts, and diabetic retinopathy. These exams can also reveal signs of chronic conditions such as diabetes and high blood pressure.  Dental: Annual dental screenings help detect early signs of oral cancer, gum disease, and other conditions linked to overall health, including heart disease and diabetes.  Please see the attached documents for additional preventive care recommendations.

## 2024-06-06 NOTE — Progress Notes (Signed)
 Subjective:   Alexandra Copeland is a 78 y.o. who presents for a Medicare Wellness preventive visit.  As a reminder, Annual Wellness Visits don't include a physical exam, and some assessments may be limited, especially if this visit is performed virtually. We may recommend an in-person follow-up visit with your provider if needed.  Visit Complete: Virtual I connected with  Alexandra Copeland on 06/06/24 by a audio enabled telemedicine application and verified that I am speaking with the correct person using two identifiers.  Patient Location: Home  Provider Location: Home Office  I discussed the limitations of evaluation and management by telemedicine. The patient expressed understanding and agreed to proceed.  Vital Signs: Because this visit was a virtual/telehealth visit, some criteria may be missing or patient reported. Any vitals not documented were not able to be obtained and vitals that have been documented are patient reported.  VideoDeclined- This patient declined Librarian, academic. Therefore the visit was completed with audio only.  Persons Participating in Visit: Patient.  AWV Questionnaire: Yes: Patient Medicare AWV questionnaire was completed by the patient on 06/05/24; I have confirmed that all information answered by patient is correct and no changes since this date.  Cardiac Risk Factors include: advanced age (>69men, >56 women);diabetes mellitus;dyslipidemia;hypertension;smoking/ tobacco exposure     Objective:    Today's Vitals   06/06/24 1053  Weight: 129 lb (58.5 kg)  Height: 5' 7 (1.702 m)   Body mass index is 20.2 kg/m.     06/06/2024   11:28 AM 05/13/2023    1:03 PM 01/26/2023   11:14 AM 12/29/2022    1:45 PM 03/12/2022   11:40 AM 01/06/2022   10:21 AM 03/11/2021   10:55 AM  Advanced Directives  Does Patient Have a Medical Advance Directive? Yes Yes No No No No No  Type of Estate Agent of [place];Living  will Healthcare Power of [place];Living will       Does patient want to make changes to medical advance directive?  No - Patient declined       Copy of Healthcare Power of Attorney in Chart? No - copy requested No - copy requested       Would patient like information on creating a medical advance directive?   No - Patient declined No - Patient declined No - Patient declined No - Patient declined No - Patient declined    Current Medications (verified) Outpatient Encounter Medications as of 06/06/2024  Medication Sig   amLODipine  (NORVASC ) 2.5 MG tablet Take 1 tablet (2.5 mg total) by mouth daily.   amoxicillin  (AMOXIL ) 500 MG tablet 4 tablets by mouth one hour prior to procedure   atorvastatin  (LIPITOR) 40 MG tablet Take 1 tablet (40 mg total) by mouth daily.   clopidogrel  (PLAVIX ) 75 MG tablet Take 1 tablet (75 mg total) by mouth daily.   docusate sodium (COLACE) 100 MG capsule Take 100 mg by mouth daily.   fluticasone  (FLONASE ) 50 MCG/ACT nasal spray Place 2 sprays into both nostrils daily. (Patient taking differently: Place 2 sprays into both nostrils daily as needed.)   hydrochlorothiazide  (HYDRODIURIL ) 25 MG tablet Take 1 tablet (25 mg total) by mouth daily.   levothyroxine  (SYNTHROID ) 88 MCG tablet Take 1 tablet (88 mcg total) by mouth daily before breakfast.   losartan  (COZAAR ) 100 MG tablet Take 1 tablet by mouth once daily (Patient taking differently: Take 100 mg by mouth daily as needed.)   Multiple Vitamin (MULTIVITAMIN) tablet Take 1 tablet  by mouth daily.   ondansetron  (ZOFRAN -ODT) 4 MG disintegrating tablet Take 4 mg by mouth every 8 (eight) hours as needed for nausea.   pantoprazole  (PROTONIX ) 40 MG tablet Take 1 tablet (40 mg total) by mouth daily.   triamcinolone  cream (KENALOG ) 0.1 % Apply 1 Application topically 2 (two) times daily. For severe contact dermatitis (Patient taking differently: Apply 1 Application topically 2 (two) times daily as needed. For severe contact  dermatitis)   polyethylene glycol (MIRALAX / GLYCOLAX) 17 g packet Take 17 g by mouth daily. (Patient not taking: Reported on 06/06/2024)   predniSONE  (DELTASONE ) 10 MG tablet 6 tablets daily for 3 days, then reduce by 1 tablet daily until gone (Patient not taking: Reported on 06/06/2024)   No facility-administered encounter medications on file as of 06/06/2024.    Allergies (verified) Patient has no known allergies.   History: Past Medical History:  Diagnosis Date   Anemia associated with acute blood loss 05/23/2023   Cataract bil removed 2021   Chronic mesenteric ischemia 03/14/2023   Chronic sinus bradycardia 11/30/2022   Constipation 12/31/2022   DM (diabetes mellitus) type 2, uncontrolled, with ketoacidosis (HCC) 03/14/2023   GERD (gastroesophageal reflux disease) 03/26/2022   History of tobacco abuse 02/05/2018   Hyperlipidemia    Hypertension    Hypothyroid    Mesenteric artery stenosis 03/14/2023   Scarlet fever    as a child   Stroke (HCC) 01/2009   left parietal hypertensive   Past Surgical History:  Procedure Laterality Date   BREAST BIOPSY Left 09/16/2023   US  Core, Ribbon clip- STROMAL SCLEROSIS AND FOCAL FIBROADENOMATOID CHANGE WITH ASSOCIATED COARSE CALCIFICATIONS of the LEFT breast, 12 o'clock, 3cmfn, (ribbon clip).   BREAST BIOPSY Left 09/16/2023   US  LT BREAST BX W LOC DEV 1ST LESION IMG BX SPEC US  GUIDE 09/16/2023 [facility]   BREAST CYST ASPIRATION Left 1978   neg   BREAST SURGERY  1975   abscess drained   CATARACT EXTRACTION W/PHACO Left 02/20/2020   Procedure: CATARACT EXTRACTION PHACO AND INTRAOCULAR LENS PLACEMENT (IOC) LEFT DIABETIC 6.77 00:41.8;  Surgeon: Myrna Adine Anes, MD;  Location: [facility];  Service: Ophthalmology;  Laterality: Left;   CATARACT EXTRACTION W/PHACO Right 03/18/2020   Procedure: CATARACT EXTRACTION PHACO AND INTRAOCULAR LENS PLACEMENT (IOC) RIGHT DIABETIC 9.71  01:00.9;  Surgeon: Myrna Adine Anes, MD;   Location: [facility];  Service: Ophthalmology;  Laterality: Right;   COLONOSCOPY WITH PROPOFOL  N/A 01/06/2022   Procedure: COLONOSCOPY WITH PROPOFOL ;  Surgeon: Therisa Bi, MD;  Location: [facility];  Service: Gastroenterology;  Laterality: N/A;   EYE SURGERY  2021   cataracts removed   mescencary aortic bypass  03/2023   [facility]   mescencary aortic bypasss     TUBAL LIGATION     VISCERAL ANGIOGRAPHY N/A 01/26/2023   Procedure: VISCERAL ANGIOGRAPHY;  Surgeon: Jama Cordella MATSU, MD;  Location: [facility] INVASIVE CV LAB;  Service: Cardiovascular;  Laterality: N/A;   Family History  Problem Relation Age of Onset   Heart disease Mother    Hypertension Mother    Stroke Father    Hyperlipidemia Father    Vision loss Father    Hypertension Brother    Rheum arthritis Brother    Diabetes Paternal Grandmother    Breast cancer Neg Hx    Social History   Socioeconomic History   Marital status: Divorced    Spouse name: Not on file   Number of children: 3   Years of education: Not on  file   Highest education level: Bachelor's degree (e.g., BA, AB, BS)  Occupational History   Not on file  Tobacco Use   Smoking status: Every Day    Current packs/day: 0.00    Average packs/day: 1 pack/day for 36.1 years (36.1 ttl pk-yrs)    Types: Cigarettes    Start date: 14    Last attempt to quit: 10/2022    Years since quitting: 1.6   Smokeless tobacco: Never   Tobacco comments:    quit for several years.  Has recently started back. Quit again 09/2022    Has started back 06/06/24  Vaping Use   Vaping status: Never Used  Substance and Sexual Activity   Alcohol use: Not Currently    Alcohol/week: 1.0 standard drink of alcohol    Comment: rare occasion   Drug use: Not Currently    Comment: one valium  a day   Sexual activity: Not Currently  Other Topics Concern   Not on file  Social History Narrative   Not on file   Social Drivers of Health   Financial Resource Strain: Low Risk   (06/05/2024)   Overall Financial Resource Strain (CARDIA)    Difficulty of Paying Living Expenses: Not very hard  Food Insecurity: No Food Insecurity (06/05/2024)   Hunger Vital Sign    Worried About Running Out of Food in the Last Year: Never true    Ran Out of Food in the Last Year: Never true  Transportation Needs: No Transportation Needs (06/05/2024)   PRAPARE - Administrator, Civil Service (Medical): No    Lack of Transportation (Non-Medical): No  Physical Activity: Sufficiently Active (06/06/2024)   Exercise Vital Sign    Days of Exercise per Week: 7 days    Minutes of Exercise per Session: 60 min  Stress: Stress Concern Present (06/05/2024)   [employer] of Occupational Health - Occupational Stress Questionnaire    Feeling of Stress: Rather much  Social Connections: Moderately Isolated (06/05/2024)   Social Connection and Isolation Panel    Frequency of Communication with Friends and Family: Three times a week    Frequency of Social Gatherings with Friends and Family: Once a week    Attends Religious Services: More than 4 times per year    Active Member of [employer] or Organizations: No    Attends Banker Meetings: Never    Marital Status: Divorced    Tobacco Counseling Ready to quit: No Counseling given: Not Answered Tobacco comments: quit for several years.  Has recently started back. Quit again 09/2022 Has started back 06/06/24    Clinical Intake:  Pre-visit preparation completed: Yes  Pain : No/denies pain     BMI - recorded: 20.2 Nutritional Status: BMI of 19-24  Normal Nutritional Risks: None Diabetes: Yes CBG done?: No Did pt. bring in CBG monitor from home?: No  Lab Results  Component Value Date   HGBA1C 5.9 02/29/2024   HGBA1C 6.0 08/23/2023   HGBA1C 4.5 (L) 05/21/2023     How often do you need to have someone help you when you read instructions, pamphlets, or other written materials from your doctor or pharmacy?: 1 -  Never  Interpreter Needed?: No  Information entered by :: R. Temperance Kelemen LPN   Activities of Daily Living     06/06/2024   10:57 AM  In your present state of health, do you have any difficulty performing the following activities:  Hearing? 0  Vision? 0  Difficulty concentrating or  making decisions? 1  Walking or climbing stairs? 0  Dressing or bathing? 0  Doing errands, shopping? 0  Preparing Food and eating ? N  Using the Toilet? N  In the past six months, have you accidently leaked urine? N  Do you have problems with loss of bowel control? N  Managing your Medications? N  Managing your Finances? N  Housekeeping or managing your Housekeeping? N    Patient Care Team: Marylynn Verneita CROME, MD as PCP - General (Internal Medicine) Darliss Rogue, MD as PCP - Cardiology (Cardiology) Myrna Adine Anes, MD as Consulting Physician (Ophthalmology) Alona Coletta Redbird, PA (Physician Assistant)  I have updated your Care Teams any recent Medical Services you may have received from other providers in the past year.     Assessment:   This is a routine wellness examination for [place].  Hearing/Vision screen Hearing Screening - Comments:: No issues Vision Screening - Comments:: glasses   Goals Addressed             This Visit's Progress    Patient Stated       Wants to get back in the swim class        Depression Screen     06/06/2024   11:10 AM 02/29/2024   10:33 AM 08/23/2023   10:51 AM 05/13/2023    1:00 PM 12/11/2022   11:15 AM 10/05/2022   10:04 AM 03/26/2022   10:06 AM  PHQ 2/9 Scores  PHQ - 2 Score 2 0 2 0 3  0  PHQ- 9 Score 6 1 7 1 9     Exception Documentation      Patient refusal     Fall Risk     06/06/2024   11:03 AM 02/29/2024   10:33 AM 08/23/2023   10:50 AM 05/21/2023   11:08 AM 05/13/2023    1:05 PM  Fall Risk   Falls in the past year? 0 0 0 0 0  Number falls in past yr: 0 0 0 0 0  Injury with Fall? 0 0 0 0 0  Risk for fall due to : No Fall Risks No  Fall Risks No Fall Risks No Fall Risks No Fall Risks  Follow up Falls evaluation completed;Falls prevention discussed Falls evaluation completed Falls evaluation completed Falls evaluation completed Falls prevention discussed    MEDICARE RISK AT HOME:  Medicare Risk at Home Any stairs in or around the home?: No If so, are there any without handrails?: No Home free of loose throw rugs in walkways, pet beds, electrical cords, etc?: Yes Adequate lighting in your home to reduce risk of falls?: Yes Life alert?: No Use of a cane, walker or w/c?: No Grab bars in the bathroom?: Yes Shower chair or bench in shower?: Yes Elevated toilet seat or a handicapped toilet?: Yes  TIMED UP AND GO:  Was the test performed?  No  Cognitive Function: 6CIT completed    02/22/2020   11:29 AM 12/16/2017    9:46 AM 12/10/2016   10:46 AM 12/11/2015   10:21 AM  MMSE - Mini Mental State Exam  Not completed: Unable to complete     Orientation to time  5 5  5    Orientation to Place  5 5  5    Registration  3 3  3    Attention/ Calculation  5 5  5    Recall  3 3  3    Language- name 2 objects  2 2  2    Language- repeat  1 1 1   Language- follow 3 step command  3 3  3    Language- read & follow direction  1 1  1    Write a sentence  1 1  1    Copy design  1 1  1    Total score  30 30  30       Data saved with a previous flowsheet row definition        06/06/2024   11:30 AM 05/13/2023    1:07 PM 02/21/2019    1:11 PM  6CIT Screen  What Year? 0 points 0 points 0 points  What month? 0 points 0 points 0 points  What time? 0 points 0 points 0 points  Count back from 20 0 points 0 points 0 points  Months in reverse 0 points 0 points 0 points  Repeat phrase 0 points 0 points 0 points  Total Score 0 points 0 points 0 points    Immunizations Immunization History  Administered Date(s) Administered   INFLUENZA, HIGH DOSE SEASONAL PF 08/11/2018   Influenza,inj,Quad PF,6+ Mos 05/24/2013, 04/30/2014   Moderna  SARS-COV2 Booster Vaccination 07/01/2020   Moderna Sars-Covid-2 Vaccination 10/02/2019, 10/31/2019   PPD Test 09/18/2014   Pneumococcal Conjugate-13 05/28/2014   Pneumococcal Polysaccharide-23 05/29/2010, 08/18/2018   Tdap 05/28/2010    Screening Tests Health Maintenance  Topic Date Due   Zoster Vaccines- Shingrix (1 of 2) Never done   Lung Cancer Screening  Never done   DTaP/Tdap/Td (2 - Td or Tdap) 05/28/2020   COVID-19 Vaccine (3 - Moderna risk series) 07/29/2020   Influenza Vaccine  03/10/2024   Medicare Annual Wellness (AWV)  05/12/2024   FOOT EXAM  08/22/2024   HEMOGLOBIN A1C  08/31/2024   Diabetic kidney evaluation - eGFR measurement  02/28/2025   Diabetic kidney evaluation - Urine ACR  02/28/2025   OPHTHALMOLOGY EXAM  03/15/2025   Mammogram  04/19/2025   DEXA SCAN  04/19/2029   Pneumococcal Vaccine: 50+ Years  Completed   Hepatitis C Screening  Completed   Meningococcal B Vaccine  Aged Out   Fecal DNA (Cologuard)  Discontinued    Health Maintenance Items Addressed:  Patient declines vaccines and lung cancer screening.   Additional Screening:  Vision Screening: Recommended annual ophthalmology exams for early detection of glaucoma and other disorders of the eye. Is the patient up to date with their annual eye exam?  Yes  Who is the provider or what is the name of the office in which the patient attends annual eye exams?  [facility]  Dental Screening: Recommended annual dental exams for proper oral hygiene  Community Resource Referral / Chronic Care Management: CRR required this visit?  No   CCM required this visit?  No   Plan:    I have personally reviewed and noted the following in the patient's chart:   Medical and social history Use of alcohol, tobacco or illicit drugs  Current medications and supplements including opioid prescriptions. Patient is not currently taking opioid prescriptions. Functional ability and status Nutritional status Physical  activity Advanced directives List of other physicians Hospitalizations, surgeries, and ER visits in previous 12 months Vitals Screenings to include cognitive, depression, and falls Referrals and appointments  In addition, I have reviewed and discussed with patient certain preventive protocols, quality metrics, and best practice recommendations. A written personalized care plan for preventive services as well as general preventive health recommendations were provided to patient.   Angeline Fredericks, LPN   [id number]   After Visit Summary: (  MyChart) Due to this being a telephonic visit, the after visit summary with patients personalized plan was offered to patient via MyChart   Notes: Nothing significant to report at this time.

## 2024-07-08 ENCOUNTER — Other Ambulatory Visit: Payer: Self-pay | Admitting: Internal Medicine

## 2024-08-30 ENCOUNTER — Other Ambulatory Visit

## 2024-08-30 DIAGNOSIS — E1159 Type 2 diabetes mellitus with other circulatory complications: Secondary | ICD-10-CM

## 2024-08-30 DIAGNOSIS — I152 Hypertension secondary to endocrine disorders: Secondary | ICD-10-CM

## 2024-08-30 DIAGNOSIS — E785 Hyperlipidemia, unspecified: Secondary | ICD-10-CM

## 2024-08-30 DIAGNOSIS — R5383 Other fatigue: Secondary | ICD-10-CM

## 2024-08-30 DIAGNOSIS — E1169 Type 2 diabetes mellitus with other specified complication: Secondary | ICD-10-CM

## 2024-08-30 LAB — TSH: TSH: 3.26 u[IU]/mL (ref 0.35–5.50)

## 2024-08-30 LAB — COMPREHENSIVE METABOLIC PANEL WITH GFR
ALT: 16 U/L (ref 3–35)
AST: 25 U/L (ref 5–37)
Albumin: 4.6 g/dL (ref 3.5–5.2)
Alkaline Phosphatase: 98 U/L (ref 39–117)
BUN: 14 mg/dL (ref 6–23)
CO2: 29 meq/L (ref 19–32)
Calcium: 9.6 mg/dL (ref 8.4–10.5)
Chloride: 100 meq/L (ref 96–112)
Creatinine, Ser: 0.87 mg/dL (ref 0.40–1.20)
GFR: 64.09 mL/min
Glucose, Bld: 138 mg/dL — ABNORMAL HIGH (ref 70–99)
Potassium: 3.6 meq/L (ref 3.5–5.1)
Sodium: 139 meq/L (ref 135–145)
Total Bilirubin: 0.7 mg/dL (ref 0.2–1.2)
Total Protein: 7.1 g/dL (ref 6.0–8.3)

## 2024-08-30 LAB — CBC WITH DIFFERENTIAL/PLATELET
Basophils Absolute: 0.1 K/uL (ref 0.0–0.1)
Basophils Relative: 1.2 % (ref 0.0–3.0)
Eosinophils Absolute: 0.1 K/uL (ref 0.0–0.7)
Eosinophils Relative: 2.5 % (ref 0.0–5.0)
HCT: 43 % (ref 36.0–46.0)
Hemoglobin: 14.4 g/dL (ref 12.0–15.0)
Lymphocytes Relative: 19.3 % (ref 12.0–46.0)
Lymphs Abs: 0.9 K/uL (ref 0.7–4.0)
MCHC: 33.4 g/dL (ref 30.0–36.0)
MCV: 79.4 fl (ref 78.0–100.0)
Monocytes Absolute: 0.4 K/uL (ref 0.1–1.0)
Monocytes Relative: 7.8 % (ref 3.0–12.0)
Neutro Abs: 3.3 K/uL (ref 1.4–7.7)
Neutrophils Relative %: 69.2 % (ref 43.0–77.0)
Platelets: 197 K/uL (ref 150.0–400.0)
RBC: 5.42 Mil/uL — ABNORMAL HIGH (ref 3.87–5.11)
RDW: 15.7 % — ABNORMAL HIGH (ref 11.5–15.5)
WBC: 4.7 K/uL (ref 4.0–10.5)

## 2024-08-31 LAB — LIPID PANEL W/REFLEX DIRECT LDL
Cholesterol: 182 mg/dL
HDL: 60 mg/dL
LDL Cholesterol (Calc): 95 mg/dL
Non-HDL Cholesterol (Calc): 122 mg/dL
Total CHOL/HDL Ratio: 3 (calc)
Triglycerides: 171 mg/dL — ABNORMAL HIGH

## 2024-09-01 ENCOUNTER — Encounter: Payer: Self-pay | Admitting: Internal Medicine

## 2024-09-01 ENCOUNTER — Ambulatory Visit: Admitting: Internal Medicine

## 2024-09-01 VITALS — BP 130/80 | HR 59 | Temp 97.7°F | Ht 67.0 in | Wt 136.8 lb

## 2024-09-01 DIAGNOSIS — E1169 Type 2 diabetes mellitus with other specified complication: Secondary | ICD-10-CM

## 2024-09-01 DIAGNOSIS — E785 Hyperlipidemia, unspecified: Secondary | ICD-10-CM | POA: Diagnosis not present

## 2024-09-01 DIAGNOSIS — E118 Type 2 diabetes mellitus with unspecified complications: Secondary | ICD-10-CM

## 2024-09-01 DIAGNOSIS — I152 Hypertension secondary to endocrine disorders: Secondary | ICD-10-CM | POA: Diagnosis not present

## 2024-09-01 DIAGNOSIS — E034 Atrophy of thyroid (acquired): Secondary | ICD-10-CM

## 2024-09-01 DIAGNOSIS — Z0000 Encounter for general adult medical examination without abnormal findings: Secondary | ICD-10-CM

## 2024-09-01 DIAGNOSIS — F321 Major depressive disorder, single episode, moderate: Secondary | ICD-10-CM | POA: Diagnosis not present

## 2024-09-01 DIAGNOSIS — E1159 Type 2 diabetes mellitus with other circulatory complications: Secondary | ICD-10-CM | POA: Diagnosis not present

## 2024-09-01 DIAGNOSIS — Z716 Tobacco abuse counseling: Secondary | ICD-10-CM | POA: Diagnosis not present

## 2024-09-01 LAB — POCT GLYCOSYLATED HEMOGLOBIN (HGB A1C): Hemoglobin A1C: 5.9 % — AB (ref 4.0–5.6)

## 2024-09-01 LAB — MICROALBUMIN / CREATININE URINE RATIO
Creatinine,U: 46.5 mg/dL
Microalb Creat Ratio: 16.4 mg/g (ref 0.0–30.0)
Microalb, Ur: 0.8 mg/dL (ref 0.7–1.9)

## 2024-09-01 MED ORDER — ATORVASTATIN CALCIUM 80 MG PO TABS: 80.0000 mg | ORAL_TABLET | Freq: Every day | ORAL | 1 refills | Status: AC

## 2024-09-01 MED ORDER — BUPROPION HCL ER (SR) 100 MG PO TB12: 100.0000 mg | ORAL_TABLET | Freq: Every day | ORAL | 2 refills | Status: AC

## 2024-09-01 MED ORDER — PANTOPRAZOLE SODIUM 40 MG PO TBEC: 40.0000 mg | DELAYED_RELEASE_TABLET | Freq: Every day | ORAL | 1 refills | Status: AC

## 2024-09-01 MED ORDER — AMLODIPINE BESYLATE 2.5 MG PO TABS: 2.5000 mg | ORAL_TABLET | Freq: Every day | ORAL | 1 refills | Status: AC

## 2024-09-01 MED ORDER — CLOPIDOGREL BISULFATE 75 MG PO TABS: 75.0000 mg | ORAL_TABLET | Freq: Every day | ORAL | 1 refills | Status: AC

## 2024-09-01 MED ORDER — LEVOTHYROXINE SODIUM 88 MCG PO TABS: 88.0000 ug | ORAL_TABLET | Freq: Every day | ORAL | 1 refills | Status: AC

## 2024-09-01 NOTE — Progress Notes (Unsigned)
 Patient ID: Alexandra Copeland, female    DOB: 1946/04/22  Age: 79 y.o. MRN: [id number]  The patient is here for annual preventive examination and management of other chronic and acute problems.   The risk factors are reflected in the social history.  The roster of all physicians providing medical care to patient - is listed in the Snapshot section of the chart.  Activities of daily living:  The patient is 100% independent in all ADLs: dressing, toileting, feeding as well as independent mobility  Home safety : The patient has smoke detectors in the home. They wear seatbelts.  There are no firearms at home. There is no violence in the home.   There is no risks for hepatitis, STDs or HIV. There is no   history of blood transfusion. They have no travel history to infectious disease endemic areas of the world.  The patient has seen their dentist in the last six month. They have seen their eye doctor in the last year. They admit to slight hearing difficulty with regard to whispered voices and some television programs.  They have deferred audiologic testing in the last year.  They do not  have excessive sun exposure. Discussed the need for sun protection: hats, long sleeves and use of sunscreen if there is significant sun exposure.   Diet: the importance of a healthy diet is discussed. They do have a healthy diet.  The benefits of regular aerobic exercise were discussed. She walks 4 times per week ,  20 minutes.   Depression screen: there are no signs or vegative symptoms of depression- irritability, change in appetite, anhedonia, sadness/tearfullness.  Cognitive assessment: the patient manages all their financial and personal affairs and is actively engaged. They could relate day,date,year and events; recalled 2/3 objects at 3 minutes; performed clock-face test normally.  The following portions of the patient's history were reviewed and updated as appropriate: allergies, current medications, past  family history, past medical history,  past surgical history, past social history  and problem list.  Visual acuity was not assessed per patient preference since she has regular follow up with her ophthalmologist. Hearing and body mass index were assessed and reviewed.   During the course of the visit the patient was educated and counseled about appropriate screening and preventive services including : fall prevention , diabetes screening, nutrition counseling, colorectal cancer screening, and recommended immunizations.    CC: The primary encounter diagnosis was Controlled diabetes mellitus type 2 with complications (HCC). Diagnoses of Hypothyroidism due to acquired atrophy of thyroid , Major depressive disorder, single episode, moderate (HCC), Hyperlipidemia associated with type 2 diabetes mellitus (HCC), Tobacco abuse counseling, and Hypertension associated with type 2 diabetes mellitus (HCC) were also pertinent to this visit.   Has resumed smoking.  currrent tobacco abuse since July   1 pack daily   Depressive symptoms occur after visiting family and friends . Alone,  no local friends,  Divorced remotely,   Kids are in [place] , [place], and [place]  , grandkids are in academy and college.    Walking 2-3 miles daily.    Insomnia:  retired  [employer], x 13 yrs,  but still on night shift  and awake until 2-3 am.  Nap in he aftermoon < 1 hour   History Arelys has a past medical history of Anemia associated with acute blood loss (05/23/2023), Cataract (bil removed 2021), Chronic mesenteric ischemia (03/14/2023), Chronic sinus bradycardia (11/30/2022), Constipation (12/31/2022), DM (diabetes mellitus) type 2, uncontrolled, with ketoacidosis (HCC) (03/14/2023), GERD (  gastroesophageal reflux disease) (03/26/2022), History of tobacco abuse (02/05/2018), Hyperlipidemia, Hypertension, Hypothyroid, Mesenteric artery stenosis (03/14/2023), Scarlet fever, and Stroke (HCC) (01/2009).   She has a past surgical  history that includes Breast surgery (1975); Breast cyst aspiration (Left, 1978); Tubal ligation; Cataract extraction w/PHACO (Left, 02/20/2020); Cataract extraction w/PHACO (Right, 03/18/2020); Colonoscopy with propofol  (N/A, 01/06/2022); VISCERAL ANGIOGRAPHY (N/A, 01/26/2023); Eye surgery (2021); Breast biopsy (Left, 09/16/2023); Breast biopsy (Left, 09/16/2023); mescencary aortic bypasss; and mescencary aortic bypass (03/2023).   Her family history includes Diabetes in her paternal grandmother; Heart disease in her mother; Hyperlipidemia in her father; Hypertension in her brother and mother; Rheum arthritis in her brother; Stroke in her father; Vision loss in her father.She reports that she has been smoking cigarettes. She started smoking about 38 years ago. She has a 36.1 pack-year smoking history. She has never used smokeless tobacco. She reports that she does not currently use alcohol after a past usage of about 1.0 standard drink of alcohol per week. She reports that she does not currently use drugs.  Outpatient Medications Prior to Visit  Medication Sig Dispense Refill   docusate sodium (COLACE) 100 MG capsule Take 100 mg by mouth daily.     hydrochlorothiazide  (HYDRODIURIL ) 25 MG tablet Take 1 tablet by mouth once daily 90 tablet 1   losartan  (COZAAR ) 100 MG tablet Take 1 tablet by mouth once daily (Patient taking differently: Take 100 mg by mouth daily as needed.) 90 tablet 1   ondansetron  (ZOFRAN -ODT) 4 MG disintegrating tablet Take 4 mg by mouth every 8 (eight) hours as needed for nausea.     fluticasone  (FLONASE ) 50 MCG/ACT nasal spray Place 2 sprays into both nostrils daily. (Patient taking differently: Place 2 sprays into both nostrils daily as needed.) 16 g 6   triamcinolone  cream (KENALOG ) 0.1 % Apply 1 Application topically 2 (two) times daily. For severe contact dermatitis (Patient taking differently: Apply 1 Application topically 2 (two) times daily as needed. For severe contact  dermatitis) 80 g 0   amLODipine  (NORVASC ) 2.5 MG tablet Take 1 tablet (2.5 mg total) by mouth daily. 90 tablet 1   amoxicillin  (AMOXIL ) 500 MG tablet 4 tablets by mouth one hour prior to procedure 4 tablet 3   atorvastatin  (LIPITOR) 40 MG tablet Take 1 tablet (40 mg total) by mouth daily. 901 tablet 1   clopidogrel  (PLAVIX ) 75 MG tablet Take 1 tablet (75 mg total) by mouth daily. 90 tablet 1   levothyroxine  (SYNTHROID ) 88 MCG tablet Take 1 tablet (88 mcg total) by mouth daily before breakfast. 90 tablet 1   Multiple Vitamin (MULTIVITAMIN) tablet Take 1 tablet by mouth daily.     pantoprazole  (PROTONIX ) 40 MG tablet Take 1 tablet (40 mg total) by mouth daily. 90 tablet 1   polyethylene glycol (MIRALAX / GLYCOLAX) 17 g packet Take 17 g by mouth daily. (Patient not taking: Reported on 06/06/2024)     predniSONE  (DELTASONE ) 10 MG tablet 6 tablets daily for 3 days, then reduce by 1 tablet daily until gone (Patient not taking: Reported on 06/06/2024) 33 tablet 0   No facility-administered medications prior to visit.    Review of Systems  Patient denies headache, fevers, malaise, unintentional weight loss, skin rash, eye pain, sinus congestion and sinus pain, sore throat, dysphagia,  hemoptysis , cough, dyspnea, wheezing, chest pain, palpitations, orthopnea, edema, abdominal pain, nausea, melena, diarrhea, constipation, flank pain, dysuria, hematuria, urinary  Frequency, nocturia, numbness, tingling, seizures,  Focal weakness, Loss of consciousness,  Tremor, insomnia,  depression, anxiety, and suicidal ideation.    Objective:  BP 130/80 (BP Location: Left Arm, Patient Position: Sitting)   Pulse (!) 59   Temp 97.7 F (36.5 C) (Oral)   Ht 5' 7 (1.702 m)   Wt 136 lb 12.8 oz (62.1 kg)   SpO2 99%   BMI 21.43 kg/m   Physical Exam Vitals reviewed.  Constitutional:      General: She is not in acute distress.    Appearance: Normal appearance. She is normal weight. She is not ill-appearing,  toxic-appearing or diaphoretic.  HENT:     Head: Normocephalic.  Eyes:     General: No scleral icterus.       Right eye: No discharge.        Left eye: No discharge.     Conjunctiva/sclera: Conjunctivae normal.  Cardiovascular:     Rate and Rhythm: Normal rate and regular rhythm.     Heart sounds: Normal heart sounds.  Pulmonary:     Effort: Pulmonary effort is normal. No respiratory distress.     Breath sounds: Normal breath sounds.  Musculoskeletal:        General: Normal range of motion.  Skin:    General: Skin is warm and dry.  Neurological:     General: No focal deficit present.     Mental Status: She is alert and oriented to person, place, and time. Mental status is at baseline.  Psychiatric:        Mood and Affect: Mood normal.        Behavior: Behavior normal.        Thought Content: Thought content normal.        Judgment: Judgment normal.     Assessment & Plan:  Controlled diabetes mellitus type 2 with complications (HCC) Assessment & Plan: well-controlled on diet alone.. Patient  Has had  urine microalbumin to creatinine ratio done within the past year. Patient is tolerating statin therapy for CAD risk reduction and on ACE/ARB for reduction in proteinuria.    Lab Results  Component Value Date   HGBA1C 5.9 (A) 09/01/2024   Lab Results  Component Value Date   MICROALBUR 0.8 09/01/2024   MICROALBUR 1.0 02/29/2024       Orders: -     Microalbumin / creatinine urine ratio -     POCT glycosylated hemoglobin (Hb A1C)  Hypothyroidism due to acquired atrophy of thyroid  Assessment & Plan: Thyroid  function is WNL on 88 mcg levothyroxine   No current changes needed.   Lab Results  Component Value Date   TSH 3.26 08/30/2024     Orders: -     Levothyroxine  Sodium; Take 1 tablet (88 mcg total) by mouth daily before breakfast.  Dispense: 90 tablet; Refill: 1  Major depressive disorder, single episode, moderate (HCC) Assessment & Plan: ADDING WELLBUTRIN  today  for symptoms    Hyperlipidemia associated with type 2 diabetes mellitus (HCC) Assessment & Plan: INCREASING HER ATORVASTATIN  TO 80 MG DAILY FOR GOAL LDL 70 OR LESS. A1C PENDING    Tobacco abuse counseling Assessment & Plan: Risks of continued tobacco use were discussed. .  ADDING WELLBUTRIN     Hypertension associated with type 2 diabetes mellitus (HCC) Assessment & Plan: She has resumed losartan  , hydrochlorothiazide  and amlodipine    . Lab Results  Component Value Date   CREATININE 0.87 08/30/2024    Lab Results  Component Value Date   NA 139 08/30/2024   K 3.6 08/30/2024   CL 100 08/30/2024   CO2 29  08/30/2024      Other orders -     amLODIPine  Besylate; Take 1 tablet (2.5 mg total) by mouth daily.  Dispense: 90 tablet; Refill: 1 -     Atorvastatin  Calcium ; Take 1 tablet (80 mg total) by mouth daily.  Dispense: 90 tablet; Refill: 1 -     Clopidogrel  Bisulfate; Take 1 tablet (75 mg total) by mouth daily.  Dispense: 90 tablet; Refill: 1 -     Pantoprazole  Sodium; Take 1 tablet (40 mg total) by mouth daily.  Dispense: 90 tablet; Refill: 1 -     buPROPion  HCl ER (SR); Take 1 tablet (100 mg total) by mouth daily.  Dispense: 30 tablet; Refill: 2      I provided 40 minutes of  face-to-face time during this encounter reviewing patient's current problems and past surgeries,  recent labs and imaging studies, providing counseling on the above mentioned problems , and coordination  of care .   Follow-up: Return in about 3 months (around 11/30/2024) for DEPRESSION.   Verneita LITTIE Kettering, MD

## 2024-09-01 NOTE — Assessment & Plan Note (Signed)
 INCREASING HER ATORVASTATIN  TO 80 MG DAILY FOR GOAL LDL 70 OR LESS. A1C PENDING

## 2024-09-01 NOTE — Patient Instructions (Addendum)
 Try reducing colace to as needed since your stools are too soft.  Trial of wellbutrin  once daily in the morning for depression /tobacco .  If it helps,  but aggravates anxiety,  we can add   lexapro    Try using beano before any meal with any beans , cabbage,  Broccoli, cauliflower and brussel sprouts   You can also try adding a probiotic   Stop protonix    INCREAES YOUR ATORVASTATIN  DOSE TO 80 MG   No more pre procedure antibiotics!  Your 2024 ECHO confirms no significant valve issues    If you have protein in your urine,  you will need to stop the amlodipine  and start taking the losartan  DAILY

## 2024-09-01 NOTE — Assessment & Plan Note (Signed)
 ADDING WELLBUTRIN  today for symptoms

## 2024-09-01 NOTE — Assessment & Plan Note (Signed)
 Risks of continued tobacco use were discussed. .  ADDING WELLBUTRIN 

## 2024-09-03 ENCOUNTER — Encounter: Payer: Self-pay | Admitting: Internal Medicine

## 2024-09-03 NOTE — Assessment & Plan Note (Signed)
 She has resumed losartan  , hydrochlorothiazide  and amlodipine    . Lab Results  Component Value Date   CREATININE 0.87 08/30/2024    Lab Results  Component Value Date   NA 139 08/30/2024   K 3.6 08/30/2024   CL 100 08/30/2024   CO2 29 08/30/2024

## 2024-09-03 NOTE — Assessment & Plan Note (Signed)
 Thyroid  function is WNL on 88 mcg levothyroxine   No current changes needed.   Lab Results  Component Value Date   TSH 3.26 08/30/2024

## 2024-09-03 NOTE — Assessment & Plan Note (Signed)
 well-controlled on diet alone.. Patient  Has had  urine microalbumin to creatinine ratio done within the past year. Patient is tolerating statin therapy for CAD risk reduction and on ACE/ARB for reduction in proteinuria.    Lab Results  Component Value Date   HGBA1C 5.9 (A) 09/01/2024   Lab Results  Component Value Date   MICROALBUR 0.8 09/01/2024   MICROALBUR 1.0 02/29/2024

## 2024-09-03 NOTE — Assessment & Plan Note (Signed)

## 2024-10-18 ENCOUNTER — Other Ambulatory Visit

## 2024-10-18 ENCOUNTER — Encounter

## 2024-11-30 ENCOUNTER — Ambulatory Visit: Admitting: Internal Medicine

## 2025-06-12 ENCOUNTER — Ambulatory Visit
